# Patient Record
Sex: Female | Born: 2000 | Race: Black or African American | Hispanic: No | Smoking: Never smoker
Health system: Southern US, Community
[De-identification: ages and names within clinical notes are randomized; demographics above are authoritative.]

## PROBLEM LIST (undated history)

## (undated) DIAGNOSIS — K5900 Constipation, unspecified: Secondary | ICD-10-CM

## (undated) DIAGNOSIS — J45909 Unspecified asthma, uncomplicated: Secondary | ICD-10-CM

## (undated) HISTORY — PX: COLONOSCOPY: SHX174

## (undated) HISTORY — DX: Constipation, unspecified: K59.00

---
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
## 2019-03-03 IMAGING — US US ABDOMEN COMPLETE
1 series · 14 of 25 positions shown · non-contrast
Comparison: None.

CLINICAL DATA: Right upper quadrant pain

EXAM:
ABDOMEN ULTRASOUND COMPLETE

[Series 1: us abdomen complete · 0.12mm/px · 14 of 69 slices shown]
[im 1/69]
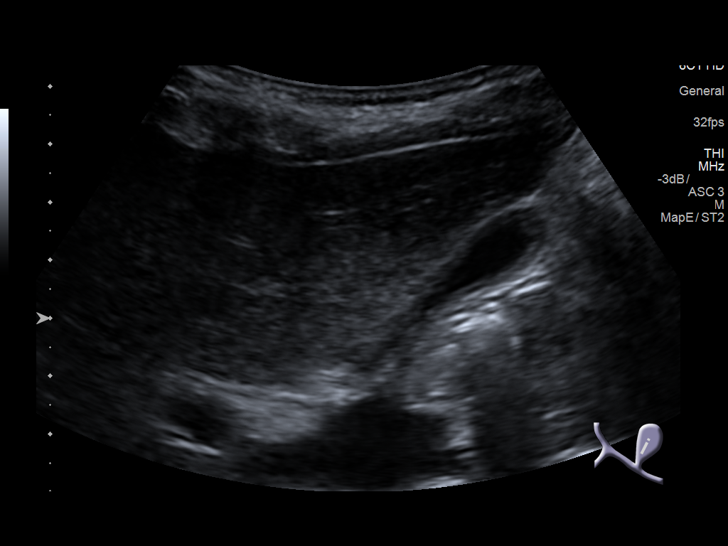
[im 6/69]
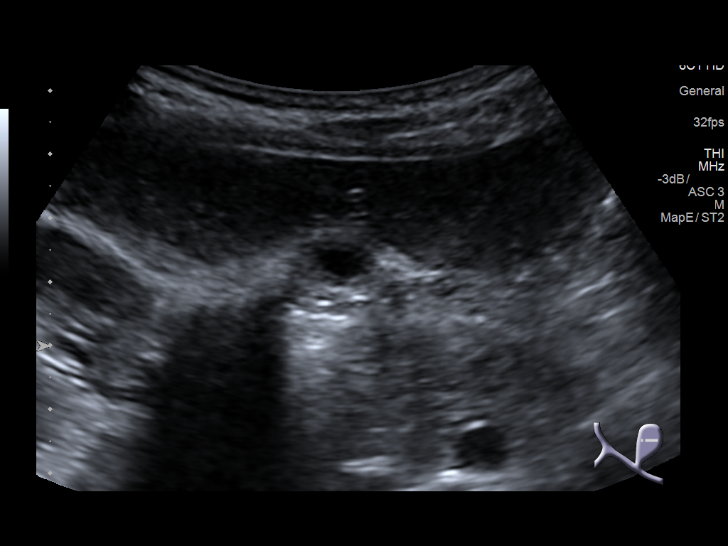
[im 12/69]
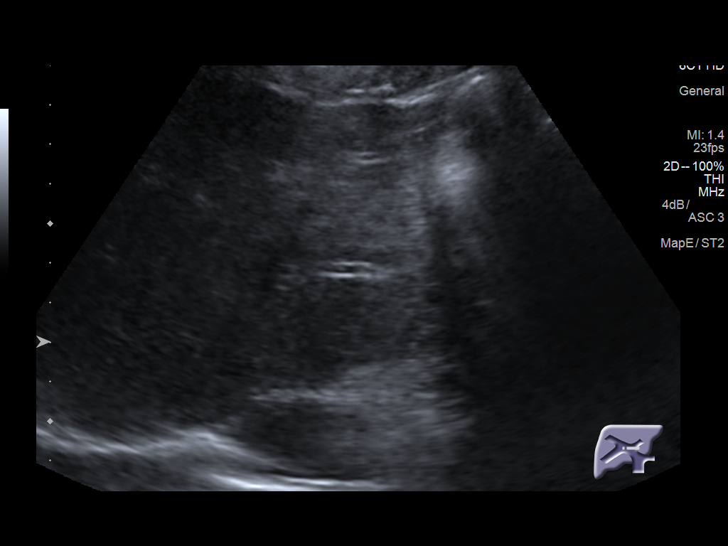
[im 18/69]
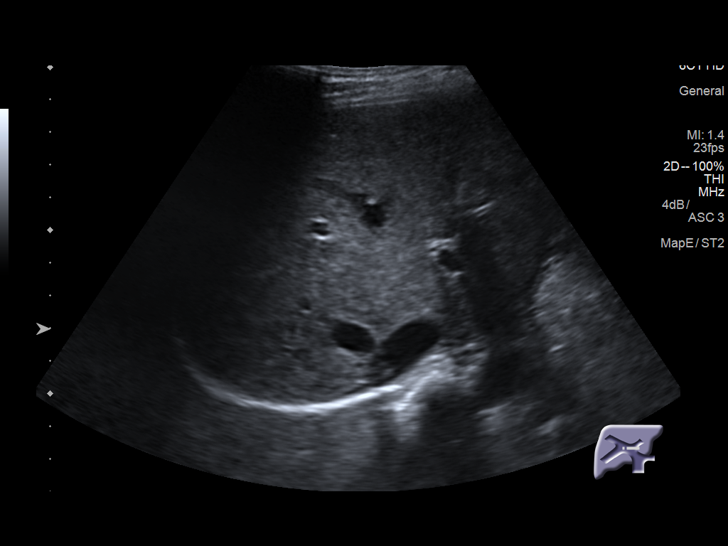
[im 23/69]
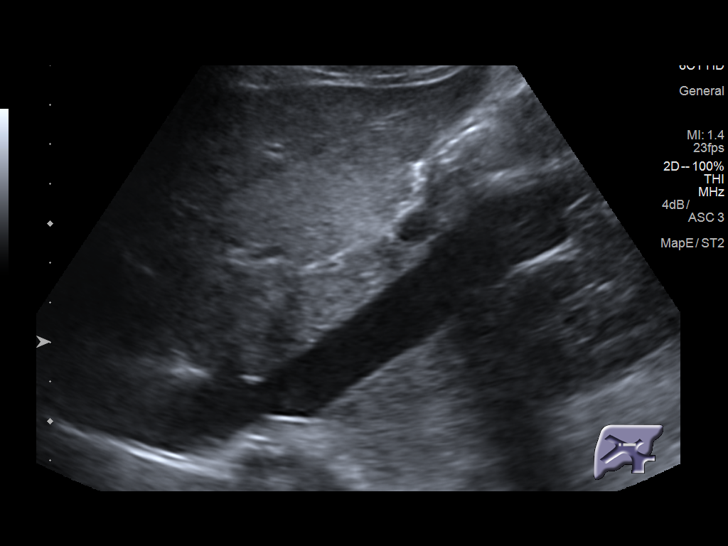
[im 26/69]
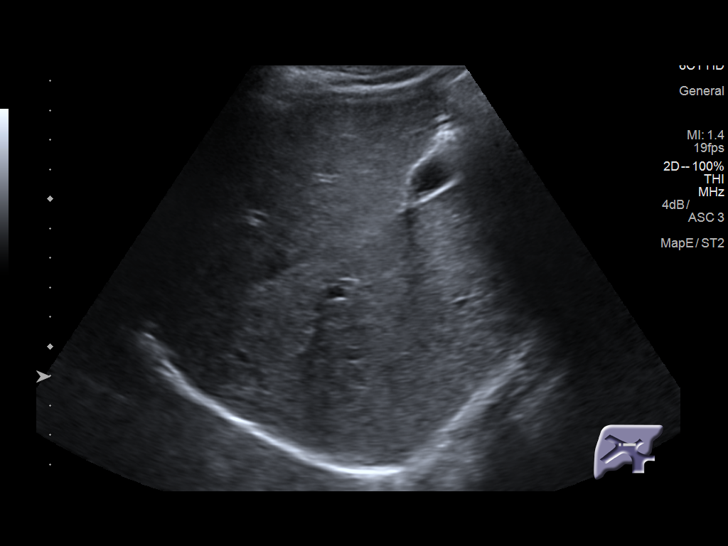
[im 32/69]
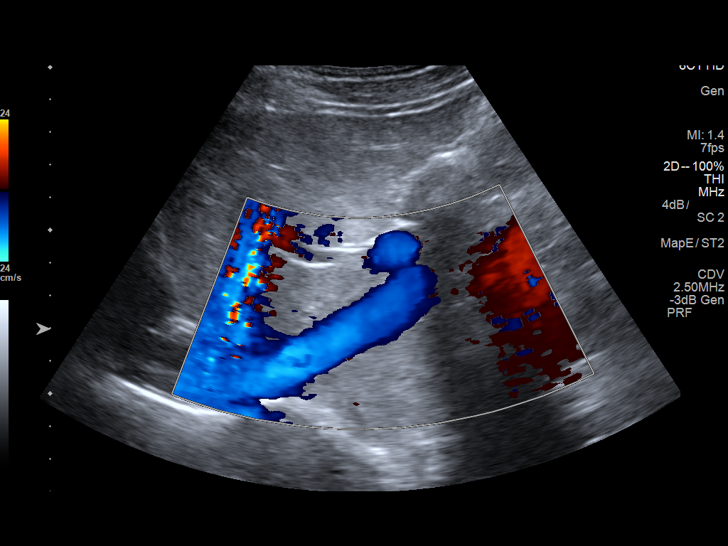
[im 37/69]
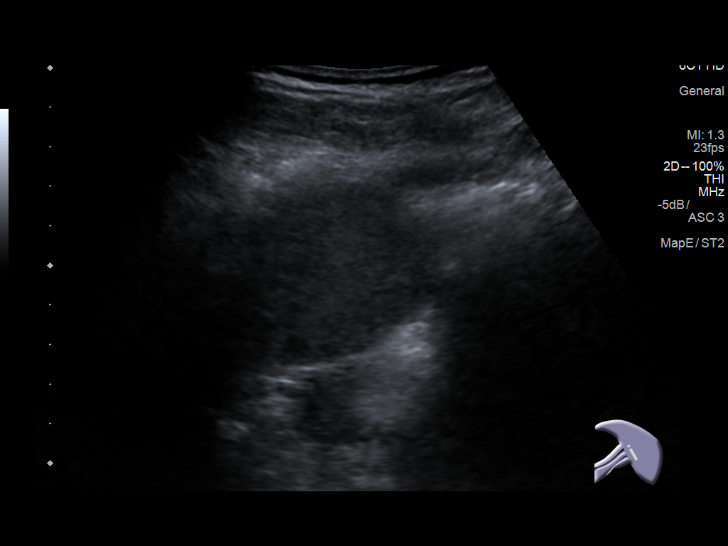
[im 43/69]
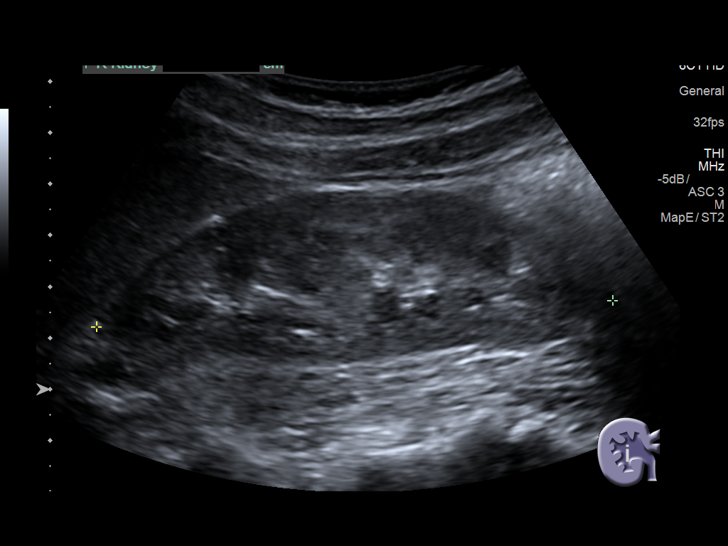
[im 46/69]
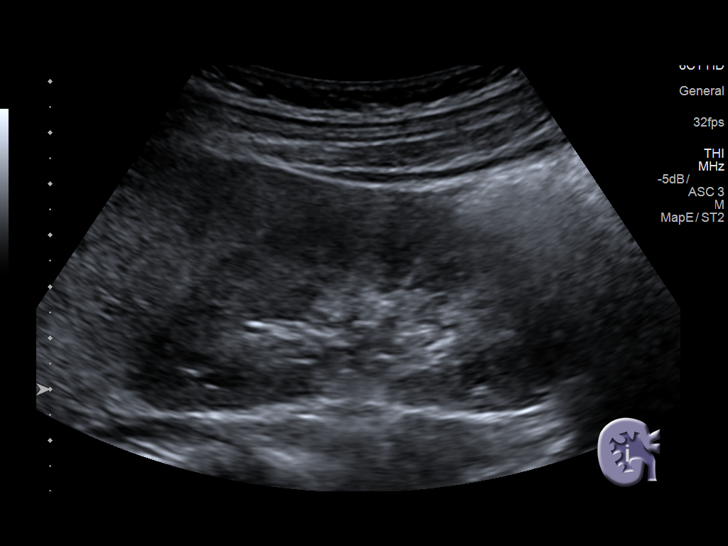
[im 52/69]
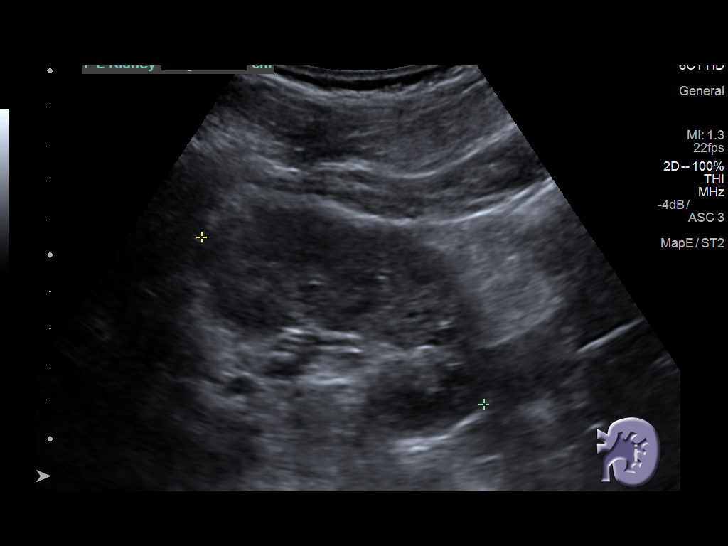
[im 57/69]
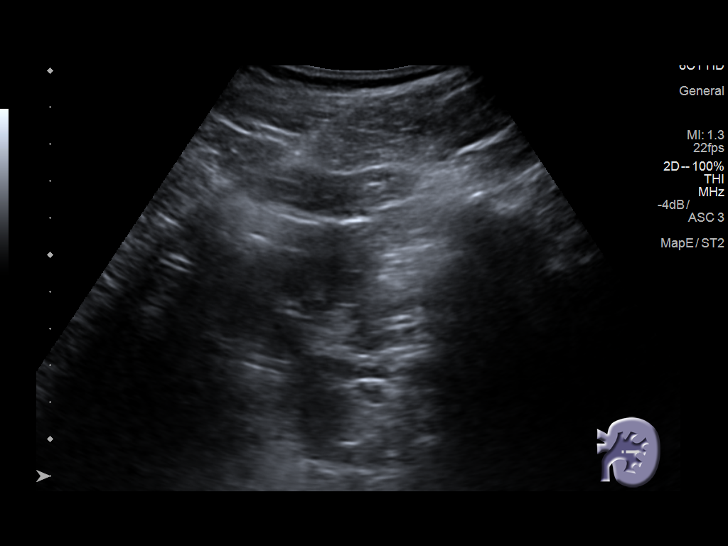
[im 63/69]
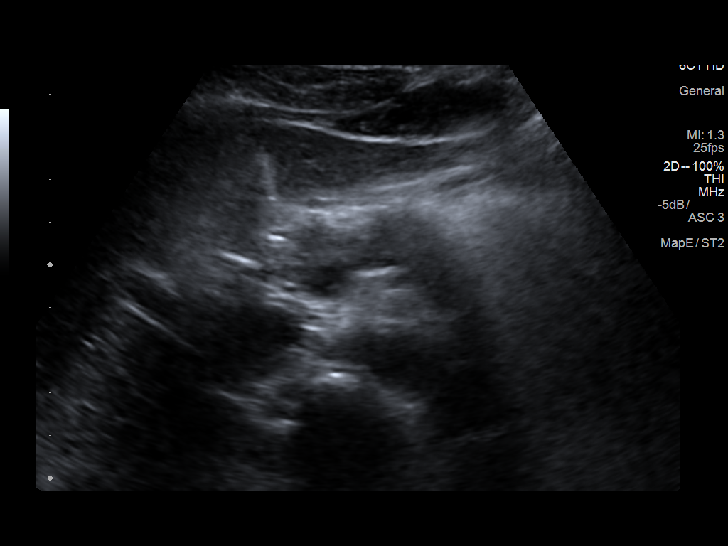
[im 69/69]
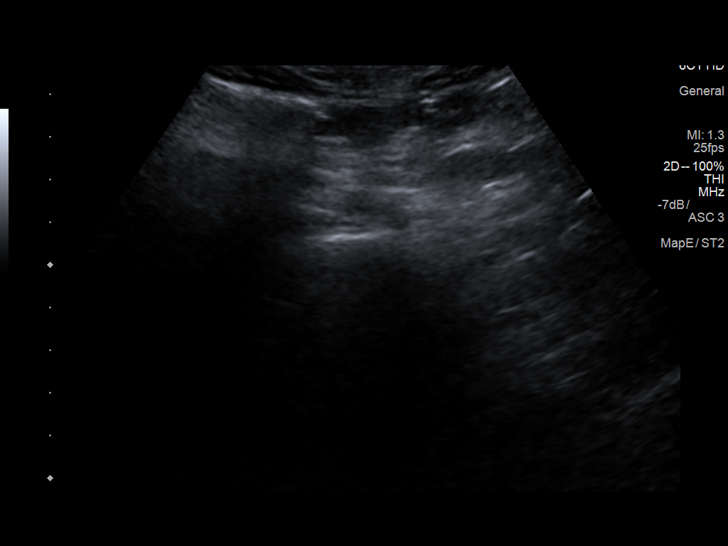

[14 of 25 positions shown; findings below may reference images not displayed]

FINDINGS: Gallbladder: No gallstones or wall thickening visualized. No
sonographic Murphy sign noted by sonographer.

Common bile duct: Diameter: 2.2 mm

Liver: No focal lesion identified. Within normal limits in
parenchymal echogenicity.

IVC: No abnormality visualized.

Pancreas: Visualized portion unremarkable.

Spleen: Size and appearance within normal limits.

Right Kidney: Length: 10.1 cm. Echogenicity within normal limits. No
mass or hydronephrosis visualized.

Left Kidney: Length: 8.9 cm. Echogenicity within normal limits. No
mass or hydronephrosis visualized.

Abdominal aorta: No aneurysm visualized.

Other findings: None.
IMPRESSION: Normal abdominal ultrasound.

## 2020-10-26 ENCOUNTER — Emergency Department (HOSPITAL_COMMUNITY)
Admission: EM | Admit: 2020-10-26 | Discharge: 2020-10-26 | Disposition: A | Discharge status: Home / Self Care | Payer: BC Managed Care – PPO | Attending: Emergency Medicine | Admitting: Emergency Medicine

## 2020-10-26 ENCOUNTER — Other Ambulatory Visit: Payer: Self-pay

## 2020-10-26 ENCOUNTER — Encounter (HOSPITAL_COMMUNITY): Payer: Self-pay

## 2020-10-26 DIAGNOSIS — L0291 Cutaneous abscess, unspecified: Secondary | ICD-10-CM

## 2020-10-26 DIAGNOSIS — J45909 Unspecified asthma, uncomplicated: Secondary | ICD-10-CM | POA: Diagnosis not present

## 2020-10-26 DIAGNOSIS — Z23 Encounter for immunization: Secondary | ICD-10-CM | POA: Diagnosis not present

## 2020-10-26 DIAGNOSIS — L02211 Cutaneous abscess of abdominal wall: Secondary | ICD-10-CM | POA: Insufficient documentation

## 2020-10-26 MED ORDER — LIDOCAINE HCL 2 % IJ SOLN
10.0000 mL | Freq: Once | INTRAMUSCULAR | Status: AC
Start: 2020-10-26 — End: 2020-10-26
  Administered 2020-10-26: 200 mg via INTRADERMAL
  Filled 2020-10-26: qty 20

## 2020-10-26 MED ORDER — TETANUS-DIPHTH-ACELL PERTUSSIS 5-2.5-18.5 LF-MCG/0.5 IM SUSY
0.5000 mL | PREFILLED_SYRINGE | Freq: Once | INTRAMUSCULAR | Status: AC
  Administered 2020-10-26: 0.5 mL via INTRAMUSCULAR
  Filled 2020-10-26: qty 0.5

## 2020-10-26 MED ORDER — LIDOCAINE HCL 2 % IJ SOLN: 20.0000 mL | Freq: Once | INTRAMUSCULAR | Status: DC

## 2020-10-26 NOTE — Discharge Instructions (Signed)
You can take Tylenol or ibuprofen for pain Keep wound clean with warm soap and water and keep bandage dry, do not submerge in water for 24 hours. Change bandage sooner if it gets dirty Return in 2 days for wound recheck Return sooner for fever, increased redness, swelling, pain, or worsening drainage

## 2020-10-26 NOTE — ED Triage Notes (Signed)
Pt has abscess to left lower abd, area is very red and swollen, tried warm compresses at home. Pt a.o

## 2020-10-26 NOTE — ED Provider Notes (Signed)
[facility] EMERGENCY DEPARTMENT Provider Note   CSN: [id number] Arrival date & time: 10/26/20  6967     History Chief Complaint  Patient presents with  . Abscess    JENEEN DOUTT is a 20 y.o. female.  HPI 20 year old female with a history of asthma presents to the ER with an abscess to her left lower abdomen.  States this has been developing over the last 2 days.  No prior history of abscesses.  Thinks is because she shaves there.  Denies any fevers or chills.  Has been using warm compresses with little relief.     Past Medical History:  Diagnosis Date  . Asthma     There are no problems to display for this patient.   History reviewed. No pertinent surgical history.   OB History   No obstetric history on file.     No family history on file.  Social History   Tobacco Use  . Smoking status: Never Smoker  . Smokeless tobacco: Never Used  Substance Use Topics  . Alcohol use: No  . Drug use: No    Home Medications Prior to Admission medications   Medication Sig Start Date End Date Taking? Authorizing Provider  albuterol (PROVENTIL HFA;VENTOLIN HFA) 108 (90 BASE) MCG/ACT inhaler Inhale into the lungs every 6 (six) hours as needed for wheezing or shortness of breath.    [provider]  pantoprazole (PROTONIX) 40 MG tablet Take 1 tablet (40 mg total) by mouth daily. 08/20/16   Adelene Amas, MD  ranitidine (ZANTAC) 150 MG capsule Take 1 capsule (150 mg total) by mouth as needed for heartburn. For breakthrough heartburn/reflux 05/13/17   Ronnell Freshwater, NP    Allergies    Patient has no known allergies.  Review of Systems   Review of Systems  Constitutional: Negative for chills and fever.  Gastrointestinal: Negative for abdominal pain.  Skin: Positive for color change and wound.    Physical Exam Updated Vital Signs BP 104/82 (BP Location: Left Arm)   Pulse 90   Temp 98.9 F (37.2 C) (Oral)   Resp 14   Ht 4\' 11"   (1.499 m)   Wt 43.1 kg   LMP 10/17/2020   SpO2 100%   BMI 19.19 kg/m   Physical Exam Abdominal:       Comments: Large 3 to 4 cm diameter abscess with some central fluctuance.  Significant mount of surrounding induration noted.  No active drainage.  Mildly warm to touch.      ED Results / Procedures / Treatments   Labs (all labs ordered are listed, but only abnormal results are displayed) Labs Reviewed - No data to display  EKG None  Radiology No results found.  Procedures .12/29/2021Incision and Drainage  Date/Time: 10/26/2020 2:29 PM Performed by: 12/24/2020, PA-C Authorized by: Mare Ferrari, PA-C   Consent:    Consent obtained:  Verbal   Consent given by:  Patient   Risks discussed:  Bleeding, incomplete drainage, pain and damage to other organs   Alternatives discussed:  No treatment Universal protocol:    Procedure explained and questions answered to patient or proxy's satisfaction: yes     Relevant documents present and verified: yes     Test results available : yes     Imaging studies available: yes     Required blood products, implants, devices, and special equipment available: yes     Site/side marked: yes     Immediately prior to  procedure, a time out was called: yes     Patient identity confirmed:  Verbally with patient Location:    Type:  Abscess   Location:  Trunk   Trunk location:  Abdomen Pre-procedure details:    Skin preparation:  Betadine and chlorhexidine Sedation:    Sedation type:  None Anesthesia:    Anesthesia method:  Local infiltration   Local anesthetic:  Lidocaine 2% WITH epi Procedure type:    Complexity:  Complex Procedure details:    Incision types:  Single straight   Incision depth:  Subcutaneous   Scalpel blade:  11   Wound management:  Probed and deloculated, irrigated with saline and extensive cleaning   Drainage:  Purulent and bloody   Drainage amount:  Moderate   Packing materials:  1/4 in gauze   Amount 1/4":  3  cm Post-procedure details:    Procedure completion:  Tolerated well, no immediate complications   (including critical care time)  Medications Ordered in ED Medications  Tdap (BOOSTRIX) injection 0.5 mL (has no administration in time range)  lidocaine (XYLOCAINE) 2 % (with pres) injection 200 mg (has no administration in time range)    ED Course  I have reviewed the triage vital signs and the nursing notes.  Pertinent labs & imaging results that were available during my care of the patient were reviewed by me and considered in my medical decision making (see chart for details).    MDM Rules/Calculators/A&P                           Patient with skin abscess amenable to incision and drainage.  Abscess was large enough to warrant packing or drain,  wound recheck in 2 days. Encouraged home warm soaks and flushing.  Mild signs of cellulitis is surrounding skin.  Will d/c to home.  No antibiotic therapy is indicated.  Final Clinical Impression(s) / ED Diagnoses Final diagnoses:  Abscess    Rx / DC Orders ED Discharge Orders    None       Leone Brand 10/26/20 1430    Jacalyn Lefevre, MD 10/26/20 1437

## 2020-10-30 ENCOUNTER — Other Ambulatory Visit: Payer: Self-pay

## 2020-10-30 ENCOUNTER — Encounter (HOSPITAL_COMMUNITY): Payer: Self-pay

## 2020-10-30 ENCOUNTER — Ambulatory Visit (HOSPITAL_COMMUNITY)
Admission: RE | Admit: 2020-10-30 | Discharge: 2020-10-30 | Disposition: A | Discharge status: Home / Self Care | Payer: BC Managed Care – PPO | Source: Ambulatory Visit | Attending: Internal Medicine | Admitting: Internal Medicine

## 2020-10-30 VITALS — BP 105/71 | HR 106 | Temp 98.2°F | Resp 16

## 2020-10-30 DIAGNOSIS — L0291 Cutaneous abscess, unspecified: Secondary | ICD-10-CM

## 2020-10-30 DIAGNOSIS — Z5189 Encounter for other specified aftercare: Secondary | ICD-10-CM | POA: Diagnosis not present

## 2020-10-30 MED ORDER — MUPIROCIN 2 % EX OINT: TOPICAL_OINTMENT | Freq: Two times a day (BID) | CUTANEOUS | 0 refills | Status: AC

## 2020-10-30 NOTE — ED Provider Notes (Signed)
[facility]    CSN: [id number] Arrival date & time: 10/30/20  1539      History   Chief Complaint Chief Complaint  Patient presents with  . Abscess    APPT    HPI Sandra Vance is a 20 y.o. female presents to urgent care today for recheck of wound.  Patient initially seen in the emergency department on 1/5 where I&D of abdominal abscess was performed.  Patient states pain has improved with decreased redness and swelling.  States she continues to have some bloody drainage.  Denies any recent fever or chills.  States she has been cleaning wound with hydrogen peroxide.   Past Medical History:  Diagnosis Date  . Asthma     There are no problems to display for this patient.   History reviewed. No pertinent surgical history.  OB History   No obstetric history on file.      Home Medications    Prior to Admission medications   Medication Sig Start Date End Date Taking? Authorizing Provider  mupirocin ointment (BACTROBAN) 2 % Apply topically 2 (two) times daily for 14 days. 10/30/20 11/13/20 Yes Rolla Etienne, NP  albuterol (PROVENTIL HFA;VENTOLIN HFA) 108 (90 BASE) MCG/ACT inhaler Inhale into the lungs every 6 (six) hours as needed for wheezing or shortness of breath.    [provider]  pantoprazole (PROTONIX) 40 MG tablet Take 1 tablet (40 mg total) by mouth daily. 08/20/16   Adelene Amas, MD  ranitidine (ZANTAC) 150 MG capsule Take 1 capsule (150 mg total) by mouth as needed for heartburn. For breakthrough heartburn/reflux 05/13/17   Ronnell Freshwater, NP    Family History History reviewed. No pertinent family history.  Social History Social History   Tobacco Use  . Smoking status: Never Smoker  . Smokeless tobacco: Never Used  Substance Use Topics  . Alcohol use: No  . Drug use: No     Allergies   Patient has no known allergies.   Review of Systems As stated in HPI otherwise negative   Physical Exam Triage Vital  Signs ED Triage Vitals  Enc Vitals Group     BP 10/30/20 1610 105/71     Pulse Rate 10/30/20 1610 (!) 106     Resp 10/30/20 1610 16     Temp 10/30/20 1610 98.2 F (36.8 C)     Temp Source 10/30/20 1610 Oral     SpO2 10/30/20 1610 100 %     Weight --      Height --      Head Circumference --      Peak Flow --      Pain Score 10/30/20 1608 0     Pain Loc --      Pain Edu? --      Excl. in GC? --    No data found.  Updated Vital Signs BP 105/71 (BP Location: Left Arm)   Pulse (!) 106   Temp 98.2 F (36.8 C) (Oral)   Resp 16   LMP 10/17/2020   SpO2 100%   Visual Acuity Right Eye Distance:   Left Eye Distance:   Bilateral Distance:    Right Eye Near:   Left Eye Near:    Bilateral Near:     Physical Exam Constitutional:      General: She is not in acute distress.    Appearance: Normal appearance. She is not ill-appearing or toxic-appearing.  Skin:    General: Skin is warm and dry.  Comments: Previous incision and drainage to abscess on left abdominal wall just below umbilicus.  No surrounding erythema, no fluctuance or induration.  Scant amount of bloody drainage with packing intact  Neurological:     Mental Status: She is alert.      UC Treatments / Results  Labs (all labs ordered are listed, but only abnormal results are displayed) Labs Reviewed - No data to display  EKG   Radiology No results found.  Procedures Procedures (including critical care time)  Medications Ordered in UC Medications - No data to display  Initial Impression / Assessment and Plan / UC Course  I have reviewed the triage vital signs and the nursing notes.  Pertinent labs & imaging results that were available during my care of the patient were reviewed by me and considered in my medical decision making (see chart for details).  Wound check Abscess, abdominal wall Healing nicely with no evidence of infection or surrounding cellulitis.  Packing removed.  No indication for  antibiotic treatment.  Patient to discontinue hydrogen peroxide and use mupirocin ointment twice daily keeping wound clean and dry.  Strict follow-up precautions discussed including fever/chills, increased swelling or erythema.  Reviewed expections re: course of current medical issues. Questions answered. Outlined signs and symptoms indicating need for more acute intervention. Pt verbalized understanding. AVS given   Final Clinical Impressions(s) / UC Diagnoses   Final diagnoses:  Wound check, abscess  Abscess     Discharge Instructions     Great job keeping area clean and dry.  Stop using the hydrogen peroxide and start using the prescribed ointment twice a day.  Please return immediately for any fever, increase redness or pain.    ED Prescriptions    Medication Sig Dispense Auth. Provider   mupirocin ointment (BACTROBAN) 2 % Apply topically 2 (two) times daily for 14 days. 22 g Rolla Etienne, NP     PDMP not reviewed this encounter.   Rolla Etienne, NP 10/30/20 [phone]

## 2020-10-30 NOTE — ED Triage Notes (Signed)
Pt presents for dressing change/ packing removal. States was seen at ED on 10/26/20 for abscess. Deneis pain but does still have drainage.

## 2020-10-30 NOTE — Discharge Instructions (Addendum)
Great job keeping area clean and dry.  Stop using the hydrogen peroxide and start using the prescribed ointment twice a day.  Please return immediately for any fever, increase redness or pain.

## 2021-02-06 ENCOUNTER — Encounter (HOSPITAL_COMMUNITY): Payer: Self-pay | Admitting: Emergency Medicine

## 2021-02-06 ENCOUNTER — Ambulatory Visit (HOSPITAL_COMMUNITY)
Admission: EM | Admit: 2021-02-06 | Discharge: 2021-02-06 | Disposition: A | Discharge status: Home / Self Care | Payer: BC Managed Care – PPO

## 2021-02-06 ENCOUNTER — Other Ambulatory Visit: Payer: Self-pay

## 2021-02-06 DIAGNOSIS — R1032 Left lower quadrant pain: Secondary | ICD-10-CM

## 2021-02-06 DIAGNOSIS — R109 Unspecified abdominal pain: Secondary | ICD-10-CM | POA: Diagnosis not present

## 2021-02-06 DIAGNOSIS — R3129 Other microscopic hematuria: Secondary | ICD-10-CM | POA: Diagnosis not present

## 2021-02-06 LAB — POCT URINALYSIS DIPSTICK, ED / UC
Bilirubin Urine: NEGATIVE
Glucose, UA: NEGATIVE mg/dL
Ketones, ur: NEGATIVE mg/dL
Leukocytes,Ua: NEGATIVE
Nitrite: NEGATIVE
Protein, ur: NEGATIVE mg/dL
Specific Gravity, Urine: 1.02 (ref 1.005–1.030)
Urobilinogen, UA: 4 mg/dL — ABNORMAL HIGH (ref 0.0–1.0)
pH: 7 (ref 5.0–8.0)

## 2021-02-06 LAB — POC URINE PREG, ED: Preg Test, Ur: NEGATIVE

## 2021-02-06 MED ORDER — KETOROLAC TROMETHAMINE 30 MG/ML IJ SOLN
INTRAMUSCULAR | Status: AC
  Filled 2021-02-06: qty 1

## 2021-02-06 MED ORDER — KETOROLAC TROMETHAMINE 30 MG/ML IJ SOLN
30.0000 mg | Freq: Once | INTRAMUSCULAR | Status: AC
  Administered 2021-02-06: 30 mg via INTRAMUSCULAR

## 2021-02-06 MED ORDER — TAMSULOSIN HCL 0.4 MG PO CAPS: 0.4000 mg | ORAL_CAPSULE | Freq: Every day | ORAL | 0 refills | Status: DC

## 2021-02-06 MED ORDER — KETOROLAC TROMETHAMINE 30 MG/ML IJ SOLN: 30.0000 mg | Freq: Once | INTRAMUSCULAR | Status: DC

## 2021-02-06 NOTE — ED Provider Notes (Signed)
[facility]    CSN: [id number] Arrival date & time: 02/06/21  1520      History   Chief Complaint Chief Complaint  Patient presents with  . Abdominal Pain    HPI Sandra Vance is a 20 y.o. female.   Patient presenting today with 1 day history of left lower quadrant pain that she states is sharp but also crampy in nature and now radiating to the left flank.  She denies fever, chills, nausea vomiting diarrhea, urinary symptoms, vaginal symptoms, sick contacts, history of similar issues.  LMP 01/18/2021.  Not taking anything for symptoms at this time.       Past Medical History:  Diagnosis Date  . Asthma     There are no problems to display for this patient.   History reviewed. No pertinent surgical history.  OB History   No obstetric history on file.      Home Medications    Prior to Admission medications   Medication Sig Start Date End Date Taking? Authorizing Provider  tamsulosin (FLOMAX) 0.4 MG CAPS capsule Take 1 capsule (0.4 mg total) by mouth daily. 02/06/21  Yes Particia Nearing, PA-C  VIENVA 0.1-20 MG-MCG tablet Take 1 tablet by mouth daily. 02/06/21  Yes [provider]  albuterol (PROVENTIL HFA;VENTOLIN HFA) 108 (90 BASE) MCG/ACT inhaler Inhale into the lungs every 6 (six) hours as needed for wheezing or shortness of breath.    [provider]  pantoprazole (PROTONIX) 40 MG tablet Take 1 tablet (40 mg total) by mouth daily. 08/20/16   Adelene Amas, MD  ranitidine (ZANTAC) 150 MG capsule Take 1 capsule (150 mg total) by mouth as needed for heartburn. For breakthrough heartburn/reflux 05/13/17   Ronnell Freshwater, NP    Family History Family History  Problem Relation Age of Onset  . Lupus Mother   . Healthy Father     Social History Social History   Tobacco Use  . Smoking status: Never Smoker  . Smokeless tobacco: Never Used  Vaping Use  . Vaping Use: Former  Substance Use Topics  . Alcohol use: No   . Drug use: No     Allergies   Patient has no known allergies.   Review of Systems Review of Systems Per HPI  Physical Exam Triage Vital Signs ED Triage Vitals  Enc Vitals Group     BP 02/06/21 1550 119/68     Pulse Rate 02/06/21 1550 87     Resp 02/06/21 1550 16     Temp 02/06/21 1550 98.3 F (36.8 C)     Temp Source 02/06/21 1550 Oral     SpO2 02/06/21 1550 100 %     Weight 02/06/21 1546 96 lb (43.5 kg)     Height 02/06/21 1546 4\' 11"  (1.499 m)     Head Circumference --      Peak Flow --      Pain Score 02/06/21 1545 7     Pain Loc --      Pain Edu? --      Excl. in GC? --    No data found.  Updated Vital Signs BP 119/68 (BP Location: Left Arm)   Pulse 87   Temp 98.3 F (36.8 C) (Oral)   Resp 16   Ht 4\' 11"  (1.499 m)   Wt 96 lb (43.5 kg)   LMP 01/18/2021 (Approximate)   SpO2 100%   BMI 19.39 kg/m   Visual Acuity Right Eye Distance:   Left Eye  Distance:   Bilateral Distance:    Right Eye Near:   Left Eye Near:    Bilateral Near:     Physical Exam Vitals and nursing note reviewed.  Constitutional:      Appearance: Normal appearance. She is not ill-appearing.  HENT:     Head: Atraumatic.     Nose: Nose normal.     Mouth/Throat:     Mouth: Mucous membranes are moist.     Pharynx: Oropharynx is clear.  Eyes:     Extraocular Movements: Extraocular movements intact.     Conjunctiva/sclera: Conjunctivae normal.  Cardiovascular:     Rate and Rhythm: Normal rate and regular rhythm.     Heart sounds: Normal heart sounds.  Pulmonary:     Effort: Pulmonary effort is normal. No respiratory distress.     Breath sounds: Normal breath sounds. No wheezing or rales.  Abdominal:     General: Bowel sounds are normal. There is no distension.     Palpations: Abdomen is soft.     Tenderness: There is no abdominal tenderness. There is no right CVA tenderness, left CVA tenderness or guarding.  Musculoskeletal:        General: Normal range of motion.      Cervical back: Normal range of motion and neck supple.  Skin:    General: Skin is warm and dry.  Neurological:     Mental Status: She is alert and oriented to person, place, and time.  Psychiatric:        Mood and Affect: Mood normal.        Thought Content: Thought content normal.        Judgment: Judgment normal.     UC Treatments / Results  Labs (all labs ordered are listed, but only abnormal results are displayed) Labs Reviewed  POCT URINALYSIS DIPSTICK, ED / UC - Abnormal; Notable for the following components:      Result Value   Hgb urine dipstick MODERATE (*)    Urobilinogen, UA 4.0 (*)    All other components within normal limits  POC URINE PREG, ED    EKG   Radiology No results found.  Procedures Procedures (including critical care time)  Medications Ordered in UC Medications  ketorolac (TORADOL) 30 MG/ML injection 30 mg (30 mg Intramuscular Given 02/06/21 1624)    Initial Impression / Assessment and Plan / UC Course  I have reviewed the triage vital signs and the nursing notes.  Pertinent labs & imaging results that were available during my care of the patient were reviewed by me and considered in my medical decision making (see chart for details).     Examined vitals today very reassuring.  UA negative for UTI but positive for moderate hemoglobin.  Given this and the sharp left flank pain do suspect kidney stone to be because of her current symptoms but discussed with patient that I could not rule out other causes without is an ultrasound or CT scan of her abdomen at this time.  Will trial IM Toradol, Flomax and push fluids and if worsening go to the ER for further evaluation.  Patient agreeable to plan.  Final Clinical Impressions(s) / UC Diagnoses   Final diagnoses:  Microscopic hematuria  Left flank pain  LLQ pain     Discharge Instructions     Go directly to the ER if your symptoms worsen at any time    ED Prescriptions    Medication Sig  Dispense Auth. Provider   tamsulosin [facility])  0.4 MG CAPS capsule Take 1 capsule (0.4 mg total) by mouth daily. 14 capsule Particia Nearing, [place]     PDMP not reviewed this encounter.   Particia Nearing, [place] 02/06/21 1723

## 2021-02-06 NOTE — ED Triage Notes (Signed)
Pt c/o of LLQ abdominal pain that radiates to left back x 2 days. Denies n/v/d.

## 2021-02-06 NOTE — Discharge Instructions (Signed)
Go directly to the ER if your symptoms worsen at any time

## 2021-02-07 ENCOUNTER — Ambulatory Visit (HOSPITAL_COMMUNITY): Payer: Self-pay

## 2021-02-25 ENCOUNTER — Other Ambulatory Visit: Payer: Self-pay

## 2021-02-25 ENCOUNTER — Emergency Department (HOSPITAL_COMMUNITY): Payer: BC Managed Care – PPO

## 2021-02-25 ENCOUNTER — Encounter (HOSPITAL_COMMUNITY): Payer: Self-pay | Admitting: Emergency Medicine

## 2021-02-25 ENCOUNTER — Emergency Department (HOSPITAL_COMMUNITY)
Admission: EM | Admit: 2021-02-25 | Discharge: 2021-02-25 | Disposition: A | Discharge status: Home / Self Care | Payer: BC Managed Care – PPO | Attending: Emergency Medicine | Admitting: Emergency Medicine

## 2021-02-25 DIAGNOSIS — R1032 Left lower quadrant pain: Secondary | ICD-10-CM | POA: Diagnosis present

## 2021-02-25 DIAGNOSIS — R3129 Other microscopic hematuria: Secondary | ICD-10-CM | POA: Diagnosis not present

## 2021-02-25 DIAGNOSIS — J45909 Unspecified asthma, uncomplicated: Secondary | ICD-10-CM | POA: Insufficient documentation

## 2021-02-25 DIAGNOSIS — K5900 Constipation, unspecified: Secondary | ICD-10-CM | POA: Diagnosis not present

## 2021-02-25 LAB — CBC
HCT: 36.6 % (ref 36.0–46.0)
Hemoglobin: 12.5 g/dL (ref 12.0–15.0)
MCH: 27.4 pg (ref 26.0–34.0)
MCHC: 34.2 g/dL (ref 30.0–36.0)
MCV: 80.3 fL (ref 80.0–100.0)
Platelets: 198 10*3/uL (ref 150–400)
RBC: 4.56 MIL/uL (ref 3.87–5.11)
RDW: 12.8 % (ref 11.5–15.5)
WBC: 10.3 10*3/uL (ref 4.0–10.5)
nRBC: 0 % (ref 0.0–0.2)

## 2021-02-25 LAB — I-STAT BETA HCG BLOOD, ED (MC, WL, AP ONLY): I-stat hCG, quantitative: <5 m[IU]/mL (ref <5)

## 2021-02-25 LAB — URINALYSIS, ROUTINE W REFLEX MICROSCOPIC
Bilirubin Urine: NEGATIVE
Glucose, UA: NEGATIVE mg/dL
Ketones, ur: NEGATIVE mg/dL
Leukocytes,Ua: NEGATIVE
Nitrite: NEGATIVE
Protein, ur: 30 mg/dL — AB
Specific Gravity, Urine: 1.026 (ref 1.005–1.030)
pH: 5 (ref 5.0–8.0)

## 2021-02-25 LAB — COMPREHENSIVE METABOLIC PANEL
ALT: 59 U/L — ABNORMAL HIGH (ref 0–44)
AST: 57 U/L — ABNORMAL HIGH (ref 15–41)
Albumin: 3.6 g/dL (ref 3.5–5.0)
Alkaline Phosphatase: 70 U/L (ref 38–126)
Anion gap: 7 (ref 5–15)
BUN: 8 mg/dL (ref 6–20)
CO2: 21 mmol/L — ABNORMAL LOW (ref 22–32)
Calcium: 8.4 mg/dL — ABNORMAL LOW (ref 8.9–10.3)
Chloride: 107 mmol/L (ref 98–111)
Creatinine, Ser: 0.78 mg/dL (ref 0.44–1.00)
GFR, Estimated: >60 mL/min (ref >60)
Glucose, Bld: 84 mg/dL (ref 70–99)
Potassium: 3.6 mmol/L (ref 3.5–5.1)
Sodium: 135 mmol/L (ref 135–145)
Total Bilirubin: 0.7 mg/dL (ref 0.3–1.2)
Total Protein: 6.8 g/dL (ref 6.5–8.1)

## 2021-02-25 LAB — LIPASE, BLOOD: Lipase: 25 U/L (ref 11–51)

## 2021-02-25 MED ORDER — ONDANSETRON HCL 4 MG/2ML IJ SOLN
4.0000 mg | Freq: Once | INTRAMUSCULAR | Status: AC
Start: 2021-02-25 — End: 2021-02-25
  Administered 2021-02-25: 4 mg via INTRAVENOUS
  Filled 2021-02-25: qty 2

## 2021-02-25 MED ORDER — ONDANSETRON 4 MG PO TBDP: 4.0000 mg | ORAL_TABLET | Freq: Three times a day (TID) | ORAL | 0 refills | Status: DC | PRN

## 2021-02-25 MED ORDER — DOCUSATE SODIUM 100 MG PO CAPS: 100.0000 mg | ORAL_CAPSULE | Freq: Two times a day (BID) | ORAL | 0 refills | Status: DC | PRN

## 2021-02-25 MED ORDER — SODIUM CHLORIDE 0.9 % IV BOLUS
500.0000 mL | Freq: Once | INTRAVENOUS | Status: AC
  Administered 2021-02-25: 500 mL via INTRAVENOUS

## 2021-02-25 NOTE — ED Provider Notes (Addendum)
[facility] EMERGENCY DEPARTMENT Provider Note   CSN: [id number] Arrival date & time: 02/25/21  3536     History Chief Complaint  Patient presents with  . Abdominal Pain  . Flank Pain    Sandra Vance is a 20 y.o. female with no pertinent past medical history that presents to the emergency department today for left flank pain.  Patient states that she has had this pain for a couple of weeks now, is in her left lower quadrant and her left flank, its sharp and crampy in nature and radiating from her left flank to her left lower quadrant.  Patient saw urgent care on 4/18 for this, she states that they gave her a IV shot of Toradol and she felt much better, states that however over the past couple of days symptoms have worsened.  At that time they did note his hemoglobin in her urine, treated her for kidney stone giving her Flomax and telling her to go to the ER if symptoms worsen.  Patient states that symptoms had worsened over the last couple of days, states that her left flank is extremely tender to palpation, causing her to be nauseous.  Has not vomited.  Denies any fevers or diarrhea. Does admit to severe constipation.  Denies any prior abdominal history, no abdominal surgeries or diseases.  Denies any urinary symptoms, no dysuria or gross hematuria.  Denies any vaginal symptoms, sick contacts.  Patient recently ended  menstrual cycle.  Has been trying ibuprofen Tylenol without much relief. No back pain, chest pain, SOB.  HPI     Past Medical History:  Diagnosis Date  . Asthma     There are no problems to display for this patient.   History reviewed. No pertinent surgical history.   OB History   No obstetric history on file.     Family History  Problem Relation Age of Onset  . Lupus Mother   . Healthy Father     Social History   Tobacco Use  . Smoking status: Never Smoker  . Smokeless tobacco: Never Used  Vaping Use  . Vaping Use: Former  Substance  Use Topics  . Alcohol use: No  . Drug use: No    Home Medications Prior to Admission medications   Medication Sig Start Date End Date Taking? Authorizing Provider  docusate sodium (COLACE) 100 MG capsule Take 1 capsule (100 mg total) by mouth 2 (two) times daily as needed for mild constipation. 02/25/21  Yes Herby Amick, PA-C  ondansetron (ZOFRAN ODT) 4 MG disintegrating tablet Take 1 tablet (4 mg total) by mouth every 8 (eight) hours as needed for nausea or vomiting. 02/25/21  Yes Takara Sermons, Donne Anon, PA-C  albuterol (PROVENTIL HFA;VENTOLIN HFA) 108 (90 BASE) MCG/ACT inhaler Inhale into the lungs every 6 (six) hours as needed for wheezing or shortness of breath.    [provider]  pantoprazole (PROTONIX) 40 MG tablet Take 1 tablet (40 mg total) by mouth daily. 08/20/16   Adelene Amas, MD  ranitidine (ZANTAC) 150 MG capsule Take 1 capsule (150 mg total) by mouth as needed for heartburn. For breakthrough heartburn/reflux 05/13/17   Ronnell Freshwater, NP  tamsulosin (FLOMAX) 0.4 MG CAPS capsule Take 1 capsule (0.4 mg total) by mouth daily. 02/06/21   Particia Nearing, PA-C  VIENVA 0.1-20 MG-MCG tablet Take 1 tablet by mouth daily. 02/06/21   [provider]    Allergies    Patient has no known allergies.  Review of  Systems   Review of Systems  Constitutional: Negative for chills, diaphoresis, fatigue and fever.  HENT: Negative for congestion, sore throat and trouble swallowing.   Eyes: Negative for pain and visual disturbance.  Respiratory: Negative for cough, shortness of breath and wheezing.   Cardiovascular: Negative for chest pain, palpitations and leg swelling.  Gastrointestinal: Positive for nausea. Negative for abdominal distention, abdominal pain, diarrhea and vomiting.  Genitourinary: Positive for flank pain. Negative for difficulty urinating, dyspareunia, dysuria, frequency, genital sores, hematuria, menstrual problem, pelvic pain, urgency, vaginal  bleeding and vaginal discharge.  Musculoskeletal: Negative for back pain, neck pain and neck stiffness.  Skin: Negative for pallor.  Neurological: Negative for dizziness, speech difficulty, weakness and headaches.  Psychiatric/Behavioral: Negative for confusion.    Physical Exam Updated Vital Signs BP 105/73 (BP Location: Left Arm)   Pulse 91   Temp 98.1 F (36.7 C) (Oral)   Resp 16   Ht 4\' 11"  (1.499 m)   Wt 43.5 kg   LMP 02/14/2021   SpO2 100%   BMI 19.39 kg/m   Physical Exam Constitutional:      General: She is not in acute distress.    Appearance: Normal appearance. She is not ill-appearing, toxic-appearing or diaphoretic.  HENT:     Mouth/Throat:     Mouth: Mucous membranes are moist.     Pharynx: Oropharynx is clear.  Eyes:     General: No scleral icterus.    Extraocular Movements: Extraocular movements intact.     Pupils: Pupils are equal, round, and reactive to light.  Cardiovascular:     Rate and Rhythm: Normal rate and regular rhythm.     Pulses: Normal pulses.     Heart sounds: Normal heart sounds.  Pulmonary:     Effort: Pulmonary effort is normal. No respiratory distress.     Breath sounds: Normal breath sounds. No stridor. No wheezing, rhonchi or rales.  Chest:     Chest wall: No tenderness.  Abdominal:     General: Abdomen is flat. There is no distension.     Palpations: Abdomen is soft.     Tenderness: There is abdominal tenderness in the left lower quadrant. There is no left CVA tenderness, guarding or rebound.     Comments: L flank tender  Genitourinary:    Comments: deferred  Musculoskeletal:        General: No swelling or tenderness. Normal range of motion.     Cervical back: Normal range of motion and neck supple. No rigidity.     Right lower leg: No edema.     Left lower leg: No edema.  Skin:    General: Skin is warm and dry.     Capillary Refill: Capillary refill takes less than 2 seconds.     Coloration: Skin is not pale.   Neurological:     General: No focal deficit present.     Mental Status: She is alert and oriented to person, place, and time.  Psychiatric:        Mood and Affect: Mood normal.        Behavior: Behavior normal.     ED Results / Procedures / Treatments   Labs (all labs ordered are listed, but only abnormal results are displayed) Labs Reviewed  COMPREHENSIVE METABOLIC PANEL - Abnormal; Notable for the following components:      Result Value   CO2 21 (*)    Calcium 8.4 (*)    AST 57 (*)    ALT 59 (*)  All other components within normal limits  URINALYSIS, ROUTINE W REFLEX MICROSCOPIC - Abnormal; Notable for the following components:   Color, Urine AMBER (*)    APPearance HAZY (*)    Hgb urine dipstick MODERATE (*)    Protein, ur 30 (*)    Bacteria, UA FEW (*)    All other components within normal limits  LIPASE, BLOOD  CBC  I-STAT BETA HCG BLOOD, ED ([facility], [facility], [facility] ONLY)    EKG None  Radiology CT Renal Stone Study  Result Date: 02/25/2021 CLINICAL DATA:  Left flank pain with swelling beginning 3 weeks ago. Pain went away but returned. EXAM: CT ABDOMEN AND PELVIS WITHOUT CONTRAST TECHNIQUE: Multidetector CT imaging of the abdomen and pelvis was performed following the standard protocol without IV contrast. COMPARISON:  None. FINDINGS: Lower chest: Clear lung bases.  Heart normal in size. Hepatobiliary: Choose 1 Pancreas: Unremarkable. No pancreatic ductal dilatation or surrounding inflammatory changes. Spleen: Normal in size without focal abnormality. Adrenals/Urinary Tract: No adrenal masses. Kidneys normal in size, orientation and position. No renal masses, stones or hydronephrosis. Normal ureters. Bladder decompressed, otherwise unremarkable. Stomach/Bowel: Normal stomach and small bowel. Colon mildly distended with moderately increased stool. Increased stool distends the rectum. No colonic or rectal wall thickening. No evidence of inflammation. Normal appendix visualized.  Vascular/Lymphatic: No significant vascular findings are present. No enlarged abdominal or pelvic lymph nodes. Reproductive: Uterus and bilateral adnexa are unremarkable. Other: No abdominal wall hernia or abnormality. No abdominopelvic ascites. Musculoskeletal: No acute or significant osseous findings. IMPRESSION: 1. No acute findings. No renal or ureteral stones or obstructive uropathy. 2. Moderate increased stool in the colon and rectum. No bowel obstruction or inflammation. No other abnormalities. Electronically Signed   By: Amie Portlandavid  Ormond M.D.   On: 02/25/2021 11:07    Procedures Procedures   Medications Ordered in ED Medications  sodium chloride 0.9 % bolus 500 mL (0 mLs Intravenous Stopped 02/25/21 1100)  ondansetron (ZOFRAN) injection 4 mg (4 mg Intravenous Given 02/25/21 1040)    ED Course  I have reviewed the triage vital signs and the nursing notes.  Pertinent labs & imaging results that were available during my care of the patient were reviewed by me and considered in my medical decision making (see chart for details).    MDM Rules/Calculators/A&P                           Quincy SimmondsZanaya Q Vance is a 20 y.o. female with no pertinent past medical history that presents to the emergency department today for left flank pain.  Differential to include kidney pathology, less likely to be acute GYN issues since this is been going on for 3 weeks.  Initial interventions include fluids and Zofran, patient wants to hold off on pain medications at this time.  Will obtain basic work-up including CT renal study.  Work-up today shows moderate hemoglobin in urine again with some protein.  Does also show some slightly elevated AST and ALT.  CBC unremarkable, no anemia or leukocytosis.  CT renal did not show any evidence of urethral stones or nephrolithiasis.  However did show moderate stool burden.  Patient states that she has not had a bowel movement in over a week, states that she normally only has 1 bowel  movement per week.   I think with these findings, symptoms are most likely due to constipation has been going on for over 3 weeks. Could have also passed kidney stone.  Did offer pelvic exam for lower quadrant pain, however patient rejected.  Do not suspect acute pelvic etiology since this is been going on for a while, patient does not have any concerns for STDs.  Did express to patient that I want her to follow-up with urology since third  time she has had blood in her urine, patient symptomatic treatment discussed in regards to constipation.  I also expressed to patient that I would want her LFTs to be rechecked with her primary care. NO RUQ pain. Plan discussed with patient and mom who are reliable and agreeable.  Patient states that she feels better, is ready for discharge.  Doubt need for further emergent work up at this time. I explained the diagnosis and have given explicit precautions to return to the ER including for any other new or worsening symptoms. The patient understands and accepts the medical plan as it's been dictated and I have answered their questions. Discharge instructions concerning home care and prescriptions have been given. The patient is STABLE and is discharged to home in good condition.  Final Clinical Impression(s) / ED Diagnoses Final diagnoses:  Constipation, unspecified constipation type  Microscopic hematuria    Rx / DC Orders ED Discharge Orders         Ordered    docusate sodium (COLACE) 100 MG capsule  2 times daily PRN        02/25/21 1137    ondansetron (ZOFRAN ODT) 4 MG disintegrating tablet  Every 8 hours PRN        02/25/21 1137              Farrel Gordon, PA-C 02/25/21 1435    Gwyneth Sprout, MD 02/26/21 [phone]

## 2021-02-25 NOTE — ED Triage Notes (Signed)
Pt states she was seen at [facility] 3 weeks ago and told she may have kidney stones.  States pain went away but has returned.  C/o L sided abd pain and swelling and L flank pain with nausea and vomiting.

## 2021-02-25 NOTE — Discharge Instructions (Signed)
  You were evaluated in the Emergency Department and after careful evaluation, we did not find any emergent condition requiring admission or further testing in the hospital.   Your exam/testing today was overall reassuring.  Symptoms seem to be due to constipation.  Please use the attached instructions and below instructions for constipation.  Make sure to stay hydrated.  I also want you to follow-up with urology for your constant blood in your urine.  The information is provided.  In addition I want to follow-up with your primary care since your liver enzymes were slightly elevated today to have these rechecked.  If you have any new or worsening concerning symptoms he is back to the ER. Please return to the Emergency Department if you experience any worsening of your condition.  Thank you for allowing [place] to be a part of your care. Please speak to your pharmacist about any new medications prescribed today in regards to side effects or interactions with other medications.      For your bowels, you may take MiraLAX 17 g in 8 to 10 ounces of fluid 2-3 times a day, Colace which I prescribed, and if you are still not making bowel movements you may take 1 Dulcolax, Ex-Lax, or Senokot tablet at night to try to stimulate a bowel movement.

## 2021-02-25 NOTE — ED Notes (Signed)
Reviewed discharge instructions with patient and mother. Follow-up care and medications reviewed. Patient and mother verbalized understanding. Patient A&Ox4, VSS, and ambulatory with steady gait upon discharge.  

## 2021-04-07 ENCOUNTER — Other Ambulatory Visit: Payer: Self-pay

## 2021-04-07 ENCOUNTER — Ambulatory Visit (INDEPENDENT_AMBULATORY_CARE_PROVIDER_SITE_OTHER): Payer: BC Managed Care – PPO | Admitting: Internal Medicine

## 2021-04-07 ENCOUNTER — Encounter: Payer: Self-pay | Admitting: Internal Medicine

## 2021-04-07 VITALS — BP 103/70 | HR 85 | Ht 59.75 in | Wt 99.2 lb

## 2021-04-07 DIAGNOSIS — R634 Abnormal weight loss: Secondary | ICD-10-CM | POA: Diagnosis not present

## 2021-04-07 DIAGNOSIS — R350 Frequency of micturition: Secondary | ICD-10-CM

## 2021-04-07 DIAGNOSIS — R768 Other specified abnormal immunological findings in serum: Secondary | ICD-10-CM | POA: Insufficient documentation

## 2021-04-07 DIAGNOSIS — R21 Rash and other nonspecific skin eruption: Secondary | ICD-10-CM

## 2021-04-07 DIAGNOSIS — R7689 Other specified abnormal immunological findings in serum: Secondary | ICD-10-CM | POA: Insufficient documentation

## 2021-04-07 DIAGNOSIS — R591 Generalized enlarged lymph nodes: Secondary | ICD-10-CM

## 2021-04-07 DIAGNOSIS — M2550 Pain in unspecified joint: Secondary | ICD-10-CM

## 2021-04-07 NOTE — Patient Instructions (Signed)
Several labs checked for ollow up  Antinuclear Antibody Test Why am I having this test? This is a test that is used to help diagnose systemic lupus erythematosus (SLE) and other autoimmune diseases. An autoimmune disease is a disease in which the body's own defense (immune)system attacks its organs. What is being tested? This test checks for antinuclear antibodies (ANA) in the blood. The presence of ANA is associated with several autoimmune diseases. It is seen in almost allpatients with lupus. What kind of sample is taken?  A blood sample is required for this test. It is usually collected by insertinga needle into a blood vessel. How are the results reported? Your test results will be reported as either positive or negative. A false-positive result can occur. A false positive is incorrect because itmeans that a condition is present when it is not. What do the results mean? A positive test result may mean that you have: Lupus. Other autoimmune diseases, such as rheumatoid arthritis, scleroderma, or Sjgren syndrome. Conditions that may cause a false-positive result include: Liver dysfunction. Myasthenia gravis. Infectious mononucleosis. Talk with your health care provider about what your results mean. Questions to ask your health care provider Ask your health care provider, or the department that is doing the test: When will my results be ready? How will I get my results? What are my treatment options? What other tests do I need? What are my next steps? Summary This is a test that is used to help diagnose systemic lupus erythematosus (SLE) and other autoimmune diseases. An autoimmune disease is a disease in which the body's own defense (immune)system attacks the body. This test checks for antinuclear antibodies (ANA) in the blood. The presence of ANA is associated with several autoimmune diseases. It is seen in almost all patients with lupus. Your test results will be reported as  either positive or negative. Talk with your health care provider about what your results mean.  Anti-DNA Antibody Test Why am I having this test? The anti-DNA antibody test helps with the diagnosis and follow-up of systemic lupus erythematosus (SLE). It is also used to monitor treatment of thiscondition as the antibody decreases with successful therapy. What is being tested? This test measures the amount of anti-DNA antibody in the blood. This antibody is found in 65-80% of patients with active SLE. This antibody is not as commonin patients who have other diseases. What kind of sample is taken?  A blood sample is required for this test. It is usually collected by insertinga needle into a blood vessel. How are the results reported? Your test results will be reported as a value. Your test results may also be reported as positive, intermediate, or negative. Your health care provider will compare your results to normal ranges that were established after testing a large group of people (reference values). Reference values may vary among labs and hospitals. For this test, common reference values are: Positive: 10 or more international units/mL. Intermediate: 5-9 international units/mL. Negative: Less than 5 international units/mL. What do the results mean? Positive results, which are associated with results that are higher than the reference values, may indicate: Autoimmune disorders such as SLE. Infectious mononucleosis. Chronic liver conditions. Intermediate results mean that the anti-DNA antibody levels are higher thannormal, but not high enough to be considered positive. Negative results mean that you do not have the anti-DNA antibody that isassociated with these conditions. Talk with your health care provider about what your results mean. Questions to ask your health care provider Ask  your health care provider, or the department that is doing the test: When will my results be ready? How will  I get my results? What are my treatment options? What other tests do I need? What are my next steps? Summary The anti-DNA antibody test helps with the diagnosis and follow-up of systemic lupus erythematosus (SLE). It is also used to monitor treatment of this condition as the antibody decreases with successful therapy. This test measures the amount of anti-DNA antibody in the blood. Elevated levels of anti-DNA antibody can be seen in patients with SLE and certain other conditions.  Complement Assay Test Why am I having this test? Complement refers to a group of proteins that are part of the body's disease-fighting system (immune system). A complement assay test provides information about some or all of these proteins. You may have this test: To diagnose a lack, or deficiency, of certain complement proteins. Deficiencies can be passed from parent to child (inherited). To monitor an infection or autoimmune disease. If you have unexplained inflammation or swelling (edema). If you have bacterial infections again and again. What is being tested? This test can be used to measure: Total complement. This is the total number of protein complements in your blood. The number of each kind of complement in your blood. The nine main kinds of complement are labeled C1 through C9. Some of these complements, such as C3 and C4, are especially important and have many functions in the body. Depending on why you are having the test, your health care provider may test your total complement or only some individual complements, such as C3 and C4. The total complement assay test may be done before individual complements aretested. What kind of sample is taken?  A blood sample is required for this test. It is usually collected by insertinga needle into a blood vessel. Tell a health care provider about: Any allergies you have. All medicines you are taking, including vitamins, herbs, eye drops, creams, and  over-the-counter medicines. Any blood disorders you have. Any surgeries you have had. Any medical conditions you have. Whether you are pregnant or may be pregnant. How are the results reported? Your results will be reported as a value that tells you how much complement is in your blood. This will be given as units per milliliter of blood (units/mL) or as milligrams per deciliter of blood (mg/dL). Your results may be reportedas total complement, or as individual complements, or both. Your health care provider will compare your results to normal ranges that were established after testing a large group of people (reference ranges). Reference ranges may vary among labs and hospitals. For this test, reference ranges for some of the most commonly measured complement assays may be: Total complement: 30-75 units/mL. C2: 1-4 mg/dL. C3: 75-175 mg/dL. C4: 22-45 units/mL. What do the results mean? Results within reference ranges are considered normal, which means you have anormal amount of complement in your blood. Results that are higher than the reference ranges may be caused by: Inflammatory disease. Heart attack. Cancer. Complement deficiencies, or results lower than the reference ranges, may be caused by: Certain inherited conditions. Autoimmune disease. Certain liver diseases. Malnutrition. Certain types of anemia that result in breakdown of red blood cells (hemolytic anemia). Talk with your health care provider about what your results mean. Questions to ask your health care provider Ask your health care provider, or the department that is doing the test: When will my results be ready? How will I get my results? What  are my treatment options? What other tests do I need? What are my next steps? Summary Complement refers to a group of proteins that are part of the body's disease-fighting system (immune system). A complement assay test can provide information about some or all of these  proteins. You may have a complement assay test to help diagnose a complement deficiency, and to monitor some infections or autoimmune disease. Talk with your health care provider about what your results mean.  Urine Protein Test Why am I having this test? A urine protein test may be used to check the health of your kidneys and to look for kidney damage or kidney disease. The test may be done as part of a normal screening or if you have a condition that affects your kidneys, such as: Diabetes. High blood pressure (hypertension). Urinary tract infection. Problems resulting from pregnancy, including preeclampsia. Your health care provider may also do this test if you have symptoms of kidney disease. These can include: Fluid retention, also called edema. Tiredness, or fatigue. Nausea. What is being tested? This test measures the amount of protein in your urine. Your blood contains proteins. When your blood is filtered by your kidneys, most of the proteins should be returned to your blood and there should be no protein or very little protein in your urine. If you have a large amount of protein in your urine (proteinuria), your kidneys may not be working as well as they should. What kind of sample is taken? This test requires a urine sample. Two methods may be used for the test: A dipstick test is often the first step in a urine protein test. After you urinate into a germ-free (sterile) cup, your health care provider will dip a test strip into your sample. The health care provider can tell right away if there is protein in your urine. Your health care provider may do a 24-hour urine test if the dipstick test shows that there is a large amount of protein in your urine. This test measures the amount of protein that is released in your urine over a 24-hour period. You may also have other tests to find out the types of proteins you have inyour urine. How do I collect samples at home?  If your health  care provider wants to do a 24-hour urine test, you may be asked to collect urine samples at home over a 24-hour period. Follow instructionsfrom a health care provider about how to collect the samples. When collecting a urine sample at home, make sure you: Use supplies and instructions that you received from the lab. Collect urine only in the sterile cup that you received from the lab. Do not let any toilet paper or stool (feces) get into the cup. Refrigerate the sample or keep it on ice until you can return it to the lab. Return the sample(s) to the lab as instructed. Tell a health care provider about: All medicines you are taking, including vitamins, herbs, eye drops, creams, and over-the-counter medicines. Any medical conditions you have. Some conditions can cause you to have protein in your urine. Whether you have had a radiology scan with contrast dye. Whether you have exercised heavily in the last few days. Whether you are pregnant or may be pregnant. How are the results reported? The result of the urine dipstick test for proteins will be reported as either positive or negative. A negative result means no protein or a small amount of protein was found in your urine. A positive  result means a large amount of protein was found in your urine. The result of the 24-hour urine test will be reported as a value that indicates the amount of protein in your urine. Your health care provider will compare your results to normal ranges that were established after testing a large group of people (reference ranges). Reference ranges may vary among labs and hospitals. For someone at rest, 50-80 mg per 24 hours is considered normal. For someone exercising, 50-250 mgper 24 hours is considered normal. What do the results mean? Many conditions can cause a level of protein in your urine that is higher than normal, such as: Dehydration. Doing exercise that takes a lot of effort (is vigorous). Urinary tract  infection. High blood pressure, heart failure, or diabetes. Kidney disease. Bladder cancer. Preeclampsia, in pregnant women. Talk with your health care provider about what your results mean. Questions to ask your health care provider Ask your health care provider, or the department that is doing the test: When will my results be ready? How will I get my results? What are my treatment options? What other tests do I need? What are my next steps? Summary The urine protein test is often used to screen for kidney disease. This test measures the amount of protein in your urine. If you have a large amount of protein in your urine (proteinuria), your kidneys may not be working as well as they should. Your health care provider may do a 24-hour urine test if the dipstick test shows that there is a large amount of protein in your urine. Talk with your health care provider about what your results mean. This information is not intended to replace advice given to you by your health care provider. Make sure you discuss any questions you have with your healthcare provider. Document Revised: 06/10/2020 Document Reviewed: 01/01/2020 Elsevier Patient Education  2022 [employer].

## 2021-04-07 NOTE — Progress Notes (Signed)
Office Visit Note  Patient: Sandra Vance             Date of Birth: 2001/04/02           MRN: [id number]             PCP: [name], MD Referring: [name], MD Visit Date: 04/07/2021  Subjective:  New Patient (Initial Visit) (Family history of lupus. Patient complains of generalized joint pain and swelling. )   History of Present Illness: Sandra Vance is a 20 y.o. female here for evaluation of positive ANA and symptoms with fatigue, arthralgia, swelling, GI symptoms with weight loss, urinary frequency, and alopecia. She was previously evaluated by pediatric rheumatology without active autoimmune disease identified associated with significantly positive ANA titer. Her joint pains are chronic since years ago but has noticed more recently some worsening with pain particularly in bilateral hands, knees, and feet. She notices pain and sometimes swelling more often in her right thumb and in the bilateral feet extending up to the ankles. She often notices this after a day of working. She takes ibuprofen sometimes as needed with a benefit. She reports skin rash on the back of her neck with an area of erythema and hair thinning identified in PCP office as eczema. She also notices intermittent rash on her arms bilaterally she describes as small red spots lasting for up to several days. These seem to be sensitive to heat and do not leave residual discoloration. She has chronic GI problems with early satiety, nausea and vomiting with eating full meals, inability to gain any weight. This was evaluated with gastroenterology years ago without any specific diagnosis or treatment apparently. Urinary frequency is ongoing since around April. She reports having to urinate about 5 times at night with small amounts of volume. She has had microscopic hematuria on urine samples and suspicion for kidney stones, with some flank pain previously but this pain improved without change in hematuria and frequency.    Labs reviewed ANA 1:80 speckled RF neg CCP neg  02/2016 ANA 1:640 speckled SSA neg SSB neg dsDNA neg Smith neg Complements C3, C4 wnl  Activities of Daily Living:  Patient reports morning stiffness for 1-2 hours.   Patient Reports nocturnal pain.  Difficulty dressing/grooming: Denies Difficulty climbing stairs: Denies Difficulty getting out of chair: Denies Difficulty using hands for taps, buttons, cutlery, and/or writing: Reports  Review of Systems  Constitutional:  Positive for fatigue.  HENT:  Negative for mouth sores, mouth dryness and nose dryness.   Eyes:  Positive for itching and visual disturbance. Negative for pain and dryness.  Respiratory:  Positive for shortness of breath and difficulty breathing. Negative for cough and hemoptysis.   Cardiovascular:  Positive for swelling in legs/feet. Negative for chest pain and palpitations.  Gastrointestinal:  Positive for abdominal pain, constipation and diarrhea. Negative for blood in stool.  Endocrine: Positive for increased urination.  Genitourinary:  Positive for hematuria. Negative for painful urination.  Musculoskeletal:  Positive for joint pain, joint pain, joint swelling and morning stiffness. Negative for myalgias, muscle weakness, muscle tenderness and myalgias.  Skin:  Positive for color change and rash. Negative for redness.  Allergic/Immunologic: Negative for susceptible to infections.  Neurological:  Positive for dizziness, numbness, headaches and weakness. Negative for memory loss.  Hematological:  Positive for swollen glands.  Psychiatric/Behavioral:  Positive for confusion and sleep disturbance.    PMFS History:  Patient Active Problem List   Diagnosis Date Noted   Positive  ANA (antinuclear antibody) 04/07/2021   Arthralgia 04/07/2021   Weight loss 04/07/2021   Lymphadenopathy of head and neck 04/07/2021   Rash 04/07/2021   Urinary frequency 04/07/2021     Past Medical History:  Diagnosis Date    Asthma     Family History  Problem Relation Age of Onset   Lupus Mother    Asthma Mother    Healthy Father    Autoimmune disease Sister    Asthma Sister    ADD / ADHD Sister    ADD / ADHD Sister    Asthma Sister    Asthma Brother    Lupus Paternal Aunt    Fibromyalgia Paternal Aunt    Sickle cell anemia Maternal Grandmother    Diabetes Maternal Grandfather    History reviewed. No pertinent surgical history. Social History   Social History Narrative   Not on file   Immunization History  Administered Date(s) Administered   Tdap 10/26/2020     Objective: Vital Signs: BP 103/70 (BP Location: Right Arm, Patient Position: Sitting, Cuff Size: Normal)   Pulse 85   Ht 4' 11.75" (1.518 m)   Wt 99 lb 3.2 oz (45 kg)   LMP 03/14/2021   BMI 19.54 kg/m    Physical Exam HENT:     Right Ear: External ear normal.     Left Ear: External ear normal.     Mouth/Throat:     Mouth: Mucous membranes are moist.     Pharynx: Oropharynx is clear.  Eyes:     Conjunctiva/sclera: Conjunctivae normal.  Neck:     Comments: Palpable left cervical lymph node, mobile nontender <1cm diameter Cardiovascular:     Rate and Rhythm: Normal rate and regular rhythm.  Pulmonary:     Effort: Pulmonary effort is normal.     Breath sounds: Normal breath sounds.  Skin:    General: Skin is warm and dry.     Comments: Small patch of mildly hypopigmented and erythematous skin on back of neck at hairline  Neurological:     General: No focal deficit present.     Mental Status: She is alert.     Musculoskeletal Exam:  Neck full ROM no tenderness Shoulders full ROM no tenderness or swelling Elbows full ROM no tenderness or swelling Wrists full ROM no tenderness or swelling Fingers right 1st MCP hyperextensibility no palpable synovitis and normal appearance on ultrasound,  No paraspinal tenderness to palpation over upper and lower back Hip normal internal and external rotation without pain, no tenderness  to lateral hip palpation Knees full ROM no tenderness or swelling Ankles full ROM no tenderness or swelling MTPs full ROM no tenderness or swelling    Investigation: No additional findings.  Imaging: No results found.  Recent Labs: Lab Results  Component Value Date   WBC 10.3 02/25/2021   HGB 12.5 02/25/2021   PLT 198 02/25/2021   NA 135 02/25/2021   K 3.6 02/25/2021   CL 107 02/25/2021   CO2 21 (L) 02/25/2021   GLUCOSE 84 02/25/2021   BUN 8 02/25/2021   CREATININE 0.78 02/25/2021   BILITOT 0.7 02/25/2021   ALKPHOS 70 02/25/2021   AST 57 (H) 02/25/2021   ALT 59 (H) 02/25/2021   PROT 6.8 02/25/2021   ALBUMIN 3.6 02/25/2021   CALCIUM 8.4 (L) 02/25/2021   GFRAA NOT CALCULATED 05/13/2017    Speciality Comments: No specialty comments available.  Procedures:  No procedures performed Allergies: Patient has no known allergies.   Assessment /  Plan:     Visit Diagnoses: Positive ANA (antinuclear antibody)  Arthralgia, unspecified joint Lymphadenopathy of head and neck Rash - Plan: Sedimentation rate, C-reactive protein, RNP Antibody, Anti-scleroderma antibody, Anti-Smith antibody, Sjogrens syndrome-A extractable nuclear antibody, Sjogrens syndrome-B extractable nuclear antibody, Anti-DNA antibody, double-stranded, C3 and C4, Centromere Antibodies  Numerous complaints and positive ANA since at least 5 years although no specific clinical signs are seen on exam today for active systemic connective tissue disease. Describes arthralgias with occasional swelling, right thumb joint changes could represent early arthropathy but no synovitis today. Will check ENA panel, serum complements, urine protein creatinine ratio, and inflammatory markers. If no specific positive changes would plan to follow up in case new symptoms develop but may not need additional treatment for current findings.   Urinary frequency - Plan: Protein / creatinine ratio, urine  Question interstitial cystitis  although hematuria suggests some other process. Checking for protein creatinine ratio, otherwise if not resolving probably needs renal imaging or cystoscopy.  Weight loss  Discussed her chronic GI symptoms very limited intake and borderline weight. If nothing specific found would recommend reevaluation by GI for this issue.  Orders: Orders Placed This Encounter  Procedures   Sedimentation rate   C-reactive protein   RNP Antibody   Anti-scleroderma antibody   Anti-Smith antibody   Sjogrens syndrome-A extractable nuclear antibody   Sjogrens syndrome-B extractable nuclear antibody   Anti-DNA antibody, double-stranded   C3 and C4   Protein / creatinine ratio, urine   Centromere Antibodies    No orders of the defined types were placed in this encounter.   Follow-Up Instructions: Return in about 4 weeks (around 05/05/2021) for New pt f/u positive ANA.   Fuller Plan, MD  Note - This record has been created using [employer].  Chart creation errors have been sought, but may not always  have been located. Such creation errors do not reflect on  the standard of medical care.

## 2021-04-10 LAB — PROTEIN / CREATININE RATIO, URINE
Creatinine, Urine: 192 mg/dL (ref 20–275)
Protein/Creat Ratio: 78 mg/g creat (ref 21–161)
Protein/Creatinine Ratio: 0.078 mg/mg creat (ref 0.021–0.161)
Total Protein, Urine: 15 mg/dL (ref 5–24)

## 2021-04-10 LAB — C3 AND C4
C3 Complement: 119 mg/dL (ref 83–193)
C4 Complement: 21 mg/dL (ref 15–57)

## 2021-04-10 LAB — SEDIMENTATION RATE: Sed Rate: 6 mm/h (ref 0–20)

## 2021-04-10 LAB — C-REACTIVE PROTEIN: CRP: 1.6 mg/L (ref <8.0)

## 2021-04-10 LAB — CENTROMERE ANTIBODIES: Centromere Ab Screen: <1 AI

## 2021-04-10 LAB — ANTI-SCLERODERMA ANTIBODY: Scleroderma (Scl-70) (ENA) Antibody, IgG: <1 AI

## 2021-04-10 LAB — SJOGRENS SYNDROME-B EXTRACTABLE NUCLEAR ANTIBODY: SSB (La) (ENA) Antibody, IgG: <1 AI

## 2021-04-10 LAB — ANTI-DNA ANTIBODY, DOUBLE-STRANDED: ds DNA Ab: <1 IU/mL

## 2021-04-10 LAB — ANTI-SMITH ANTIBODY: ENA SM Ab Ser-aCnc: <1 AI

## 2021-04-10 LAB — SJOGRENS SYNDROME-A EXTRACTABLE NUCLEAR ANTIBODY: SSA (Ro) (ENA) Antibody, IgG: <1 AI

## 2021-04-10 LAB — RNP ANTIBODY: Ribonucleic Protein(ENA) Antibody, IgG: <1 AI

## 2021-04-12 NOTE — Progress Notes (Signed)
Lab tests are all negative for evidence of inflammation or for specific disease markers such as lupus or other connective tissue diseases. We can take another look as planned next month, and discuss whether any treatments are needed at this time. Also to discuss about seeing other specialist for issues such as gastroenterology.

## 2021-05-04 ENCOUNTER — Ambulatory Visit: Payer: BC Managed Care – PPO | Admitting: Internal Medicine

## 2021-06-01 ENCOUNTER — Encounter (HOSPITAL_BASED_OUTPATIENT_CLINIC_OR_DEPARTMENT_OTHER): Payer: Self-pay | Admitting: Emergency Medicine

## 2021-06-01 ENCOUNTER — Other Ambulatory Visit: Payer: Self-pay

## 2021-06-01 ENCOUNTER — Encounter (HOSPITAL_COMMUNITY): Payer: Self-pay | Admitting: Emergency Medicine

## 2021-06-01 ENCOUNTER — Ambulatory Visit (HOSPITAL_COMMUNITY)
Admission: EM | Admit: 2021-06-01 | Discharge: 2021-06-01 | Disposition: A | Discharge status: Home / Self Care | Payer: BC Managed Care – PPO | Attending: Emergency Medicine | Admitting: Emergency Medicine

## 2021-06-01 ENCOUNTER — Emergency Department (HOSPITAL_BASED_OUTPATIENT_CLINIC_OR_DEPARTMENT_OTHER)
Admission: EM | Admit: 2021-06-01 | Discharge: 2021-06-01 | Disposition: A | Discharge status: Home / Self Care | Payer: BC Managed Care – PPO | Attending: Emergency Medicine | Admitting: Emergency Medicine

## 2021-06-01 DIAGNOSIS — J45909 Unspecified asthma, uncomplicated: Secondary | ICD-10-CM | POA: Diagnosis not present

## 2021-06-01 DIAGNOSIS — N390 Urinary tract infection, site not specified: Secondary | ICD-10-CM

## 2021-06-01 DIAGNOSIS — R319 Hematuria, unspecified: Secondary | ICD-10-CM | POA: Diagnosis present

## 2021-06-01 DIAGNOSIS — N12 Tubulo-interstitial nephritis, not specified as acute or chronic: Secondary | ICD-10-CM | POA: Diagnosis not present

## 2021-06-01 DIAGNOSIS — M545 Low back pain, unspecified: Secondary | ICD-10-CM

## 2021-06-01 LAB — POCT URINALYSIS DIPSTICK, ED / UC
Bilirubin Urine: NEGATIVE
Glucose, UA: NEGATIVE mg/dL
Ketones, ur: NEGATIVE mg/dL
Nitrite: POSITIVE — AB
Protein, ur: 30 mg/dL — AB
Specific Gravity, Urine: 1.025 (ref 1.005–1.030)
Urobilinogen, UA: 0.2 mg/dL (ref 0.0–1.0)
pH: 5.5 (ref 5.0–8.0)

## 2021-06-01 LAB — POC URINE PREG, ED: Preg Test, Ur: NEGATIVE

## 2021-06-01 MED ORDER — CEPHALEXIN 500 MG PO CAPS: 500.0000 mg | ORAL_CAPSULE | Freq: Four times a day (QID) | ORAL | 0 refills | Status: DC

## 2021-06-01 MED ORDER — CEPHALEXIN 250 MG PO CAPS
500.0000 mg | ORAL_CAPSULE | Freq: Once | ORAL | Status: AC
  Administered 2021-06-01: 500 mg via ORAL
  Filled 2021-06-01: qty 2

## 2021-06-01 NOTE — Discharge Instructions (Addendum)
If you develop worsening, continued, or recurrent abdominal pain, uncontrolled vomiting, fever, chest or back pain, or any other new/concerning symptoms then return to the ER for evaluation.  

## 2021-06-01 NOTE — ED Provider Notes (Signed)
[facility] EMERGENCY DEPT Provider Note   CSN: [id number] Arrival date & time: 06/01/21  1303     History Chief Complaint  Patient presents with   Urinary Tract Infection    Sandra Vance is a 20 y.o. female.  HPI 20 year old female presents with dysuria.  She has been having urinary symptoms for a couple days.  Started off with hematuria 2 days ago.  Yesterday developed some bilateral back pain and dysuria.  Still has the hematuria.  Is having increased urinary frequency with only small amount of urine coming out.  No fevers but is having a little nausea.  Went to urgent care and they told her to come here to rule out infected kidney stone.  Patient has been having some constant back pain that comes in waves at times. Pain is about a 7.  Past Medical History:  Diagnosis Date   Asthma     Patient Active Problem List   Diagnosis Date Noted   Positive ANA (antinuclear antibody) 04/07/2021   Arthralgia 04/07/2021   Weight loss 04/07/2021   Lymphadenopathy of head and neck 04/07/2021   Rash 04/07/2021   Urinary frequency 04/07/2021    History reviewed. No pertinent surgical history.   OB History   No obstetric history on file.     Family History  Problem Relation Age of Onset   Lupus Mother    Asthma Mother    Healthy Father    Autoimmune disease Sister    Asthma Sister    ADD / ADHD Sister    ADD / ADHD Sister    Asthma Sister    Asthma Brother    Lupus Paternal Aunt    Fibromyalgia Paternal Aunt    Sickle cell anemia Maternal Grandmother    Diabetes Maternal Grandfather     Social History   Tobacco Use   Smoking status: Never   Smokeless tobacco: Never  Vaping Use   Vaping Use: Former  Substance Use Topics   Alcohol use: Yes   Drug use: No    Home Medications Prior to Admission medications   Medication Sig Start Date End Date Taking? Authorizing Provider  cephALEXin (KEFLEX) 500 MG capsule Take 1 capsule (500 mg total) by mouth 4  (four) times daily for 10 days. 06/01/21 06/11/21 Yes Pricilla Loveless, MD  albuterol (PROVENTIL HFA;VENTOLIN HFA) 108 (90 BASE) MCG/ACT inhaler Inhale into the lungs every 6 (six) hours as needed for wheezing or shortness of breath.    [provider]  VIENVA 0.1-20 MG-MCG tablet Take 1 tablet by mouth daily. 02/06/21   [provider]    Allergies    Patient has no known allergies.  Review of Systems   Review of Systems  Constitutional:  Negative for fever.  Gastrointestinal:  Positive for abdominal pain and nausea. Negative for vomiting.  Genitourinary:  Positive for dysuria, frequency and hematuria.  Musculoskeletal:  Positive for back pain.  All other systems reviewed and are negative.  Physical Exam Updated Vital Signs BP 113/68 (BP Location: Left Arm)   Pulse 100   Temp 97.8 F (36.6 C) (Oral)   Resp 16   Ht 4\' 11"  (1.499 m)   Wt 43.1 kg   LMP 05/09/2021   SpO2 100%   BMI 19.19 kg/m   Physical Exam Vitals and nursing note reviewed.  Constitutional:      General: She is not in acute distress.    Appearance: She is well-developed. She is not ill-appearing or diaphoretic.  HENT:     Head: Normocephalic and atraumatic.     Right Ear: External ear normal.     Left Ear: External ear normal.     Nose: Nose normal.  Eyes:     General:        Right eye: No discharge.        Left eye: No discharge.  Cardiovascular:     Rate and Rhythm: Normal rate and regular rhythm.     Heart sounds: Normal heart sounds.  Pulmonary:     Effort: Pulmonary effort is normal.     Breath sounds: Normal breath sounds.  Abdominal:     Palpations: Abdomen is soft.     Tenderness: There is abdominal tenderness (mild) in the right lower quadrant and left lower quadrant. There is right CVA tenderness and left CVA tenderness.     Comments: Mild bilateral CVA tenderness  Skin:    General: Skin is warm and dry.  Neurological:     Mental Status: She is alert.  Psychiatric:         Mood and Affect: Mood is not anxious.    ED Results / Procedures / Treatments   Labs (all labs ordered are listed, but only abnormal results are displayed) Labs Reviewed  URINE CULTURE    EKG None  Radiology No results found.  Procedures Procedures   Medications Ordered in ED Medications  cephALEXin (KEFLEX) capsule 500 mg (has no administration in time range)    ED Course  I have reviewed the triage vital signs and the nursing notes.  Pertinent labs & imaging results that were available during my care of the patient were reviewed by me and considered in my medical decision making (see chart for details).    MDM Rules/Calculators/A&P                           Patient is quite well-appearing and resting comfortably.  She was sent from urgent care under the possibility of kidney stones that could be infected.  However her presentation is much more consistent with just pyelonephritis.  She has no severe pain on exam here or unilateral symptoms to suggest that she has an infected stone.  No fevers or vomiting.  My suspicion is very low that she has bilateral obstructing kidney stones causing bilateral flank pain with UTI.  More likely she has some mild hematuria from the urine infection which has now caused a mild pyelo.  I discussed options which would include a CT but after shared decision making we decided to hold off and treat with antibiotics and she should return if symptoms worsen Final Clinical Impression(s) / ED Diagnoses Final diagnoses:  Pyelonephritis    Rx / DC Orders ED Discharge Orders          Ordered    cephALEXin (KEFLEX) 500 MG capsule  4 times daily        06/01/21 1457             Pricilla Loveless, MD 06/01/21 1518

## 2021-06-01 NOTE — Discharge Instructions (Addendum)
Go to [facility] or [facility] emergency department.  Let them know if your symptoms return.  I am concerned that you could have infected or obstructing nephrolithiasis

## 2021-06-01 NOTE — ED Triage Notes (Signed)
Patient has complaints of back pain, lower back.  Patient has noticed blood in urine.  Onset Tuesday 05/30/2021.  Pain with urination, crampy, sharp pain.

## 2021-06-01 NOTE — ED Triage Notes (Addendum)
Pt arrives from UC with dx of UTI. Pt reports hematuria, lower back pain, and suprapubic pain since Tuesday 8/9. Pt sent here for possible obstructing nephrolithiasis.

## 2021-06-01 NOTE — ED Provider Notes (Signed)
HPI  SUBJECTIVE:  Sandra Vance is a 20 y.o. female who presents with 2 days of bilateral, intermittent wavelike back pain that lasts up to 30 minutes.  It radiates down her flank and into her suprapubic region with urination.  She reports hematuria as "every time" starting 2 days ago.  She reports low, crampy, midline abdominal pain only with urination.  It resolves active.  She reports nausea, no vomiting, fevers, body aches, malaise.  No vaginal complaints.  She is in a long-term monogamous relationship with a female, who is asymptomatic.  STDs are not a concern today. She has had microscopic hematuria for the past few months.  She has a provisional diagnosis of nephrolithiasis, but has never had a confirmatory CT scan.  No history of UTI, pyelonephritis, STD, BV, yeast.  No antibiotics in the past month.  No antipyretic in past 6 hours.  She tried heating pad with improvement in her symptoms.  Symptoms are worse with urination.  LMP: 7/19.  Denies possibility of being pregnant.  PMD: [name], MD    Past Medical History:  Diagnosis Date   Asthma     History reviewed. No pertinent surgical history.  Family History  Problem Relation Age of Onset   Lupus Mother    Asthma Mother    Healthy Father    Autoimmune disease Sister    Asthma Sister    ADD / ADHD Sister    ADD / ADHD Sister    Asthma Sister    Asthma Brother    Lupus Paternal Aunt    Fibromyalgia Paternal Aunt    Sickle cell anemia Maternal Grandmother    Diabetes Maternal Grandfather     Social History   Tobacco Use   Smoking status: Never   Smokeless tobacco: Never  Vaping Use   Vaping Use: Former  Substance Use Topics   Alcohol use: Yes   Drug use: No    No current facility-administered medications for this encounter.  Current Outpatient Medications:    albuterol (PROVENTIL HFA;VENTOLIN HFA) 108 (90 BASE) MCG/ACT inhaler, Inhale into the lungs every 6 (six) hours as needed for wheezing or shortness of  breath., Disp: , Rfl:    VIENVA 0.1-20 MG-MCG tablet, Take 1 tablet by mouth daily., Disp: , Rfl:   No Known Allergies   ROS  As noted in HPI.   Physical Exam  BP (!) 112/97 (BP Location: Right Arm)   Pulse 90   Temp 98.2 F (36.8 C) (Oral)   Resp 16   LMP 05/09/2021   SpO2 99%   Constitutional: Well developed, well nourished, no acute distress Eyes:  EOMI, conjunctiva normal bilaterally HENT: Normocephalic, atraumatic,mucus membranes moist Respiratory: Normal inspiratory effort Cardiovascular: Normal rate GI: nondistended.  Soft.  Positive suprapubic, bilateral flank tenderness. Back: No CVAT skin: No rash, skin intact Musculoskeletal: no deformities Neurologic: Alert & oriented x 3, no focal neuro deficits Psychiatric: Speech and behavior appropriate   ED Course   Medications - No data to display  No orders of the defined types were placed in this encounter.   Results for orders placed or performed during the hospital encounter of 06/01/21 (from the past 24 hour(s))  POC Urinalysis dipstick     Status: Abnormal   Collection Time: 06/01/21 10:17 AM  Result Value Ref Range   Glucose, UA NEGATIVE NEGATIVE mg/dL   Bilirubin Urine NEGATIVE NEGATIVE   Ketones, ur NEGATIVE NEGATIVE mg/dL   Specific Gravity, Urine 1.025 1.005 - 1.030  Hgb urine dipstick LARGE (A) NEGATIVE   pH 5.5 5.0 - 8.0   Protein, ur 30 (A) NEGATIVE mg/dL   Urobilinogen, UA 0.2 0.0 - 1.0 mg/dL   Nitrite POSITIVE (A) NEGATIVE   Leukocytes,Ua SMALL (A) NEGATIVE  POC urine pregnancy     Status: None   Collection Time: 06/01/21 10:20 AM  Result Value Ref Range   Preg Test, Ur NEGATIVE NEGATIVE   No results found.  ED Clinical Impression  1. Urinary tract infection with hematuria, site unspecified   2. Acute bilateral low back pain without sciatica      ED Assessment/Plan  UA: Positive nitrites, esterase, large hematuria, some proteinuria.  Urine pregnancy negative  Patient appears  nontoxic, her vitals are normal.  However she has bilateral CVA tenderness.  I am concerned for nephrolithiasis, particularly infected nephrolithiasis.  Could be early pyelonephritis however, she has no systemic symptoms at this time.  Discussed with patient that we could try getting a KUB here, however, it is not entirely diagnostic for the presence or absence of nephrolithiasis.  She has never had a CT scan evaluating for nephrolithiasis.  She has opted to go to the emergency department for CT of the abdomen and further treatment.  She will go to [facility] or [facility].  She is stable to go by private vehicle.  Declined pain or nausea medication at this time  Discussed medical decision making, rationale for transfer to the emergency department with patient.  She agrees with plan.  No orders of the defined types were placed in this encounter.     *This clinic note was created using Dragon dictation software. Therefore, there may be occasional mistakes despite careful proofreading.  ?    Domenick Gong, MD 06/01/21 1055

## 2021-06-04 LAB — URINE CULTURE: Culture: >=100000 — AB

## 2021-06-05 ENCOUNTER — Telehealth: Payer: Self-pay

## 2021-06-05 NOTE — Telephone Encounter (Signed)
Post ED Visit - Positive Culture Follow-up  Culture report reviewed by antimicrobial stewardship pharmacist: [facility] [x]  , Pharm.D. []  Audie Clear, Pharm.D., BCPS AQ-ID []  , Pharm.D., BCPS []  Celedonio Miyamoto, Pharm.D., BCPS []  [place], Garvin Fila.D., BCPS, AAHIVP []  , Pharm.D., BCPS, AAHIVP []  Georgina Pillion, PharmD, BCPS []  , PharmD, BCPS []  [place], PharmD, BCPS []  [place], PharmD []  , PharmD, BCPS []  Estella Husk, PharmD  Pharmacy Team []  Lysle Pearl, PharmD []  , PharmD []  Phillips Climes, PharmD []  , Rph []  Agapito Games) , PharmD []  Verlan Friends, PharmD []  , PharmD []  Mervyn Gay, PharmD []  , PharmD []  Vinnie Level, PharmD []  [facility], PharmD []  , PharmD []  Len Childs, PharmD   Positive Urine culture Treated with Cephalexin, organism sensitive to the same and no further patient follow-up is required at this time.  06/05/2021, 11:35 AM

## 2021-06-07 ENCOUNTER — Other Ambulatory Visit: Payer: Self-pay

## 2021-06-07 ENCOUNTER — Inpatient Hospital Stay (HOSPITAL_COMMUNITY)
Admission: EM | Admit: 2021-06-07 | Discharge: 2021-06-11 | DRG: 690 | Disposition: A | Discharge status: Home / Self Care | Payer: BC Managed Care – PPO | Attending: Internal Medicine | Admitting: Internal Medicine

## 2021-06-07 ENCOUNTER — Encounter (HOSPITAL_COMMUNITY): Payer: Self-pay | Admitting: Emergency Medicine

## 2021-06-07 DIAGNOSIS — Z28310 Unvaccinated for covid-19: Secondary | ICD-10-CM

## 2021-06-07 DIAGNOSIS — R000 Tachycardia, unspecified: Secondary | ICD-10-CM | POA: Diagnosis present

## 2021-06-07 DIAGNOSIS — Z832 Family history of diseases of the blood and blood-forming organs and certain disorders involving the immune mechanism: Secondary | ICD-10-CM

## 2021-06-07 DIAGNOSIS — Z79899 Other long term (current) drug therapy: Secondary | ICD-10-CM

## 2021-06-07 DIAGNOSIS — Z818 Family history of other mental and behavioral disorders: Secondary | ICD-10-CM

## 2021-06-07 DIAGNOSIS — R109 Unspecified abdominal pain: Secondary | ICD-10-CM

## 2021-06-07 DIAGNOSIS — Z20822 Contact with and (suspected) exposure to covid-19: Secondary | ICD-10-CM | POA: Diagnosis present

## 2021-06-07 DIAGNOSIS — R6510 Systemic inflammatory response syndrome (SIRS) of non-infectious origin without acute organ dysfunction: Secondary | ICD-10-CM | POA: Diagnosis present

## 2021-06-07 DIAGNOSIS — Z825 Family history of asthma and other chronic lower respiratory diseases: Secondary | ICD-10-CM

## 2021-06-07 DIAGNOSIS — N12 Tubulo-interstitial nephritis, not specified as acute or chronic: Secondary | ICD-10-CM | POA: Diagnosis not present

## 2021-06-07 DIAGNOSIS — N10 Acute tubulo-interstitial nephritis: Secondary | ICD-10-CM | POA: Diagnosis present

## 2021-06-07 DIAGNOSIS — J4520 Mild intermittent asthma, uncomplicated: Secondary | ICD-10-CM | POA: Diagnosis present

## 2021-06-07 DIAGNOSIS — Z833 Family history of diabetes mellitus: Secondary | ICD-10-CM

## 2021-06-07 DIAGNOSIS — B9620 Unspecified Escherichia coli [E. coli] as the cause of diseases classified elsewhere: Secondary | ICD-10-CM | POA: Diagnosis present

## 2021-06-07 DIAGNOSIS — E860 Dehydration: Secondary | ICD-10-CM | POA: Diagnosis present

## 2021-06-07 LAB — CBC WITH DIFFERENTIAL/PLATELET
Abs Immature Granulocytes: 0.02 10*3/uL (ref 0.00–0.07)
Basophils Absolute: 0 10*3/uL (ref 0.0–0.1)
Basophils Relative: 0 %
Eosinophils Absolute: 0 10*3/uL (ref 0.0–0.5)
Eosinophils Relative: 0 %
HCT: 35.6 % — ABNORMAL LOW (ref 36.0–46.0)
Hemoglobin: 12.4 g/dL (ref 12.0–15.0)
Immature Granulocytes: 0 %
Lymphocytes Relative: 11 %
Lymphs Abs: 0.9 10*3/uL (ref 0.7–4.0)
MCH: 28 pg (ref 26.0–34.0)
MCHC: 34.8 g/dL (ref 30.0–36.0)
MCV: 80.4 fL (ref 80.0–100.0)
Monocytes Absolute: 0.5 10*3/uL (ref 0.1–1.0)
Monocytes Relative: 6 %
Neutro Abs: 6.9 10*3/uL (ref 1.7–7.7)
Neutrophils Relative %: 83 %
Platelets: 265 10*3/uL (ref 150–400)
RBC: 4.43 MIL/uL (ref 3.87–5.11)
RDW: 13.4 % (ref 11.5–15.5)
WBC: 8.4 10*3/uL (ref 4.0–10.5)
nRBC: 0 % (ref 0.0–0.2)

## 2021-06-07 LAB — URINALYSIS, ROUTINE W REFLEX MICROSCOPIC
Bilirubin Urine: NEGATIVE
Glucose, UA: NEGATIVE mg/dL
Ketones, ur: NEGATIVE mg/dL
Nitrite: POSITIVE — AB
Protein, ur: 30 mg/dL — AB
Specific Gravity, Urine: 1.011 (ref 1.005–1.030)
WBC, UA: >50 WBC/hpf — ABNORMAL HIGH (ref 0–5)
pH: 5 (ref 5.0–8.0)

## 2021-06-07 LAB — COMPREHENSIVE METABOLIC PANEL
ALT: 11 U/L (ref 0–44)
AST: 19 U/L (ref 15–41)
Albumin: 3.7 g/dL (ref 3.5–5.0)
Alkaline Phosphatase: 46 U/L (ref 38–126)
Anion gap: 11 (ref 5–15)
BUN: 9 mg/dL (ref 6–20)
CO2: 20 mmol/L — ABNORMAL LOW (ref 22–32)
Calcium: 9 mg/dL (ref 8.9–10.3)
Chloride: 103 mmol/L (ref 98–111)
Creatinine, Ser: 0.99 mg/dL (ref 0.44–1.00)
GFR, Estimated: >60 mL/min (ref >60)
Glucose, Bld: 88 mg/dL (ref 70–99)
Potassium: 3.8 mmol/L (ref 3.5–5.1)
Sodium: 134 mmol/L — ABNORMAL LOW (ref 135–145)
Total Bilirubin: 0.9 mg/dL (ref 0.3–1.2)
Total Protein: 7.3 g/dL (ref 6.5–8.1)

## 2021-06-07 LAB — I-STAT BETA HCG BLOOD, ED (MC, WL, AP ONLY): I-stat hCG, quantitative: <5 m[IU]/mL (ref <5)

## 2021-06-07 NOTE — ED Provider Notes (Signed)
Emergency Medicine Provider Triage Evaluation Note  Sandra Vance , a 20 y.o. female  was evaluated in triage.  Pt complains of right-sided flank pain and dysuria, chills and fever.  Treated for pyelonephritis with antibiotics but she feels like they are not helping.  Never had a kidney infection in the past.  Review of Systems  Positive: Flank pain, dysuria, fever, diarrhea Negative: vomiting  Physical Exam  LMP 05/09/2021  Gen:   Awake, no distress   Resp:  Normal effort  MSK:   Moves extremities without difficulty  Other:  R flank pain  Medical Decision Making  Medically screening exam initiated at 7:46 PM.  Appropriate orders placed.  Sandra Vance was informed that the remainder of the evaluation will be completed by another provider, this initial triage assessment does not replace that evaluation, and the importance of remaining in the ED until their evaluation is complete.  Urine culture from last week was pansensitive repeat work you ordered   Dietrich Pates, PA-C 06/07/21 1947    Mancel Bale, MD 06/07/21 2321

## 2021-06-07 NOTE — ED Triage Notes (Signed)
Pt reports continued right flank pain and painful urination.  Has been treated for UTI since last Thursday w/ antibiotics w/ no relief.  Reports chills and fever now.

## 2021-06-08 ENCOUNTER — Emergency Department (HOSPITAL_COMMUNITY): Payer: BC Managed Care – PPO

## 2021-06-08 ENCOUNTER — Encounter (HOSPITAL_COMMUNITY): Payer: Self-pay | Admitting: Internal Medicine

## 2021-06-08 DIAGNOSIS — Z832 Family history of diseases of the blood and blood-forming organs and certain disorders involving the immune mechanism: Secondary | ICD-10-CM | POA: Diagnosis not present

## 2021-06-08 DIAGNOSIS — N10 Acute tubulo-interstitial nephritis: Secondary | ICD-10-CM | POA: Diagnosis present

## 2021-06-08 DIAGNOSIS — J4520 Mild intermittent asthma, uncomplicated: Secondary | ICD-10-CM

## 2021-06-08 DIAGNOSIS — E860 Dehydration: Secondary | ICD-10-CM | POA: Diagnosis present

## 2021-06-08 DIAGNOSIS — Z79899 Other long term (current) drug therapy: Secondary | ICD-10-CM | POA: Diagnosis not present

## 2021-06-08 DIAGNOSIS — B9620 Unspecified Escherichia coli [E. coli] as the cause of diseases classified elsewhere: Secondary | ICD-10-CM | POA: Diagnosis present

## 2021-06-08 DIAGNOSIS — N12 Tubulo-interstitial nephritis, not specified as acute or chronic: Secondary | ICD-10-CM | POA: Diagnosis present

## 2021-06-08 DIAGNOSIS — Z28310 Unvaccinated for covid-19: Secondary | ICD-10-CM | POA: Diagnosis not present

## 2021-06-08 DIAGNOSIS — R000 Tachycardia, unspecified: Secondary | ICD-10-CM | POA: Diagnosis present

## 2021-06-08 DIAGNOSIS — Z20822 Contact with and (suspected) exposure to covid-19: Secondary | ICD-10-CM | POA: Diagnosis present

## 2021-06-08 DIAGNOSIS — Z833 Family history of diabetes mellitus: Secondary | ICD-10-CM | POA: Diagnosis not present

## 2021-06-08 DIAGNOSIS — R6510 Systemic inflammatory response syndrome (SIRS) of non-infectious origin without acute organ dysfunction: Secondary | ICD-10-CM | POA: Diagnosis present

## 2021-06-08 DIAGNOSIS — Z825 Family history of asthma and other chronic lower respiratory diseases: Secondary | ICD-10-CM | POA: Diagnosis not present

## 2021-06-08 DIAGNOSIS — Z818 Family history of other mental and behavioral disorders: Secondary | ICD-10-CM | POA: Diagnosis not present

## 2021-06-08 LAB — LACTIC ACID, PLASMA: Lactic Acid, Venous: 1.4 mmol/L (ref 0.5–1.9)

## 2021-06-08 LAB — HIV ANTIBODY (ROUTINE TESTING W REFLEX): HIV Screen 4th Generation wRfx: NONREACTIVE

## 2021-06-08 MED ORDER — ENOXAPARIN SODIUM 40 MG/0.4ML IJ SOSY
40.0000 mg | PREFILLED_SYRINGE | INTRAMUSCULAR | Status: DC
  Administered 2021-06-08: 40 mg via SUBCUTANEOUS
  Filled 2021-06-08: qty 0.4

## 2021-06-08 MED ORDER — SODIUM CHLORIDE 0.9 % IV SOLN: 1.0000 g | INTRAVENOUS | Status: DC

## 2021-06-08 MED ORDER — BISACODYL 5 MG PO TBEC: 5.0000 mg | DELAYED_RELEASE_TABLET | Freq: Every day | ORAL | Status: DC | PRN

## 2021-06-08 MED ORDER — ACETAMINOPHEN 325 MG PO TABS: 650.0000 mg | ORAL_TABLET | Freq: Once | ORAL | Status: DC

## 2021-06-08 MED ORDER — ACETAMINOPHEN 500 MG PO TABS
1000.0000 mg | ORAL_TABLET | Freq: Four times a day (QID) | ORAL | Status: DC | PRN
  Administered 2021-06-08 (×2): 1000 mg via ORAL
  Filled 2021-06-08 (×3): qty 2

## 2021-06-08 MED ORDER — SODIUM CHLORIDE 0.9 % IV SOLN
1.0000 g | Freq: Once | INTRAVENOUS | Status: AC
  Administered 2021-06-08: 1 g via INTRAVENOUS
  Filled 2021-06-08: qty 10

## 2021-06-08 MED ORDER — MORPHINE SULFATE (PF) 2 MG/ML IV SOLN
2.0000 mg | INTRAVENOUS | Status: DC | PRN
Start: 2021-06-08 — End: 2021-06-11

## 2021-06-08 MED ORDER — HYDROCODONE-ACETAMINOPHEN 5-325 MG PO TABS: 1.0000 | ORAL_TABLET | ORAL | Status: DC | PRN

## 2021-06-08 MED ORDER — LACTATED RINGERS IV SOLN: INTRAVENOUS | Status: DC

## 2021-06-08 MED ORDER — POLYETHYLENE GLYCOL 3350 17 G PO PACK: 17.0000 g | PACK | Freq: Every day | ORAL | Status: DC | PRN

## 2021-06-08 MED ORDER — IOHEXOL 350 MG/ML SOLN
55.0000 mL | Freq: Once | INTRAVENOUS | Status: AC | PRN
  Administered 2021-06-08: 55 mL via INTRAVENOUS

## 2021-06-08 MED ORDER — SODIUM CHLORIDE 0.9% FLUSH
3.0000 mL | Freq: Two times a day (BID) | INTRAVENOUS | Status: DC
  Administered 2021-06-08 – 2021-06-10 (×4): 3 mL via INTRAVENOUS

## 2021-06-08 MED ORDER — ALBUTEROL SULFATE (2.5 MG/3ML) 0.083% IN NEBU: 3.0000 mL | INHALATION_SOLUTION | Freq: Four times a day (QID) | RESPIRATORY_TRACT | Status: DC | PRN

## 2021-06-08 MED ORDER — HYDRALAZINE HCL 20 MG/ML IJ SOLN: 5.0000 mg | INTRAMUSCULAR | Status: DC | PRN

## 2021-06-08 MED ORDER — LACTATED RINGERS IV BOLUS
1000.0000 mL | Freq: Once | INTRAVENOUS | Status: AC
  Administered 2021-06-08: 1000 mL via INTRAVENOUS

## 2021-06-08 MED ORDER — ONDANSETRON HCL 4 MG PO TABS: 4.0000 mg | ORAL_TABLET | Freq: Four times a day (QID) | ORAL | Status: DC | PRN

## 2021-06-08 MED ORDER — DOCUSATE SODIUM 100 MG PO CAPS
100.0000 mg | ORAL_CAPSULE | Freq: Two times a day (BID) | ORAL | Status: DC
  Administered 2021-06-08 – 2021-06-10 (×3): 100 mg via ORAL
  Filled 2021-06-08 (×6): qty 1

## 2021-06-08 MED ORDER — ACETAMINOPHEN 500 MG PO TABS
1000.0000 mg | ORAL_TABLET | Freq: Once | ORAL | Status: AC
  Administered 2021-06-08: 1000 mg via ORAL
  Filled 2021-06-08: qty 2

## 2021-06-08 MED ORDER — LEVONORGESTREL-ETHINYL ESTRAD 0.1-20 MG-MCG PO TABS
1.0000 | ORAL_TABLET | Freq: Every day | ORAL | Status: DC
  Administered 2021-06-10: 1 via ORAL

## 2021-06-08 MED ORDER — ONDANSETRON HCL 4 MG/2ML IJ SOLN: 4.0000 mg | Freq: Four times a day (QID) | INTRAMUSCULAR | Status: DC | PRN

## 2021-06-08 MED ORDER — IBUPROFEN 400 MG PO TABS
400.0000 mg | ORAL_TABLET | Freq: Four times a day (QID) | ORAL | Status: DC | PRN
  Administered 2021-06-08: 400 mg via ORAL
  Filled 2021-06-08: qty 1

## 2021-06-08 NOTE — ED Notes (Signed)
Provider aware of new vitals, ordered EKG and Tylenol

## 2021-06-08 NOTE — ED Notes (Signed)
Report called to 79M nurse Shelva Majestic

## 2021-06-08 NOTE — H&P (Signed)
History and Physical    Sandra Vance OVF:[id number] DOB: 06-03-2001 DOA: 06/07/2021  PCP: [name], MD Consultants:  Dimple Casey - rheumatology Patient coming from:  Home - lives with mother, siblings; NOK: Mother, [phone]  Chief Complaint: Flank pain , dysuria  HPI: Sandra Vance is a 20 y.o. female with medical history significant of asthma presenting with persistent R flank pain and dysuria despite recent UTI treatment.   Last Tuesday, symptoms started with hematuria.  She noticed dysuria and went to UC on 8/11, diagnosed with UTI.  She was sent to the ER to r/o stones.  She was started on Keflex and took them routinely but had n/v/d and was not noticing improvement in symptoms so stopped taking it yesterday.  +fever to 104 yesterday.  R flank pain.  2 female partners in the last year, has been with current boyfriend since February; he is not having symptoms.   ED Course: Pyelo with symptoms for 9 days.  E coli, pansensitive on 8/11 and treated with Keflex.  CT with pyelo.  Given Rocephin, IVF, Tylenol.  Review of Systems: As per HPI; otherwise review of systems reviewed and negative.   Ambulatory Status:  Ambulates without assistance  COVID Vaccine Status:  None  Past Medical History:  Diagnosis Date   Asthma     History reviewed. No pertinent surgical history.  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  Tobacco Use   Smoking status: Never   Smokeless tobacco: Never  Vaping Use   Vaping Use: Former  Substance and Sexual Activity   Alcohol use: Yes    Comment: "not as much as I used to"   Drug use: No   Sexual activity: Yes    Birth control/protection: Pill  Other Topics Concern   Not on file  Social History Narrative   Not on file    Social Determinants of Health   Financial Resource Strain: Not on file  Food Insecurity: Not on file  Transportation Needs: Not on file  Physical Activity: Not on file  Stress: Not on file  Social Connections: Not on file  Intimate Partner Violence: Not on file    No Known Allergies  Family History  Problem Relation Age of Onset   Lupus Mother    Asthma Mother    Healthy Father    Autoimmune disease Sister    Asthma Sister    ADD / ADHD Sister    ADD / ADHD Sister    Asthma Sister    Asthma Brother    Lupus Paternal Aunt    Fibromyalgia Paternal Aunt    Sickle cell anemia Maternal Grandmother    Diabetes Maternal Grandfather     Prior to Admission medications   Medication Sig Start Date End Date Taking? Authorizing Provider  cephALEXin (KEFLEX) 500 MG capsule Take 1 capsule (500 mg total) by mouth 4 (four) times daily for 10 days. 06/01/21 06/11/21 Yes Pricilla Loveless, MD  ibuprofen (ADVIL) 200 MG tablet Take 400 mg by mouth every 6 (six) hours as needed for fever.   Yes [provider]  VIENVA 0.1-20 MG-MCG tablet Take 1 tablet by mouth at bedtime. 02/06/21  Yes [provider]  albuterol (PROVENTIL HFA;VENTOLIN HFA)  108 (90 BASE) MCG/ACT inhaler Inhale into the lungs every 6 (six) hours as needed for wheezing or shortness of breath.    [provider]    Physical Exam: Vitals:   06/08/21 1330 06/08/21 1345 06/08/21 1400 06/08/21 1415  BP: 102/67 108/68 118/65 112/73  Pulse: (!) 122 (!) 127 (!) 118 (!) 114  Resp: (!) 28 20 (!) 27 (!) 29  Temp:      TempSrc:      SpO2: 99% 99% 99% 99%  Weight:      Height:         General:  Appears calm and comfortable and is in NAD, mildly ill-appearing Eyes:  PERRL, EOMI, normal lids, iris ENT:  grossly normal hearing, lips & tongue, mildly dry mm; appropriate dentition Neck:  no LAD, masses or thyromegaly Cardiovascular:  RR with tachycardia to 140s, no m/r/g. No LE edema.  Respiratory:   CTA  bilaterally with no wheezes/rales/rhonchi.  Mildly increased respiratory effort. Abdomen:  soft, NT, ND, no suprapubic TTP Back:   normal alignment, + R CVAT Skin:  no rash or induration seen on limited exam Musculoskeletal:  grossly normal tone BUE/BLE, good ROM, no bony abnormality Lower extremity:  No LE edema.  Limited foot exam with no ulcerations.  2+ distal pulses. Psychiatric:  blunted mood and affect, speech fluent and appropriate, AOx3 Neurologic:  CN 2-12 grossly intact, moves all extremities in coordinated fashion    Radiological Exams on Admission: Independently reviewed - see discussion in A/P where applicable  DG Chest 2 View  Result Date: 06/08/2021 CLINICAL DATA:  Sepsis. EXAM: CHEST - 2 VIEW COMPARISON:  Chest x-ray dated May 23, 2016. FINDINGS: The heart size and mediastinal contours are within normal limits. Both lungs are clear. The visualized skeletal structures are unremarkable. IMPRESSION: No active cardiopulmonary disease. Electronically Signed   By: Obie Dredge M.D.   On: 06/08/2021 09:02   CT ABDOMEN PELVIS W CONTRAST  Result Date: 06/08/2021 CLINICAL DATA:  Pyelonephritis, concern for kidney stones and/or abscess. EXAM: CT ABDOMEN AND PELVIS WITH CONTRAST TECHNIQUE: Multidetector CT imaging of the abdomen and pelvis was performed using the standard protocol following bolus administration of intravenous contrast. CONTRAST:  9mL OMNIPAQUE IOHEXOL 350 MG/ML SOLN COMPARISON:  CT abdomen pelvis 02/25/2021 FINDINGS: Lower chest: No acute abnormality. Hepatobiliary: No focal liver abnormality is seen. No gallstones, gallbladder wall thickening, or biliary dilatation. Pancreas: Unremarkable. No pancreatic ductal dilatation or surrounding inflammatory changes. Spleen: Normal in size without focal abnormality. Adrenals/Urinary Tract: Bilateral multifocal wedge-shaped areas of hypoenhancement in the renal cortices (series 3, images 29-36). There is no perinephric fat  stranding or perinephric fluid collection. No renal stone visualized in either renal collecting system or urinary bladder. Urinary bladder is unremarkable. Adrenal glands are unremarkable. Stomach/Bowel: Stomach is within normal limits. No evidence of bowel wall thickening, distention, or inflammatory changes. The appendix is not directly visualized, however no inflammatory changes are noted in the right lower quadrant. Vascular/Lymphatic: No significant vascular findings are present. No enlarged abdominal or pelvic lymph nodes. Reproductive: Uterus and bilateral adnexa are unremarkable. Other: No abdominal wall hernia or abnormality. No abdominopelvic ascites. Musculoskeletal: No acute or significant osseous findings. IMPRESSION: Bilateral multifocal wedge-shaped areas of hypoenhancement in the renal cortices, consistent with bilateral multifocal pyelonephritis. No CT findings of renal or perinephric abscess. No renal stone in either renal collecting system or urinary bladder. Electronically Signed   By: Sherron Ales M.D.   On: 06/08/2021 09:27    EKG: Independently  reviewed.  Sinus tachycardia with rate 148; nonspecific ST changes that are likely rate-related   Labs on Admission: I have personally reviewed the available labs and imaging studies at the time of the admission.  Pertinent labs:   Unremarkable CMP Unremarkable CBC Lactate 1.4 HCG <5 UA: large Hgb, Large LE, +nitrites, 30 protein, few bacteria, >50 WBC Urine culture from today pending Urine culture from 8/11 with pansensitive E coli   Assessment/Plan Principal Problem:   Pyelonephritis Active Problems:   Asthma in adult, mild intermittent, uncomplicated   Pyelonephritis -Patient presenting without sepsis physiology (SIRS criteria without evidence of organ dysfunction) but with symptoms concerning for refractory UTI -She was cultured on 8/11 with +E coli, pansensitive and was treated appropriately with Keflex which she has been  taking x 4 days without improvement -Today's UA again appears to demonstrate infection; will repeat urine culture and obtain blood cultures -CT with B multifocal pyelonephritis (although only with R flank pain) -Will continue treatment with Rocephin (received first dose in the ER) -Will admit due to failure of outpatient treatment -Will add on STI testing - GC/Chl, RPR, and HIV -Continue Tylenol/Motrin prn fever -Tachycardia is likely due to fever and mild dehydration in the setting of infection; will monitor on telemetry and continue IVF  Asthma -Continue prn Albuterol     Note: This patient has been ordered for testing for the novel coronavirus COVID-19. The patient has NOT been vaccinated against COVID-19.   Level of care: Telemetry Medical DVT prophylaxis:  Lovenox  Code Status:  Full - confirmed with patient/family Family Communication: Mother was present for majority of evaluation. Disposition Plan:  The patient is from: home  Anticipated d/c is to: home without [facility] services   Anticipated d/c date will depend on clinical response to treatment, likely 2-3 days  Patient is currently: acutely ill Consults called: None  Admission status:  Admit - It is my clinical opinion that admission to INPATIENT is reasonable and necessary because of the expectation that this patient will require hospital care that crosses at least 2 midnights to treat this condition based on the medical complexity of the problems presented.  Given the aforementioned information, the predictability of an adverse outcome is felt to be significant.    Jonah Blue MD [facility]   How to contact the [facility] Attending or Consulting provider 7A - 7P or covering provider during after hours 7P -7A, for this patient?  Check the care team in [facility] and look for a) attending/consulting [facility] provider listed and b) the [facility] team listed Log into [email or web address] and use [place]'s universal password to access. If you do  not have the password, please contact the hospital operator. Locate the [facility] provider you are looking for under [facility] and page to a number that you can be directly reached. If you still have difficulty reaching the provider, please page the [facility] (Director on Call) for the Hospitalists listed on amion for assistance.   06/08/2021, 2:54 PM

## 2021-06-08 NOTE — ED Notes (Signed)
Pt transported to CT at this time.

## 2021-06-08 NOTE — ED Notes (Signed)
Tried to call report to floor

## 2021-06-08 NOTE — ED Provider Notes (Signed)
Patient seen and examined, agree with assessment and plan by Resident. Patient with pyelonephritis, recently on oral keflex with urine culture sensitive to same. Now with fever, tachycardia, flank pain. CT without stones confirms pyelo. Admit for IV Abx.   Pollyann Savoy, MD 06/08/21 [phone]

## 2021-06-08 NOTE — ED Notes (Signed)
MD Ophelia Charter made aware of vitals

## 2021-06-08 NOTE — ED Provider Notes (Signed)
[facility] EMERGENCY DEPARTMENT Provider Note   CSN: [id number] Arrival date & time: 06/07/21  1940     History Chief Complaint  Patient presents with   Flank Pain         Sandra Vance is a 20 y.o. female.   Flank Pain This is a new problem. The current episode started 2 days ago. Pertinent negatives include no chest pain, no abdominal pain, no headaches and no shortness of breath. The symptoms are aggravated by walking. Nothing relieves the symptoms. She has tried nothing for the symptoms.   Patient is a 20 year old female with a past medical history of asthma who is presenting for right flank pain.  Patient states that 9 days ago she noticed hematuria and dysuria.  7 days ago she was diagnosed with a UTI.  She was started on Keflex 500 mg 4 times daily.  She has been taking it daily. Over the last 2 days she has been experiencing right-sided flank pain, dysuria, chills and fever.  She also complains of decreased urinary output.  She has pain with palpation of her right flank. She denies any history of kidney stones.  She denies any hematuria at this time.  She denies a history of pyelonephritis.  Patient denies any chest pain, shortness of breath, nausea, vomiting, diarrhea, numbness or weakness.  Past Medical History:  Diagnosis Date   Asthma     Patient Active Problem List   Diagnosis Date Noted   Pyelonephritis 06/08/2021   Positive ANA (antinuclear antibody) 04/07/2021   Arthralgia 04/07/2021   Weight loss 04/07/2021   Lymphadenopathy of head and neck 04/07/2021   Rash 04/07/2021   Urinary frequency 04/07/2021    History reviewed. No pertinent surgical history.   OB History   No obstetric history on file.     Family History  Problem Relation Age of Onset   Lupus Mother    Asthma Mother    Healthy Father    Autoimmune disease Sister    Asthma Sister    ADD / ADHD Sister    ADD / ADHD Sister    Asthma Sister    Asthma Brother     Lupus Paternal Aunt    Fibromyalgia Paternal Aunt    Sickle cell anemia Maternal Grandmother    Diabetes Maternal Grandfather     Social History   Tobacco Use   Smoking status: Never   Smokeless tobacco: Never  Vaping Use   Vaping Use: Former  Substance Use Topics   Alcohol use: Yes   Drug use: No    Home Medications Prior to Admission medications   Medication Sig Start Date End Date Taking? Authorizing Provider  cephALEXin (KEFLEX) 500 MG capsule Take 1 capsule (500 mg total) by mouth 4 (four) times daily for 10 days. 06/01/21 06/11/21 Yes Pricilla Loveless, MD  ibuprofen (ADVIL) 200 MG tablet Take 400 mg by mouth every 6 (six) hours as needed for fever.   Yes [provider]  VIENVA 0.1-20 MG-MCG tablet Take 1 tablet by mouth at bedtime. 02/06/21  Yes [provider]  albuterol (PROVENTIL HFA;VENTOLIN HFA) 108 (90 BASE) MCG/ACT inhaler Inhale into the lungs every 6 (six) hours as needed for wheezing or shortness of breath.    [provider]    Allergies    Patient has no known allergies.  Review of Systems   Review of Systems  Constitutional:  Negative for chills, diaphoresis, fatigue and fever.  HENT:  Negative for congestion,  dental problem, ear discharge, ear pain, facial swelling, hearing loss, nosebleeds, postnasal drip, rhinorrhea, sinus pain, sneezing, sore throat and trouble swallowing.   Eyes:  Negative for pain and visual disturbance.  Respiratory:  Negative for cough, chest tightness, shortness of breath, wheezing and stridor.   Cardiovascular:  Negative for chest pain, palpitations and leg swelling.  Gastrointestinal:  Negative for abdominal distention, abdominal pain, blood in stool, constipation, diarrhea, nausea and vomiting.  Endocrine: Negative for polydipsia and polyuria.  Genitourinary:  Positive for difficulty urinating, dysuria, flank pain and frequency. Negative for hematuria, urgency, vaginal bleeding and vaginal discharge.   Musculoskeletal:  Negative for myalgias, neck pain and neck stiffness.  Skin:  Negative for rash and wound.  Allergic/Immunologic: Negative for environmental allergies and food allergies.  Neurological:  Negative for dizziness, seizures, syncope, facial asymmetry, speech difficulty, weakness, light-headedness, numbness and headaches.  Psychiatric/Behavioral:  Negative for agitation, behavioral problems and confusion.    Physical Exam Updated Vital Signs BP 102/68   Pulse (!) 115   Temp 99.8 F (37.7 C) (Oral)   Resp (!) 27   Ht 4\' 11"  (1.499 m)   Wt 43.5 kg   LMP 05/09/2021   SpO2 100%   BMI 19.39 kg/m   Physical Exam Vitals and nursing note reviewed.  Constitutional:      General: She is not in acute distress.    Appearance: Normal appearance. She is normal weight. She is not ill-appearing.  HENT:     Head: Normocephalic and atraumatic.     Right Ear: External ear normal.     Left Ear: External ear normal.     Nose: Nose normal. No congestion.     Mouth/Throat:     Mouth: Mucous membranes are moist.     Pharynx: Oropharynx is clear. No oropharyngeal exudate or posterior oropharyngeal erythema.  Eyes:     General: No visual field deficit.    Extraocular Movements: Extraocular movements intact.     Conjunctiva/sclera: Conjunctivae normal.     Pupils: Pupils are equal, round, and reactive to light.  Neck:     Vascular: No carotid bruit.  Cardiovascular:     Rate and Rhythm: Normal rate and regular rhythm.     Pulses: Normal pulses.     Heart sounds: Normal heart sounds. No murmur heard. Pulmonary:     Effort: Pulmonary effort is normal. No respiratory distress.     Breath sounds: Normal breath sounds. No stridor. No wheezing, rhonchi or rales.  Chest:     Chest wall: No tenderness.  Abdominal:     General: Bowel sounds are normal. There is no distension.     Palpations: Abdomen is soft.     Tenderness: There is no abdominal tenderness. There is right CVA  tenderness. There is no left CVA tenderness, guarding or rebound.  Musculoskeletal:        General: No swelling or tenderness. Normal range of motion.     Cervical back: Normal range of motion and neck supple. No rigidity, tenderness or bony tenderness.     Thoracic back: Normal. No tenderness or bony tenderness.     Lumbar back: Normal. No tenderness or bony tenderness.     Right lower leg: No edema.     Left lower leg: No edema.  Skin:    General: Skin is warm and dry.     Coloration: Skin is not jaundiced.  Neurological:     General: No focal deficit present.     Mental Status:  She is alert and oriented to person, place, and time. Mental status is at baseline.     Cranial Nerves: Cranial nerves are intact. No cranial nerve deficit, dysarthria or facial asymmetry.     Sensory: Sensation is intact. No sensory deficit.     Motor: Motor function is intact. No weakness.     Coordination: Coordination is intact. Finger-Nose-Finger Test normal.     Gait: Gait is intact. Gait normal.  Psychiatric:        Mood and Affect: Mood normal.        Behavior: Behavior normal.        Thought Content: Thought content normal.        Judgment: Judgment normal.    ED Results / Procedures / Treatments   Labs (all labs ordered are listed, but only abnormal results are displayed) Labs Reviewed  COMPREHENSIVE METABOLIC PANEL - Abnormal; Notable for the following components:      Result Value   Sodium 134 (*)    CO2 20 (*)    All other components within normal limits  CBC WITH DIFFERENTIAL/PLATELET - Abnormal; Notable for the following components:   HCT 35.6 (*)    All other components within normal limits  URINALYSIS, ROUTINE W REFLEX MICROSCOPIC - Abnormal; Notable for the following components:   APPearance CLOUDY (*)    Hgb urine dipstick LARGE (*)    Protein, ur 30 (*)    Nitrite POSITIVE (*)    Leukocytes,Ua LARGE (*)    WBC, UA >50 (*)    Bacteria, UA FEW (*)    Non Squamous Epithelial  0-5 (*)    All other components within normal limits  URINE CULTURE  CULTURE, BLOOD (ROUTINE X 2)  CULTURE, BLOOD (ROUTINE X 2)  LACTIC ACID, PLASMA  I-STAT BETA HCG BLOOD, ED ([facility], [facility], [facility] ONLY)    EKG EKG Interpretation  Date/Time:  Thursday June 08 2021 03:40:57 EDT Ventricular Rate:  148 PR Interval:  130 QRS Duration: 68 QT Interval:  260 QTC Calculation: 408 R Axis:   49 Text Interpretation: Sinus tachycardia Cannot rule out Anterior infarct , age undetermined Abnormal ECG Since last tracing Rate faster Confirmed by Susy Frizzle [phone]) on 06/08/2021 8:10:57 AM  Radiology DG Chest 2 View  Result Date: 06/08/2021 CLINICAL DATA:  Sepsis. EXAM: CHEST - 2 VIEW COMPARISON:  Chest x-ray dated May 23, 2016. FINDINGS: The heart size and mediastinal contours are within normal limits. Both lungs are clear. The visualized skeletal structures are unremarkable. IMPRESSION: No active cardiopulmonary disease. Electronically Signed   By: Obie Dredge M.D.   On: 06/08/2021 09:02   CT ABDOMEN PELVIS W CONTRAST  Result Date: 06/08/2021 CLINICAL DATA:  Pyelonephritis, concern for kidney stones and/or abscess. EXAM: CT ABDOMEN AND PELVIS WITH CONTRAST TECHNIQUE: Multidetector CT imaging of the abdomen and pelvis was performed using the standard protocol following bolus administration of intravenous contrast. CONTRAST:  61mL OMNIPAQUE IOHEXOL 350 MG/ML SOLN COMPARISON:  CT abdomen pelvis 02/25/2021 FINDINGS: Lower chest: No acute abnormality. Hepatobiliary: No focal liver abnormality is seen. No gallstones, gallbladder wall thickening, or biliary dilatation. Pancreas: Unremarkable. No pancreatic ductal dilatation or surrounding inflammatory changes. Spleen: Normal in size without focal abnormality. Adrenals/Urinary Tract: Bilateral multifocal wedge-shaped areas of hypoenhancement in the renal cortices (series 3, images 29-36). There is no perinephric fat stranding or perinephric fluid collection.  No renal stone visualized in either renal collecting system or urinary bladder. Urinary bladder is unremarkable. Adrenal glands are unremarkable. Stomach/Bowel: Stomach is  within normal limits. No evidence of bowel wall thickening, distention, or inflammatory changes. The appendix is not directly visualized, however no inflammatory changes are noted in the right lower quadrant. Vascular/Lymphatic: No significant vascular findings are present. No enlarged abdominal or pelvic lymph nodes. Reproductive: Uterus and bilateral adnexa are unremarkable. Other: No abdominal wall hernia or abnormality. No abdominopelvic ascites. Musculoskeletal: No acute or significant osseous findings. IMPRESSION: Bilateral multifocal wedge-shaped areas of hypoenhancement in the renal cortices, consistent with bilateral multifocal pyelonephritis. No CT findings of renal or perinephric abscess. No renal stone in either renal collecting system or urinary bladder. Electronically Signed   By: Sherron Ales M.D.   On: 06/08/2021 09:27    Procedures Procedures   Medications Ordered in ED Medications  acetaminophen (TYLENOL) tablet 650 mg (has no administration in time range)  acetaminophen (TYLENOL) tablet 1,000 mg (1,000 mg Oral Given 06/08/21 0341)  lactated ringers bolus 1,000 mL (1,000 mLs Intravenous New Bag/Given 06/08/21 1058)  cefTRIAXone (ROCEPHIN) 1 g in sodium chloride 0.9 % 100 mL IVPB (0 g Intravenous Stopped 06/08/21 1057)  iohexol (OMNIPAQUE) 350 MG/ML injection 55 mL (55 mLs Intravenous Contrast Given 06/08/21 5643)    ED Course  I have reviewed the triage vital signs and the nursing notes.  Pertinent labs & imaging results that were available during my care of the patient were reviewed by me and considered in my medical decision making (see chart for details).    MDM Rules/Calculators/A&P                         MAYU RONK is a 20 y.o. female with a past medical history of asthma is presenting for right flank  pain.  Patient is hemodynamically stable in no acute distress.  Patient's vital signs are unremarkable except for being febrile.  She was given Tylenol.  Patient was recently diagnosed with a UTI 7 days ago and has been taking Keflex daily.  She presents today with 2 days of right flank pain, fever, chills and dysuria.  Her physical exam is notable for CVA tenderness on the right side.  Her CBC, CMP unremarkable.  Her UA is notable for infection.  Blood and urine culture performed.  Lactic acid was negative.  At this time concern for pyelonephritis, kidney stones, renal abscess.  CXR showed no acute cardiac or pulmonary abnormality.  CT abdomen pelvis showed bilateral multifocal pyonephritis.  CT scan showed no kidney stones or abscess.  Patient was given IV fluids and IV Rocephin.  Patient will be admitted to hospitalist for further evaluation.  Patient states compliance and understanding of the plan. I explained labs and imaging to the patient. No further questions at this time from the patient.  The plan for this patient was discussed with Dr. Bernette Mayers, who voiced agreement and who oversaw evaluation and treatment of this patient.   Final Clinical Impression(s) / ED Diagnoses Final diagnoses:  Pyelonephritis  Right flank pain    Rx / DC Orders ED Discharge Orders     None        Lottie Dawson, MD 06/08/21 1301    Pollyann Savoy, MD 06/08/21 1313

## 2021-06-09 DIAGNOSIS — J4520 Mild intermittent asthma, uncomplicated: Secondary | ICD-10-CM

## 2021-06-09 LAB — CBC
HCT: 29 % — ABNORMAL LOW (ref 36.0–46.0)
Hemoglobin: 10.4 g/dL — ABNORMAL LOW (ref 12.0–15.0)
MCH: 28.1 pg (ref 26.0–34.0)
MCHC: 35.9 g/dL (ref 30.0–36.0)
MCV: 78.4 fL — ABNORMAL LOW (ref 80.0–100.0)
Platelets: 212 10*3/uL (ref 150–400)
RBC: 3.7 MIL/uL — ABNORMAL LOW (ref 3.87–5.11)
RDW: 13.1 % (ref 11.5–15.5)
WBC: 7.9 10*3/uL (ref 4.0–10.5)
nRBC: 0 % (ref 0.0–0.2)

## 2021-06-09 LAB — BASIC METABOLIC PANEL
Anion gap: 8 (ref 5–15)
BUN: <5 mg/dL — ABNORMAL LOW (ref 6–20)
CO2: 22 mmol/L (ref 22–32)
Calcium: 8 mg/dL — ABNORMAL LOW (ref 8.9–10.3)
Chloride: 106 mmol/L (ref 98–111)
Creatinine, Ser: 0.77 mg/dL (ref 0.44–1.00)
GFR, Estimated: >60 mL/min (ref >60)
Glucose, Bld: 93 mg/dL (ref 70–99)
Potassium: 3.9 mmol/L (ref 3.5–5.1)
Sodium: 136 mmol/L (ref 135–145)

## 2021-06-09 LAB — SARS CORONAVIRUS 2 (TAT 6-24 HRS): SARS Coronavirus 2: NEGATIVE

## 2021-06-09 LAB — RPR: RPR Ser Ql: NONREACTIVE

## 2021-06-09 MED ORDER — SODIUM CHLORIDE 0.9 % IV SOLN
2.0000 g | INTRAVENOUS | Status: DC
  Administered 2021-06-09 – 2021-06-11 (×3): 2 g via INTRAVENOUS
  Filled 2021-06-09 (×3): qty 20

## 2021-06-09 MED ORDER — ENOXAPARIN SODIUM 30 MG/0.3ML IJ SOSY
30.0000 mg | PREFILLED_SYRINGE | INTRAMUSCULAR | Status: DC
  Administered 2021-06-10: 30 mg via SUBCUTANEOUS
  Filled 2021-06-09: qty 0.3

## 2021-06-09 NOTE — Progress Notes (Signed)
PROGRESS NOTE        PATIENT DETAILS Name: Sandra SimmondsZanaya Q Vance Age: 20 y.o. Sex: female Date of Birth: 01/09/2001 Admit Date: 06/07/2021 Admitting Physician Jonah BlueJennifer Yates, MD UJW:[id number]:Reese, Jocelyn LamerBetti, MD  Brief Narrative: Patient is a 20 y.o. female with no past medical history-diagnosed with UTI on 8/11 and started on Keflex-presenting with fever/bilateral flank pain and ongoing dysuria-she was found to have pyelonephritis-and subsequently admitted to the hospitalist service.  Significant events: 8/17>> presented with bilateral flank pain-diagnosed with a UTI-was already on Keflex-found to have fever/pyelonephritis.  Admitted for IV antibiotics.  Significant studies: 8/18>> CT abdomen/pelvis: Bilateral multifocal wedge-shaped areas-consistent with pyelonephritis.  Antimicrobial therapy: Rocephin: 8/18>>  Microbiology data: 8/11>> urine culture: Pansensitive E. coli. 8/17>> urine culture: Gram-negative rods 8/18>> blood culture: No growth  Procedures : None  Consults: None  DVT Prophylaxis : enoxaparin (LOVENOX) injection 30 mg Start: 06/09/21 1315  Subjective: Feels better than yesterday-still with flank pain.  Low-grade fever earlier this morning.   Assessment/Plan: Bilateral pyelonephritis with SIRS: Failed outpatient antimicrobial therapy-claims feels better-appears clinically improved with IV Rocephin.  Await cultures-continue supportive care-reassess on 8/20.    Bronchial asthma: Continue as needed bronchodilators.  Diet: Diet Order             Diet regular Room service appropriate? Yes; Fluid consistency: Thin  Diet effective now                    Code Status: Full code  Family Communication: None at bedside  Disposition Plan: Status is: Inpatient  Remains inpatient appropriate because:IV treatments appropriate due to intensity of illness or inability to take PO and Inpatient level of care appropriate due to severity of  illness  Dispo: The patient is from: Home              Anticipated d/c is to: Home              Patient currently is not medically stable to d/c.   Difficult to place patient No   Barriers to Discharge: Pyelonephritis on IV Rocephin-await culture data-needs to remain inpatient as patient failed outpatient antimicrobial therapy.  Antimicrobial agents: Anti-infectives (From admission, onward)    Start     Dose/Rate Route Frequency Ordered Stop   06/09/21 0800  cefTRIAXone (ROCEPHIN) 1 g in sodium chloride 0.9 % 100 mL IVPB  Status:  Discontinued        1 g 200 mL/hr over 30 Minutes Intravenous Every 24 hours 06/08/21 2219 06/09/21 0656   06/09/21 0800  cefTRIAXone (ROCEPHIN) 2 g in sodium chloride 0.9 % 100 mL IVPB        2 g 200 mL/hr over 30 Minutes Intravenous Every 24 hours 06/09/21 0656     06/08/21 0800  cefTRIAXone (ROCEPHIN) 1 g in sodium chloride 0.9 % 100 mL IVPB        1 g 200 mL/hr over 30 Minutes Intravenous  Once 06/08/21 0756 06/08/21 1057        Time spent: 25-minutes-Greater than 50% of this time was spent in counseling, explanation of diagnosis, planning of further management, and coordination of care.  MEDICATIONS: Scheduled Meds:  docusate sodium  100 mg Oral BID   enoxaparin (LOVENOX) injection  30 mg Subcutaneous Q24H   levonorgestrel-ethinyl estradiol  1 tablet Oral QHS   sodium chloride flush  3 mL  Intravenous Q12H   Continuous Infusions:  cefTRIAXone (ROCEPHIN)  IV 2 g (06/09/21 2409)   lactated ringers 125 mL/hr at 06/08/21 2158   PRN Meds:.acetaminophen, albuterol, bisacodyl, hydrALAZINE, HYDROcodone-acetaminophen, ibuprofen, morphine injection, ondansetron **OR** ondansetron (ZOFRAN) IV, polyethylene glycol   PHYSICAL EXAM: Vital signs: Vitals:   06/08/21 2327 06/09/21 0103 06/09/21 0602 06/09/21 1005  BP: 95/66 98/63 98/63  104/77  Pulse: (!) 108 95 100 (!) 109  Resp: 20 19 20 18   Temp: 100 F (37.8 C) 100 F (37.8 C) 99.4 F (37.4 C)  98.5 F (36.9 C)  TempSrc: Oral Oral Oral Oral  SpO2: 100% 100% 100% 100%  Weight:      Height:       Filed Weights   06/07/21 1947  Weight: 43.5 kg   Body mass index is 19.39 kg/m.   Gen Exam:Alert awake-not in any distress HEENT:atraumatic, normocephalic Chest: B/L clear to auscultation anteriorly CVS:S1S2 regular Abdomen:soft non tender, non distended.  Bilateral CVA tenderness. Extremities:no edema Neurology: Non focal Skin: no rash  I have personally reviewed following labs and imaging studies  LABORATORY DATA: CBC: Recent Labs  Lab 06/07/21 2002 06/09/21 0342  WBC 8.4 7.9  NEUTROABS 6.9  --   HGB 12.4 10.4*  HCT 35.6* 29.0*  MCV 80.4 78.4*  PLT 265 212    Basic Metabolic Panel: Recent Labs  Lab 06/07/21 2002 06/09/21 0342  NA 134* 136  K 3.8 3.9  CL 103 106  CO2 20* 22  GLUCOSE 88 93  BUN 9 <5*  CREATININE 0.99 0.77  CALCIUM 9.0 8.0*    GFR: Estimated Creatinine Clearance: 75.9 mL/min (by C-G formula based on SCr of 0.77 mg/dL).  Liver Function Tests: Recent Labs  Lab 06/07/21 2002  AST 19  ALT 11  ALKPHOS 46  BILITOT 0.9  PROT 7.3  ALBUMIN 3.7   No results for input(s): LIPASE, AMYLASE in the last 168 hours. No results for input(s): AMMONIA in the last 168 hours.  Coagulation Profile: No results for input(s): INR, PROTIME in the last 168 hours.  Cardiac Enzymes: No results for input(s): CKTOTAL, CKMB, CKMBINDEX, TROPONINI in the last 168 hours.  BNP (last 3 results) No results for input(s): PROBNP in the last 8760 hours.  Lipid Profile: No results for input(s): CHOL, HDL, LDLCALC, TRIG, CHOLHDL, LDLDIRECT in the last 72 hours.  Thyroid Function Tests: No results for input(s): TSH, T4TOTAL, FREET4, T3FREE, THYROIDAB in the last 72 hours.  Anemia Panel: No results for input(s): VITAMINB12, FOLATE, FERRITIN, TIBC, IRON, RETICCTPCT in the last 72 hours.  Urine analysis:    Component Value Date/Time   COLORURINE YELLOW  06/07/2021 1946   APPEARANCEUR CLOUDY (A) 06/07/2021 1946   LABSPEC 1.011 06/07/2021 1946   PHURINE 5.0 06/07/2021 1946   GLUCOSEU NEGATIVE 06/07/2021 1946   HGBUR LARGE (A) 06/07/2021 1946   BILIRUBINUR NEGATIVE 06/07/2021 1946   KETONESUR NEGATIVE 06/07/2021 1946   PROTEINUR 30 (A) 06/07/2021 1946   UROBILINOGEN 0.2 06/01/2021 1017   NITRITE POSITIVE (A) 06/07/2021 1946   LEUKOCYTESUR LARGE (A) 06/07/2021 1946    Sepsis Labs: Lactic Acid, Venous    Component Value Date/Time   LATICACIDVEN 1.4 06/08/2021 0801    MICROBIOLOGY: Recent Results (from the past 240 hour(s))  Urine Culture     Status: Abnormal   Collection Time: 06/01/21  3:26 PM   Specimen: Urine, Clean Catch  Result Value Ref Range Status   Specimen Description   Final    URINE, CLEAN CATCH Performed  at [facility], [address], [place], [place] [address]    Special Requests   Final    NONE Performed at [facility], [address], [place], [place] [address]    Culture >=100,000 COLONIES/mL ESCHERICHIA COLI (A)  Final   Report Status 06/04/2021 FINAL  Final   Organism ID, Bacteria ESCHERICHIA COLI (A)  Final      Susceptibility   Escherichia coli - MIC*    AMPICILLIN <=2 SENSITIVE Sensitive     CEFAZOLIN <=4 SENSITIVE Sensitive     CEFEPIME <=0.12 SENSITIVE Sensitive     CEFTRIAXONE <=0.25 SENSITIVE Sensitive     CIPROFLOXACIN <=0.25 SENSITIVE Sensitive     GENTAMICIN <=1 SENSITIVE Sensitive     IMIPENEM <=0.25 SENSITIVE Sensitive     NITROFURANTOIN <=16 SENSITIVE Sensitive     TRIMETH/SULFA <=20 SENSITIVE Sensitive     AMPICILLIN/SULBACTAM <=2 SENSITIVE Sensitive     PIP/TAZO <=4 SENSITIVE Sensitive     * >=100,000 COLONIES/mL ESCHERICHIA COLI  Urine Culture     Status: Abnormal (Preliminary result)   Collection Time: 06/07/21  7:46 PM   Specimen: Urine, Clean Catch  Result Value Ref Range Status   Specimen Description URINE, CLEAN CATCH  Final    Special Requests NONE  Final   Culture (A)  Final    >=100,000 COLONIES/mL GRAM NEGATIVE RODS SUSCEPTIBILITIES TO FOLLOW Performed at [facility], [address] [place], [place] [address]    Report Status PENDING  Incomplete  Culture, blood (Routine x 2)     Status: None (Preliminary result)   Collection Time: 06/08/21  7:59 AM   Specimen: BLOOD RIGHT HAND  Result Value Ref Range Status   Specimen Description BLOOD RIGHT HAND  Final   Special Requests   Final    BOTTLES DRAWN AEROBIC AND ANAEROBIC Blood Culture adequate volume   Culture   Final    NO GROWTH 1 DAY Performed at [facility], [address] [place], [place] [address]    Report Status PENDING  Incomplete  Culture, blood (Routine x 2)     Status: None (Preliminary result)   Collection Time: 06/08/21  8:01 AM   Specimen: BLOOD LEFT ARM  Result Value Ref Range Status   Specimen Description BLOOD LEFT ARM  Final   Special Requests   Final    BOTTLES DRAWN AEROBIC AND ANAEROBIC Blood Culture results may not be optimal due to an inadequate volume of blood received in culture bottles   Culture   Final    NO GROWTH 1 DAY Performed at [facility], [address], [place], [place] [address]    Report Status PENDING  Incomplete  SARS CORONAVIRUS 2 (TAT 6-24 HRS) Nasopharyngeal Nasopharyngeal Swab     Status: None   Collection Time: 06/08/21  4:28 PM   Specimen: Nasopharyngeal Swab  Result Value Ref Range Status   SARS Coronavirus 2 NEGATIVE NEGATIVE Final    Comment: (NOTE) SARS-CoV-2 target nucleic acids are NOT DETECTED.  The SARS-CoV-2 RNA is generally detectable in upper and lower respiratory specimens during the acute phase of infection. Negative results do not preclude SARS-CoV-2 infection, do not rule out co-infections with other pathogens, and should not be used as the sole basis for treatment or other patient management decisions. Negative results must be combined with clinical  observations, patient history, and epidemiological information. The expected result is Negative.  Fact Sheet for Patients: HairSlick.no  Fact Sheet for Healthcare Providers:  quierodirigir.com  This test is not yet approved or cleared by the [place] and  has been authorized for detection and/or diagnosis of SARS-CoV-2 by FDA under an Emergency Use Authorization (EUA). This EUA will remain  in effect (meaning this test can be used) for the duration of the COVID-19 declaration under Se ction 564(b)(1) of the Act, 21 U.S.C. section 360bbb-3(b)(1), unless the authorization is terminated or revoked sooner.  Performed at [facility], [address] [place], [place] [address]     RADIOLOGY STUDIES/RESULTS: DG Chest 2 View  Result Date: 06/08/2021 CLINICAL DATA:  Sepsis. EXAM: CHEST - 2 VIEW COMPARISON:  Chest x-ray dated May 23, 2016. FINDINGS: The heart size and mediastinal contours are within normal limits. Both lungs are clear. The visualized skeletal structures are unremarkable. IMPRESSION: No active cardiopulmonary disease. Electronically Signed   By: Obie Dredge M.D.   On: 06/08/2021 09:02   CT ABDOMEN PELVIS W CONTRAST  Result Date: 06/08/2021 CLINICAL DATA:  Pyelonephritis, concern for kidney stones and/or abscess. EXAM: CT ABDOMEN AND PELVIS WITH CONTRAST TECHNIQUE: Multidetector CT imaging of the abdomen and pelvis was performed using the standard protocol following bolus administration of intravenous contrast. CONTRAST:  42mL OMNIPAQUE IOHEXOL 350 MG/ML SOLN COMPARISON:  CT abdomen pelvis 02/25/2021 FINDINGS: Lower chest: No acute abnormality. Hepatobiliary: No focal liver abnormality is seen. No gallstones, gallbladder wall thickening, or biliary dilatation. Pancreas: Unremarkable. No pancreatic ductal dilatation or surrounding inflammatory changes. Spleen: Normal in size without focal abnormality.  Adrenals/Urinary Tract: Bilateral multifocal wedge-shaped areas of hypoenhancement in the renal cortices (series 3, images 29-36). There is no perinephric fat stranding or perinephric fluid collection. No renal stone visualized in either renal collecting system or urinary bladder. Urinary bladder is unremarkable. Adrenal glands are unremarkable. Stomach/Bowel: Stomach is within normal limits. No evidence of bowel wall thickening, distention, or inflammatory changes. The appendix is not directly visualized, however no inflammatory changes are noted in the right lower quadrant. Vascular/Lymphatic: No significant vascular findings are present. No enlarged abdominal or pelvic lymph nodes. Reproductive: Uterus and bilateral adnexa are unremarkable. Other: No abdominal wall hernia or abnormality. No abdominopelvic ascites. Musculoskeletal: No acute or significant osseous findings. IMPRESSION: Bilateral multifocal wedge-shaped areas of hypoenhancement in the renal cortices, consistent with bilateral multifocal pyelonephritis. No CT findings of renal or perinephric abscess. No renal stone in either renal collecting system or urinary bladder. Electronically Signed   By: Sherron Ales M.D.   On: 06/08/2021 09:27     LOS: 1 day   Jeoffrey Massed, MD  [facility]    To contact the attending provider between 7A-7P or the covering provider during after hours 7P-7A, please log into the web site [email or web address] and access using universal [place] password for that web site. If you do not have the password, please call the hospital operator.  06/09/2021, 10:18 AM

## 2021-06-09 NOTE — Plan of Care (Signed)
  Problem: Education: Goal: Knowledge of General Education information will improve Description: Including pain rating scale, medication(s)/side effects and non-pharmacologic comfort measures Outcome: Progressing   Problem: Clinical Measurements: Goal: Ability to maintain clinical measurements within normal limits will improve Outcome: Progressing   

## 2021-06-10 LAB — URINE CULTURE: Culture: >=100000 — AB

## 2021-06-10 NOTE — Progress Notes (Signed)
PROGRESS NOTE        PATIENT DETAILS Name: Sandra Vance Age: 20 y.o. Sex: female Date of Birth: 2001-02-18 Admit Date: 06/07/2021 Admitting Physician Jonah Blue, MD GUR:KYHCW, Jocelyn Lamer, MD  Brief Narrative: Patient is a 20 y.o. female with no past medical history-diagnosed with UTI on 8/11 and started on Keflex (claims to have been compliant)-presenting with fever/bilateral flank pain and ongoing dysuria-she was found to have pyelonephritis-and subsequently admitted to the hospitalist service.  Significant events: 8/17>> presented with bilateral flank pain-diagnosed with a UTI-was already on Keflex-found to have fever/pyelonephritis.  Admitted for IV antibiotics.  Significant studies: 8/18>> CT abdomen/pelvis: Bilateral multifocal wedge-shaped areas-consistent with pyelonephritis.  Antimicrobial therapy: Rocephin: 8/18>>  Microbiology data: 8/11>> urine culture: Pansensitive E. coli. 8/17>> urine culture: E. coli-sensitivity pending 8/18>> blood culture: No growth  Procedures : None  Consults: None  DVT Prophylaxis : enoxaparin (LOVENOX) injection 30 mg Start: 06/09/21 1315  Subjective: Overall feels better-but continues to have bilateral flank pain.  Assessment/Plan: Bilateral pyelonephritis due to E. coli with SIRS: Overall better-afebrile-awaiting sensitivity-continue IV Rocephin.  Patient failed outpatient antimicrobial therapy with Keflex-claims she was compliant.  Plan to continue IV antimicrobial therapy through today-hopefully transition to oral antibiotic takes tomorrow.    Bronchial asthma: Continue as needed bronchodilators.  Diet: Diet Order             Diet regular Room service appropriate? Yes; Fluid consistency: Thin  Diet effective now                    Code Status: Full code  Family Communication: None at bedside  Disposition Plan: Status is: Inpatient  Remains inpatient appropriate because:IV treatments  appropriate due to intensity of illness or inability to take PO and Inpatient level of care appropriate due to severity of illness  Dispo: The patient is from: Home              Anticipated d/c is to: Home              Patient currently is not medically stable to d/c.   Difficult to place patient No   Barriers to Discharge: Pyelonephritis on IV Rocephin-await culture data-needs to remain inpatient as patient failed outpatient antimicrobial therapy.  Antimicrobial agents: Anti-infectives (From admission, onward)    Start     Dose/Rate Route Frequency Ordered Stop   06/09/21 0800  cefTRIAXone (ROCEPHIN) 1 g in sodium chloride 0.9 % 100 mL IVPB  Status:  Discontinued        1 g 200 mL/hr over 30 Minutes Intravenous Every 24 hours 06/08/21 2219 06/09/21 0656   06/09/21 0800  cefTRIAXone (ROCEPHIN) 2 g in sodium chloride 0.9 % 100 mL IVPB        2 g 200 mL/hr over 30 Minutes Intravenous Every 24 hours 06/09/21 0656     06/08/21 0800  cefTRIAXone (ROCEPHIN) 1 g in sodium chloride 0.9 % 100 mL IVPB        1 g 200 mL/hr over 30 Minutes Intravenous  Once 06/08/21 0756 06/08/21 1057        Time spent: 25-minutes-Greater than 50% of this time was spent in counseling, explanation of diagnosis, planning of further management, and coordination of care.  MEDICATIONS: Scheduled Meds:  docusate sodium  100 mg Oral BID   enoxaparin (LOVENOX) injection  30 mg Subcutaneous  Q24H   levonorgestrel-ethinyl estradiol  1 tablet Oral QHS   sodium chloride flush  3 mL Intravenous Q12H   Continuous Infusions:  cefTRIAXone (ROCEPHIN)  IV 2 g (06/10/21 0817)   lactated ringers Stopped (06/09/21 1817)   PRN Meds:.acetaminophen, albuterol, bisacodyl, hydrALAZINE, HYDROcodone-acetaminophen, ibuprofen, morphine injection, ondansetron **OR** ondansetron (ZOFRAN) IV, polyethylene glycol   PHYSICAL EXAM: Vital signs: Vitals:   06/09/21 1835 06/09/21 2124 06/10/21 0500 06/10/21 0933  BP: 100/69 108/73  98/68 104/64  Pulse: 98 90 87 82  Resp: 16 20 20 16   Temp: 98.5 F (36.9 C) 98.8 F (37.1 C) 98 F (36.7 C) 97.7 F (36.5 C)  TempSrc: Oral Oral Oral Oral  SpO2: 100% 100% 100% 99%  Weight:      Height:       Filed Weights   06/07/21 1947  Weight: 43.5 kg   Body mass index is 19.39 kg/m.   Gen Exam:Alert awake-not in any distress HEENT:atraumatic, normocephalic Chest: B/L clear to auscultation anteriorly CVS:S1S2 regular Abdomen:soft non tender, non distended-continues to have bilateral CVA tenderness-but better than yesterday. Extremities:no edema Neurology: Non focal Skin: no rash   I have personally reviewed following labs and imaging studies  LABORATORY DATA: CBC: Recent Labs  Lab 06/07/21 2002 06/09/21 0342  WBC 8.4 7.9  NEUTROABS 6.9  --   HGB 12.4 10.4*  HCT 35.6* 29.0*  MCV 80.4 78.4*  PLT 265 212     Basic Metabolic Panel: Recent Labs  Lab 06/07/21 2002 06/09/21 0342  NA 134* 136  K 3.8 3.9  CL 103 106  CO2 20* 22  GLUCOSE 88 93  BUN 9 <5*  CREATININE 0.99 0.77  CALCIUM 9.0 8.0*     GFR: Estimated Creatinine Clearance: 75.9 mL/min (by C-G formula based on SCr of 0.77 mg/dL).  Liver Function Tests: Recent Labs  Lab 06/07/21 2002  AST 19  ALT 11  ALKPHOS 46  BILITOT 0.9  PROT 7.3  ALBUMIN 3.7    No results for input(s): LIPASE, AMYLASE in the last 168 hours. No results for input(s): AMMONIA in the last 168 hours.  Coagulation Profile: No results for input(s): INR, PROTIME in the last 168 hours.  Cardiac Enzymes: No results for input(s): CKTOTAL, CKMB, CKMBINDEX, TROPONINI in the last 168 hours.  BNP (last 3 results) No results for input(s): PROBNP in the last 8760 hours.  Lipid Profile: No results for input(s): CHOL, HDL, LDLCALC, TRIG, CHOLHDL, LDLDIRECT in the last 72 hours.  Thyroid Function Tests: No results for input(s): TSH, T4TOTAL, FREET4, T3FREE, THYROIDAB in the last 72 hours.  Anemia Panel: No results  for input(s): VITAMINB12, FOLATE, FERRITIN, TIBC, IRON, RETICCTPCT in the last 72 hours.  Urine analysis:    Component Value Date/Time   COLORURINE YELLOW 06/07/2021 1946   APPEARANCEUR CLOUDY (A) 06/07/2021 1946   LABSPEC 1.011 06/07/2021 1946   PHURINE 5.0 06/07/2021 1946   GLUCOSEU NEGATIVE 06/07/2021 1946   HGBUR LARGE (A) 06/07/2021 1946   BILIRUBINUR NEGATIVE 06/07/2021 1946   KETONESUR NEGATIVE 06/07/2021 1946   PROTEINUR 30 (A) 06/07/2021 1946   UROBILINOGEN 0.2 06/01/2021 1017   NITRITE POSITIVE (A) 06/07/2021 1946   LEUKOCYTESUR LARGE (A) 06/07/2021 1946    Sepsis Labs: Lactic Acid, Venous    Component Value Date/Time   LATICACIDVEN 1.4 06/08/2021 0801    MICROBIOLOGY: Recent Results (from the past 240 hour(s))  Urine Culture     Status: Abnormal   Collection Time: 06/01/21  3:26 PM   Specimen: Urine,  Clean Catch  Result Value Ref Range Status   Specimen Description   Final    URINE, CLEAN CATCH Performed at [facility], [address] [place], [place] [address]    Special Requests   Final    NONE Performed at [facility], [address] [place], [place] [address]    Culture >=100,000 COLONIES/mL ESCHERICHIA COLI (A)  Final   Report Status 06/04/2021 FINAL  Final   Organism ID, Bacteria ESCHERICHIA COLI (A)  Final      Susceptibility   Escherichia coli - MIC*    AMPICILLIN <=2 SENSITIVE Sensitive     CEFAZOLIN <=4 SENSITIVE Sensitive     CEFEPIME <=0.12 SENSITIVE Sensitive     CEFTRIAXONE <=0.25 SENSITIVE Sensitive     CIPROFLOXACIN <=0.25 SENSITIVE Sensitive     GENTAMICIN <=1 SENSITIVE Sensitive     IMIPENEM <=0.25 SENSITIVE Sensitive     NITROFURANTOIN <=16 SENSITIVE Sensitive     TRIMETH/SULFA <=20 SENSITIVE Sensitive     AMPICILLIN/SULBACTAM <=2 SENSITIVE Sensitive     PIP/TAZO <=4 SENSITIVE Sensitive     * >=100,000 COLONIES/mL ESCHERICHIA COLI  Urine Culture     Status: Abnormal   Collection Time:  06/07/21  7:46 PM   Specimen: Urine, Clean Catch  Result Value Ref Range Status   Specimen Description URINE, CLEAN CATCH  Final   Special Requests   Final    NONE Performed at [facility], [address], [place], [place] [address]    Culture >=100,000 COLONIES/mL ESCHERICHIA COLI (A)  Final   Report Status 06/10/2021 FINAL  Final   Organism ID, Bacteria ESCHERICHIA COLI (A)  Final      Susceptibility   Escherichia coli - MIC*    AMPICILLIN <=2 SENSITIVE Sensitive     CEFAZOLIN <=4 SENSITIVE Sensitive     CEFEPIME <=0.12 SENSITIVE Sensitive     CEFTRIAXONE <=0.25 SENSITIVE Sensitive     CIPROFLOXACIN <=0.25 SENSITIVE Sensitive     GENTAMICIN <=1 SENSITIVE Sensitive     IMIPENEM <=0.25 SENSITIVE Sensitive     NITROFURANTOIN <=16 SENSITIVE Sensitive     TRIMETH/SULFA <=20 SENSITIVE Sensitive     AMPICILLIN/SULBACTAM <=2 SENSITIVE Sensitive     PIP/TAZO <=4 SENSITIVE Sensitive     * >=100,000 COLONIES/mL ESCHERICHIA COLI  Culture, blood (Routine x 2)     Status: None (Preliminary result)   Collection Time: 06/08/21  7:59 AM   Specimen: BLOOD RIGHT HAND  Result Value Ref Range Status   Specimen Description BLOOD RIGHT HAND  Final   Special Requests   Final    BOTTLES DRAWN AEROBIC AND ANAEROBIC Blood Culture adequate volume   Culture   Final    NO GROWTH 2 DAYS Performed at [facility], [address], [place], [place] [address]    Report Status PENDING  Incomplete  Culture, blood (Routine x 2)     Status: None (Preliminary result)   Collection Time: 06/08/21  8:01 AM   Specimen: BLOOD LEFT ARM  Result Value Ref Range Status   Specimen Description BLOOD LEFT ARM  Final   Special Requests   Final    BOTTLES DRAWN AEROBIC AND ANAEROBIC Blood Culture results may not be optimal due to an inadequate volume of blood received in culture bottles   Culture   Final    NO GROWTH 2 DAYS Performed at [facility], [address], [place], [place] [address]     Report Status PENDING  Incomplete  SARS CORONAVIRUS 2 (TAT 6-24 HRS) Nasopharyngeal Nasopharyngeal Swab     Status: None   Collection Time: 06/08/21  4:28 PM   Specimen: Nasopharyngeal Swab  Result Value Ref Range Status   SARS Coronavirus 2 NEGATIVE NEGATIVE Final    Comment: (NOTE) SARS-CoV-2 target nucleic acids are NOT DETECTED.  The SARS-CoV-2 RNA is generally detectable in upper and lower respiratory specimens during the acute phase of infection. Negative results do not preclude SARS-CoV-2 infection, do not rule out co-infections with other pathogens, and should not be used as the sole basis for treatment or other patient management decisions. Negative results must be combined with clinical observations, patient history, and epidemiological information. The expected result is Negative.  Fact Sheet for Patients: HairSlick.no  Fact Sheet for Healthcare Providers: quierodirigir.com  This test is not yet approved or cleared by the [place] FDA and  has been authorized for detection and/or diagnosis of SARS-CoV-2 by FDA under an Emergency Use Authorization (EUA). This EUA will remain  in effect (meaning this test can be used) for the duration of the COVID-19 declaration under Se ction 564(b)(1) of the Act, 21 U.S.C. section 360bbb-3(b)(1), unless the authorization is terminated or revoked sooner.  Performed at [facility], [address], [place], [place] [address]     RADIOLOGY STUDIES/RESULTS: No results found.   LOS: 2 days   Jeoffrey Massed, MD  [facility]    To contact the attending provider between 7A-7P or the covering provider during after hours 7P-7A, please log into the web site [email or web address] and access using universal [place] password for that web site. If you do not have the password, please call the hospital operator.  06/10/2021, 9:53 AM

## 2021-06-11 MED ORDER — CIPROFLOXACIN HCL 500 MG PO TABS: 500.0000 mg | ORAL_TABLET | Freq: Two times a day (BID) | ORAL | 0 refills | Status: AC

## 2021-06-11 NOTE — Progress Notes (Signed)
Nsg Discharge Note  Admit Date:  06/07/2021 Discharge date: 06/11/2021   [name] to be D/C'd Home per MD order.  AVS completed.  Patient/caregiver able to verbalize understanding.  Discharge Medication: Allergies as of 06/11/2021   No Known Allergies      Medication List     TAKE these medications    albuterol 108 (90 Base) MCG/ACT inhaler Commonly known as: VENTOLIN HFA Inhale into the lungs every 6 (six) hours as needed for wheezing or shortness of breath.   ciprofloxacin 500 MG tablet Commonly known as: Cipro Take 1 tablet (500 mg total) by mouth 2 (two) times daily for 3 days. Start taking on: June 12, 2021   ibuprofen 200 MG tablet Commonly known as: ADVIL Take 400 mg by mouth every 6 (six) hours as needed for fever.   Vienva 0.1-20 MG-MCG tablet Generic drug: levonorgestrel-ethinyl estradiol Take 1 tablet by mouth at bedtime.        Discharge Assessment: Vitals:   06/11/21 0706 06/11/21 0912  BP: (!) 89/56 96/61  Pulse: 82 76  Resp: 20 18  Temp: 98.7 F (37.1 C) 98.6 F (37 C)  SpO2:  100%   Skin clean, dry and intact without evidence of skin break down, no evidence of skin tears noted. IV catheter discontinued intact. Site without signs and symptoms of complications - no redness or edema noted at insertion site, patient denies c/o pain - only slight tenderness at site.  Dressing with slight pressure applied.  D/c Instructions-Education: Discharge instructions given to patient/family with verbalized understanding. D/c education completed with patient/family including follow up instructions, medication list, d/c activities limitations if indicated, with other d/c instructions as indicated by MD - patient able to verbalize understanding, all questions fully answered. Patient instructed to return to ED, call 911, or call MD for any changes in condition.  Patient escorted via WC, and D/C home via private auto.  Calib Wadhwa, Tilford Pillar, RN 06/11/2021  11:15 AM

## 2021-06-11 NOTE — Plan of Care (Signed)
Discharged to home

## 2021-06-11 NOTE — Discharge Summary (Signed)
PATIENT DETAILS Name: Sandra Vance Age: 20 y.o. Sex: female Date of Birth: [birth year] MRN: [id number]. Admitting Physician: Jonah Blue, MD WUJ:[id number], Jocelyn Lamer, MD  Admit Date: 06/07/2021 Discharge date: 06/11/2021  Recommendations for Outpatient Follow-up:  Follow up with PCP in 1-2 weeks Please obtain CMP/CBC in one week   Admitted From:  Home  Disposition: Home   Home Health: No  Equipment/Devices: None  Discharge Condition: Stable  CODE STATUS: FULL CODE  Diet recommendation:  Diet Order             Diet general           Diet regular Room service appropriate? Yes; Fluid consistency: Thin  Diet effective now                    Brief Summary: Brief Narrative: Patient is a 20 y.o. female with no past medical history-diagnosed with UTI on 8/11 and started on Keflex (claims to have been compliant)-presenting with fever/bilateral flank pain and ongoing dysuria-she was found to have pyelonephritis-and subsequently admitted to the hospitalist service.   Significant events: 8/17>> presented with bilateral flank pain-diagnosed with a UTI-was already on Keflex-found to have fever/pyelonephritis.  Admitted for IV antibiotics.   Significant studies: 8/18>> CT abdomen/pelvis: Bilateral multifocal wedge-shaped areas-consistent with pyelonephritis.   Antimicrobial therapy: Rocephin: 8/18>>   Microbiology data: 8/11>> urine culture: Pansensitive E. coli. 8/17>> urine culture:  Pansensitive E. coli. 8/18>> blood culture: No growth   Procedures : None   Consults: None  Brief Hospital Course: Bilateral pyelonephritis due to E. coli with SIRS: Significantly improved this morning-treated with IV Rocephin-she failed outpatient treatment with Keflex.  She has been afebrile for almost 48 hours.  Bilateral flank pain has resolved-on exam she does not have CVA tenderness.  Plan is to switch to oral antimicrobial therapy with ciprofloxacin and discharge home.    Bronchial asthma: Continue as needed bronchodilators.  Procedures None  Discharge Diagnoses:  Principal Problem:   Pyelonephritis Active Problems:   Asthma in adult, mild intermittent, uncomplicated   Discharge Instructions:  Activity:  As tolerated   Discharge Instructions     Call MD for:  persistant nausea and vomiting   Complete by: As directed    Call MD for:  severe uncontrolled pain   Complete by: As directed    Call MD for:  temperature >100.4   Complete by: As directed    Diet general   Complete by: As directed    Discharge instructions   Complete by: As directed    Follow with Primary MD  Sandra Able, MD in 1 week  Please get a complete blood count and chemistry panel checked by your Primary MD at your next visit, and again as instructed by your Primary MD.  Get Medicines reviewed and adjusted: Please take all your medications with you for your next visit with your Primary MD  Laboratory/radiological data: Please request your Primary MD to go over all hospital tests and procedure/radiological results at the follow up, please ask your Primary MD to get all Hospital records sent to his/her office.  In some cases, they will be blood work, cultures and biopsy results pending at the time of your discharge. Please request that your primary care M.D. follows up on these results.  Also Note the following: If you experience worsening of your admission symptoms, develop shortness of breath, life threatening emergency, suicidal or homicidal thoughts you must seek medical attention immediately by calling 911 or calling your  MD immediately  if symptoms less severe.  You must read complete instructions/literature along with all the possible adverse reactions/side effects for all the Medicines you take and that have been prescribed to you. Take any new Medicines after you have completely understood and accpet all the possible adverse reactions/side effects.   Do not drive  when taking Pain medications or sleeping medications (Benzodaizepines)  Do not take more than prescribed Pain, Sleep and Anxiety Medications. It is not advisable to combine anxiety,sleep and pain medications without talking with your primary care practitioner  Special Instructions: If you have smoked or chewed Tobacco  in the last 2 yrs please stop smoking, stop any regular Alcohol  and or any Recreational drug use.  Wear Seat belts while driving.  Please note: You were cared for by a hospitalist during your hospital stay. Once you are discharged, your primary care physician will handle any further medical issues. Please note that NO REFILLS for any discharge medications will be authorized once you are discharged, as it is imperative that you return to your primary care physician (or establish a relationship with a primary care physician if you do not have one) for your post hospital discharge needs so that they can reassess your need for medications and monitor your lab values.   Increase activity slowly   Complete by: As directed       Allergies as of 06/11/2021   No Known Allergies      Medication List     TAKE these medications    albuterol 108 (90 Base) MCG/ACT inhaler Commonly known as: VENTOLIN HFA Inhale into the lungs every 6 (six) hours as needed for wheezing or shortness of breath.   ciprofloxacin 500 MG tablet Commonly known as: Cipro Take 1 tablet (500 mg total) by mouth 2 (two) times daily for 3 days. Start taking on: June 12, 2021   ibuprofen 200 MG tablet Commonly known as: ADVIL Take 400 mg by mouth every 6 (six) hours as needed for fever.   Vienva 0.1-20 MG-MCG tablet Generic drug: levonorgestrel-ethinyl estradiol Take 1 tablet by mouth at bedtime.        Follow-up Information     Sandra Able, MD. Schedule an appointment as soon as possible for a visit in 1 week(s).   Specialty: Family Medicine Why: Hospital follow up Contact information: [address] [place] [address] [phone]                No Known Allergies    Consultations:  None   Other Procedures/Studies: DG Chest 2 View  Result Date: 06/08/2021 CLINICAL DATA:  Sepsis. EXAM: CHEST - 2 VIEW COMPARISON:  Chest x-ray dated May 23, 2016. FINDINGS: The heart size and mediastinal contours are within normal limits. Both lungs are clear. The visualized skeletal structures are unremarkable. IMPRESSION: No active cardiopulmonary disease. Electronically Signed   By: Obie Dredge M.D.   On: 06/08/2021 09:02   CT ABDOMEN PELVIS W CONTRAST  Result Date: 06/08/2021 CLINICAL DATA:  Pyelonephritis, concern for kidney stones and/or abscess. EXAM: CT ABDOMEN AND PELVIS WITH CONTRAST TECHNIQUE: Multidetector CT imaging of the abdomen and pelvis was performed using the standard protocol following bolus administration of intravenous contrast. CONTRAST:  39mL OMNIPAQUE IOHEXOL 350 MG/ML SOLN COMPARISON:  CT abdomen pelvis 02/25/2021 FINDINGS: Lower chest: No acute abnormality. Hepatobiliary: No focal liver abnormality is seen. No gallstones, gallbladder wall thickening, or biliary dilatation. Pancreas: Unremarkable. No pancreatic ductal dilatation or surrounding inflammatory changes. Spleen: Normal  in size without focal abnormality. Adrenals/Urinary Tract: Bilateral multifocal wedge-shaped areas of hypoenhancement in the renal cortices (series 3, images 29-36). There is no perinephric fat stranding or perinephric fluid collection. No renal stone visualized in either renal collecting system or urinary bladder. Urinary bladder is unremarkable. Adrenal glands are unremarkable. Stomach/Bowel: Stomach is within normal limits. No evidence of bowel wall thickening, distention, or inflammatory changes. The appendix is not directly visualized, however no inflammatory changes are noted in the right lower quadrant. Vascular/Lymphatic: No significant vascular findings are present. No  enlarged abdominal or pelvic lymph nodes. Reproductive: Uterus and bilateral adnexa are unremarkable. Other: No abdominal wall hernia or abnormality. No abdominopelvic ascites. Musculoskeletal: No acute or significant osseous findings. IMPRESSION: Bilateral multifocal wedge-shaped areas of hypoenhancement in the renal cortices, consistent with bilateral multifocal pyelonephritis. No CT findings of renal or perinephric abscess. No renal stone in either renal collecting system or urinary bladder. Electronically Signed   By: Sherron Ales M.D.   On: 06/08/2021 09:27     TODAY-DAY OF DISCHARGE:  Subjective:   Sandra Vance today has no headache,no chest abdominal pain,no new weakness tingling or numbness, feels much better wants to go home today.  Her bilateral flank pain has completely resolved.  Objective:   Blood pressure 96/61, pulse 76, temperature 98.6 F (37 C), temperature source Oral, resp. rate 18, height 4\' 11"  (1.499 m), weight 43.5 kg, SpO2 100 %.  Intake/Output Summary (Last 24 hours) at 06/11/2021 0922 Last data filed at 06/11/2021 0519 Gross per 24 hour  Intake 1220 ml  Output --  Net 1220 ml   Filed Weights   06/07/21 1947  Weight: 43.5 kg    Exam: Awake Alert, Oriented *3, No new F.N deficits, Normal affect .AT,PERRAL Supple Neck,No JVD, No cervical lymphadenopathy appriciated.  Symmetrical Chest wall movement, Good air movement bilaterally, CTAB RRR,No Gallops,Rubs or new Murmurs, No Parasternal Heave +ve B.Sounds, Abd Soft, Non tender, No organomegaly appriciated, No rebound -guarding or rigidity.  No CVA tenderness today. No Cyanosis, Clubbing or edema, No new Rash or bruise   PERTINENT RADIOLOGIC STUDIES: No results found.   PERTINENT LAB RESULTS: CBC: Recent Labs    06/09/21 0342  WBC 7.9  HGB 10.4*  HCT 29.0*  PLT 212   CMET CMP     Component Value Date/Time   NA 136 06/09/2021 0342   K 3.9 06/09/2021 0342   CL 106 06/09/2021 0342   CO2 22  06/09/2021 0342   GLUCOSE 93 06/09/2021 0342   BUN <5 (L) 06/09/2021 0342   CREATININE 0.77 06/09/2021 0342   CALCIUM 8.0 (L) 06/09/2021 0342   PROT 7.3 06/07/2021 2002   ALBUMIN 3.7 06/07/2021 2002   AST 19 06/07/2021 2002   ALT 11 06/07/2021 2002   ALKPHOS 46 06/07/2021 2002   BILITOT 0.9 06/07/2021 2002   GFRNONAA >60 06/09/2021 0342   GFRAA NOT CALCULATED 05/13/2017 0057    GFR Estimated Creatinine Clearance: 75.9 mL/min (by C-G formula based on SCr of 0.77 mg/dL). No results for input(s): LIPASE, AMYLASE in the last 72 hours. No results for input(s): CKTOTAL, CKMB, CKMBINDEX, TROPONINI in the last 72 hours. Invalid input(s): POCBNP No results for input(s): DDIMER in the last 72 hours. No results for input(s): HGBA1C in the last 72 hours. No results for input(s): CHOL, HDL, LDLCALC, TRIG, CHOLHDL, LDLDIRECT in the last 72 hours. No results for input(s): TSH, T4TOTAL, T3FREE, THYROIDAB in the last 72 hours.  Invalid input(s): FREET3 No results for input(s):  VITAMINB12, FOLATE, FERRITIN, TIBC, IRON, RETICCTPCT in the last 72 hours. Coags: No results for input(s): INR in the last 72 hours.  Invalid input(s): PT Microbiology: Recent Results (from the past 240 hour(s))  Urine Culture     Status: Abnormal   Collection Time: 06/01/21  3:26 PM   Specimen: Urine, Clean Catch  Result Value Ref Range Status   Specimen Description   Final    URINE, CLEAN CATCH Performed at [facility], [address], [place], [place] [address]    Special Requests   Final    NONE Performed at [facility], [address], [place], [place] [address]    Culture >=100,000 COLONIES/mL ESCHERICHIA COLI (A)  Final   Report Status 06/04/2021 FINAL  Final   Organism ID, Bacteria ESCHERICHIA COLI (A)  Final      Susceptibility   Escherichia coli - MIC*    AMPICILLIN <=2 SENSITIVE Sensitive     CEFAZOLIN <=4 SENSITIVE Sensitive     CEFEPIME <=0.12 SENSITIVE  Sensitive     CEFTRIAXONE <=0.25 SENSITIVE Sensitive     CIPROFLOXACIN <=0.25 SENSITIVE Sensitive     GENTAMICIN <=1 SENSITIVE Sensitive     IMIPENEM <=0.25 SENSITIVE Sensitive     NITROFURANTOIN <=16 SENSITIVE Sensitive     TRIMETH/SULFA <=20 SENSITIVE Sensitive     AMPICILLIN/SULBACTAM <=2 SENSITIVE Sensitive     PIP/TAZO <=4 SENSITIVE Sensitive     * >=100,000 COLONIES/mL ESCHERICHIA COLI  Urine Culture     Status: Abnormal   Collection Time: 06/07/21  7:46 PM   Specimen: Urine, Clean Catch  Result Value Ref Range Status   Specimen Description URINE, CLEAN CATCH  Final   Special Requests   Final    NONE Performed at [facility], [address] [place], [place] [address]    Culture >=100,000 COLONIES/mL ESCHERICHIA COLI (A)  Final   Report Status 06/10/2021 FINAL  Final   Organism ID, Bacteria ESCHERICHIA COLI (A)  Final      Susceptibility   Escherichia coli - MIC*    AMPICILLIN <=2 SENSITIVE Sensitive     CEFAZOLIN <=4 SENSITIVE Sensitive     CEFEPIME <=0.12 SENSITIVE Sensitive     CEFTRIAXONE <=0.25 SENSITIVE Sensitive     CIPROFLOXACIN <=0.25 SENSITIVE Sensitive     GENTAMICIN <=1 SENSITIVE Sensitive     IMIPENEM <=0.25 SENSITIVE Sensitive     NITROFURANTOIN <=16 SENSITIVE Sensitive     TRIMETH/SULFA <=20 SENSITIVE Sensitive     AMPICILLIN/SULBACTAM <=2 SENSITIVE Sensitive     PIP/TAZO <=4 SENSITIVE Sensitive     * >=100,000 COLONIES/mL ESCHERICHIA COLI  Culture, blood (Routine x 2)     Status: None (Preliminary result)   Collection Time: 06/08/21  7:59 AM   Specimen: BLOOD RIGHT HAND  Result Value Ref Range Status   Specimen Description BLOOD RIGHT HAND  Final   Special Requests   Final    BOTTLES DRAWN AEROBIC AND ANAEROBIC Blood Culture adequate volume   Culture   Final    NO GROWTH 2 DAYS Performed at [facility], [address] [place], [place] [address]    Report Status PENDING  Incomplete  Culture, blood (Routine x 2)     Status: None  (Preliminary result)   Collection Time: 06/08/21  8:01 AM   Specimen: BLOOD LEFT ARM  Result Value Ref Range Status   Specimen Description BLOOD LEFT ARM  Final   Special Requests   Final    BOTTLES  DRAWN AEROBIC AND ANAEROBIC Blood Culture results may not be optimal due to an inadequate volume of blood received in culture bottles   Culture   Final    NO GROWTH 2 DAYS Performed at [facility], [address] [place], [place] [address]    Report Status PENDING  Incomplete  SARS CORONAVIRUS 2 (TAT 6-24 HRS) Nasopharyngeal Nasopharyngeal Swab     Status: None   Collection Time: 06/08/21  4:28 PM   Specimen: Nasopharyngeal Swab  Result Value Ref Range Status   SARS Coronavirus 2 NEGATIVE NEGATIVE Final    Comment: (NOTE) SARS-CoV-2 target nucleic acids are NOT DETECTED.  The SARS-CoV-2 RNA is generally detectable in upper and lower respiratory specimens during the acute phase of infection. Negative results do not preclude SARS-CoV-2 infection, do not rule out co-infections with other pathogens, and should not be used as the sole basis for treatment or other patient management decisions. Negative results must be combined with clinical observations, patient history, and epidemiological information. The expected result is Negative.  Fact Sheet for Patients: HairSlick.no  Fact Sheet for Healthcare Providers: quierodirigir.com  This test is not yet approved or cleared by the [place] FDA and  has been authorized for detection and/or diagnosis of SARS-CoV-2 by FDA under an Emergency Use Authorization (EUA). This EUA will remain  in effect (meaning this test can be used) for the duration of the COVID-19 declaration under Se ction 564(b)(1) of the Act, 21 U.S.C. section 360bbb-3(b)(1), unless the authorization is terminated or revoked sooner.  Performed at [facility], [address], [place], [place] [address]      FURTHER DISCHARGE INSTRUCTIONS:  Get Medicines reviewed and adjusted: Please take all your medications with you for your next visit with your Primary MD  Laboratory/radiological data: Please request your Primary MD to go over all hospital tests and procedure/radiological results at the follow up, please ask your Primary MD to get all Hospital records sent to his/her office.  In some cases, they will be blood work, cultures and biopsy results pending at the time of your discharge. Please request that your primary care M.D. goes through all the records of your hospital data and follows up on these results.  Also Note the following: If you experience worsening of your admission symptoms, develop shortness of breath, life threatening emergency, suicidal or homicidal thoughts you must seek medical attention immediately by calling 911 or calling your MD immediately  if symptoms less severe.  You must read complete instructions/literature along with all the possible adverse reactions/side effects for all the Medicines you take and that have been prescribed to you. Take any new Medicines after you have completely understood and accpet all the possible adverse reactions/side effects.   Do not drive when taking Pain medications or sleeping medications (Benzodaizepines)  Do not take more than prescribed Pain, Sleep and Anxiety Medications. It is not advisable to combine anxiety,sleep and pain medications without talking with your primary care practitioner  Special Instructions: If you have smoked or chewed Tobacco  in the last 2 yrs please stop smoking, stop any regular Alcohol  and or any Recreational drug use.  Wear Seat belts while driving.  Please note: You were cared for by a hospitalist during your hospital stay. Once you are discharged, your primary care physician will handle any further medical issues. Please note that NO REFILLS for any discharge medications will be authorized once you  are discharged, as it is imperative that you  return to your primary care physician (or establish a relationship with a primary care physician if you do not have one) for your post hospital discharge needs so that they can reassess your need for medications and monitor your lab values.  Total Time spent coordinating discharge including counseling, education and face to face time equals 35 minutes.  Signed: Kaheem Halleck 06/11/2021 9:22 AM

## 2021-06-13 LAB — CULTURE, BLOOD (ROUTINE X 2)
Culture: NO GROWTH
Culture: NO GROWTH
Special Requests: ADEQUATE

## 2021-10-20 ENCOUNTER — Ambulatory Visit (HOSPITAL_COMMUNITY)
Admission: EM | Admit: 2021-10-20 | Discharge: 2021-10-20 | Disposition: A | Discharge status: Home / Self Care | Payer: No Typology Code available for payment source | Attending: Urgent Care | Admitting: Urgent Care

## 2021-10-20 ENCOUNTER — Other Ambulatory Visit: Payer: Self-pay

## 2021-10-20 ENCOUNTER — Encounter (HOSPITAL_COMMUNITY): Payer: Self-pay

## 2021-10-20 DIAGNOSIS — K5909 Other constipation: Secondary | ICD-10-CM

## 2021-10-20 DIAGNOSIS — K602 Anal fissure, unspecified: Secondary | ICD-10-CM

## 2021-10-20 DIAGNOSIS — K625 Hemorrhage of anus and rectum: Secondary | ICD-10-CM | POA: Diagnosis not present

## 2021-10-20 MED ORDER — NIFEDIPINE 0.3 % OINTMENT: 1.0000 "application " | TOPICAL_OINTMENT | Freq: Four times a day (QID) | CUTANEOUS | 0 refills | Status: DC

## 2021-10-20 MED ORDER — LIDOCAINE (ANORECTAL) 5 % EX CREA: 1.0000 "application " | TOPICAL_CREAM | Freq: Two times a day (BID) | CUTANEOUS | 0 refills | Status: DC

## 2021-10-20 NOTE — Discharge Instructions (Signed)
Your symptoms are consistent with an anal fissure. The best treatment of these includes soft stools and prevention of constipation. You must increase your fiber intake.  You must also increase your water intake. You may try a sitz bath. You may use [facility] for discomfort, nifedipine ointment to help shrink the fissure. Please contact your PCP at earliest convenience to get a referral to gastroenterology to further assess your constipation issues.

## 2021-10-20 NOTE — ED Provider Notes (Signed)
[place]    CSN: [id number] Arrival date & time: 10/20/21  1420      History   Chief Complaint Chief Complaint  Patient presents with   Rectal Bleeding    HPI Sandra Vance is a 20 y.o. female.   20 year old female presents today with concerns regarding rectal bleeding.  She states she has seen intermittent bright red blood on the toilet paper with defecation for the past several months.  She felt this morning however that there was more blood than normal and there is severe pain in the rectal area.  She states that while using the bathroom she felt a slight sensation of nausea and lightheadedness.  She has recently seen a rheumatologist due to a plethora of symptoms, including a positive ANA.  She states she has had "problems going to the bathroom" for a long time.  She struggles with chronic constipation.  She admits to eating 1 to maybe 2 meals a day, usually fast food.  She also admits to drinking sodas and teas, and not much water.  She has used intermittent MiraLAX in the past, but states she does not use it daily because "when I do not take it I do not poop".  She has never been referred to a gastroenterologist.  She denies any blood in her underwear when not using the bathroom.  She denies any history of anemia.   Rectal Bleeding Associated symptoms: no abdominal pain and no vomiting    Past Medical History:  Diagnosis Date   Asthma     Patient Active Problem List   Diagnosis Date Noted   Pyelonephritis 06/08/2021   Asthma in adult, mild intermittent, uncomplicated [id number]   Positive ANA (antinuclear antibody) 04/07/2021   Arthralgia 04/07/2021   Weight loss 04/07/2021   Lymphadenopathy of head and neck 04/07/2021   Rash 04/07/2021   Urinary frequency 04/07/2021    History reviewed. No pertinent surgical history.  OB History   No obstetric history on file.      Home Medications    Prior to Admission medications   Medication Sig Start  Date End Date Taking? Authorizing Provider  Lidocaine, Anorectal, 5 % CREA Apply 1 application topically in the morning and at bedtime. 10/20/21  Yes Leah Thornberry L, PA  nifedipine 0.3 % ointment Place 1 application rectally 4 (four) times daily. 10/20/21  Yes Tomoya Ringwald L, PA  albuterol (PROVENTIL HFA;VENTOLIN HFA) 108 (90 BASE) MCG/ACT inhaler Inhale into the lungs every 6 (six) hours as needed for wheezing or shortness of breath.    [provider]  ibuprofen (ADVIL) 200 MG tablet Take 400 mg by mouth every 6 (six) hours as needed for fever.    [provider]  VIENVA 0.1-20 MG-MCG tablet Take 1 tablet by mouth at bedtime. 02/06/21   [provider]    Family History Family History  Problem Relation Age of Onset   Lupus Mother    Asthma Mother    Healthy Father    Autoimmune disease Sister    Asthma Sister    ADD / ADHD Sister    ADD / ADHD Sister    Asthma Sister    Asthma Brother    Lupus Paternal Aunt    Fibromyalgia Paternal Aunt    Sickle cell anemia Maternal Grandmother    Diabetes Maternal Grandfather     Social History Social History   Tobacco Use   Smoking status: Never   Smokeless tobacco: Never  Vaping Use  Vaping Use: Former  Substance Use Topics   Alcohol use: Yes    Comment: "not as much as I used to"   Drug use: No     Allergies   Patient has no known allergies.   Review of Systems Review of Systems  Constitutional: Negative.   HENT: Negative.    Eyes: Negative.   Respiratory: Negative.    Gastrointestinal:  Positive for blood in stool, constipation, hematochezia, nausea (this morning only while deficating) and rectal pain. Negative for abdominal distention, abdominal pain, diarrhea and vomiting.  Endocrine: Negative.   Genitourinary: Negative.   All other systems reviewed and are negative.   Physical Exam Triage Vital Signs ED Triage Vitals  Enc Vitals Group     BP 10/20/21 1600 115/78     Pulse Rate  10/20/21 1600 92     Resp 10/20/21 1600 17     Temp 10/20/21 1600 99.3 F (37.4 C)     Temp Source 10/20/21 1600 Oral     SpO2 10/20/21 1600 98 %     Weight --      Height --      Head Circumference --      Peak Flow --      Pain Score 10/20/21 1559 5     Pain Loc --      Pain Edu? --      Excl. in [place]? --    No data found.  Updated Vital Signs BP 115/78 (BP Location: Right Arm)    Pulse 92    Temp 99.3 F (37.4 C) (Oral)    Resp 17    LMP 09/25/2021    SpO2 98%   Visual Acuity Right Eye Distance:   Left Eye Distance:   Bilateral Distance:    Right Eye Near:   Left Eye Near:    Bilateral Near:     Physical Exam Vitals and nursing note reviewed.  Constitutional:      General: She is not in acute distress.    Appearance: Normal appearance. She is well-developed and normal weight. She is not ill-appearing, toxic-appearing or diaphoretic.  HENT:     Head: Normocephalic and atraumatic.  Eyes:     General:        Right eye: No discharge.        Left eye: No discharge.     Conjunctiva/sclera: Conjunctivae normal.  Cardiovascular:     Rate and Rhythm: Normal rate and regular rhythm.     Heart sounds: No murmur heard. Pulmonary:     Effort: Pulmonary effort is normal. No respiratory distress.     Breath sounds: Normal breath sounds.  Abdominal:     General: Abdomen is flat. Bowel sounds are normal. There is no distension.     Palpations: Abdomen is soft. There is no mass.     Tenderness: There is no abdominal tenderness. There is no right CVA tenderness, left CVA tenderness, guarding or rebound.     Hernia: No hernia is present.  Genitourinary:    Comments: No hemorrhoids present, single anal fissure noted around 1 oclock. No visible bleeding. Musculoskeletal:        General: No swelling.     Cervical back: Normal range of motion and neck supple.  Lymphadenopathy:     Cervical: No cervical adenopathy.  Skin:    General: Skin is warm and dry.     Capillary Refill:  Capillary refill takes less than 2 seconds.  Neurological:     Mental  Status: She is alert.  Psychiatric:        Mood and Affect: Mood normal.     UC Treatments / Results  Labs (all labs ordered are listed, but only abnormal results are displayed) Labs Reviewed - No data to display  EKG   Radiology No results found.  Procedures Procedures (including critical care time)  Medications Ordered in UC Medications - No data to display  Initial Impression / Assessment and Plan / UC Course  I have reviewed the triage vital signs and the nursing notes.  Pertinent labs & imaging results that were available during my care of the patient were reviewed by me and considered in my medical decision making (see chart for details).     Anal fissure -secondary to constipation.  Must increase water intake, increase fiber.  Increase natural fruits and vegetables.  Consider sitz bath.  Take MiraLAX daily.  May use RectiCare for discomfort, nifedipine ointment for treatment.  Need work-up from gastroenterology, call PCP to get referral. Rectal bleeding -secondary to #1, and straining. Constipation -likely due to poor p.o. intake and minimal water.  Daily MiraLAX, follow-up with GI.  Final Clinical Impressions(s) / UC Diagnoses   Final diagnoses:  Anal fissure  Rectal bleeding  Chronic constipation     Discharge Instructions      Your symptoms are consistent with an anal fissure. The best treatment of these includes soft stools and prevention of constipation. You must increase your fiber intake.  You must also increase your water intake. You may try a sitz bath. You may use RectiCare for discomfort, nifedipine ointment to help shrink the fissure. Please contact your PCP at earliest convenience to get a referral to gastroenterology to further assess your constipation issues.     ED Prescriptions     Medication Sig Dispense Auth. Provider   Lidocaine, Anorectal, 5 % CREA Apply 1  application topically in the morning and at bedtime. 28 g Parys Elenbaas L, PA   nifedipine 0.3 % ointment Place 1 application rectally 4 (four) times daily. 30 g Anikka Marsan L, [place]      PDMP not reviewed this encounter.   Maretta Bees, [place] 10/20/21 1658

## 2021-10-20 NOTE — ED Triage Notes (Signed)
Pt presents with chronic blood in stool for months.

## 2021-11-07 ENCOUNTER — Encounter (HOSPITAL_COMMUNITY): Payer: Self-pay

## 2021-11-07 ENCOUNTER — Other Ambulatory Visit: Payer: Self-pay

## 2021-11-07 ENCOUNTER — Emergency Department (HOSPITAL_COMMUNITY): Payer: No Typology Code available for payment source

## 2021-11-07 ENCOUNTER — Emergency Department (HOSPITAL_COMMUNITY)
Admission: EM | Admit: 2021-11-07 | Discharge: 2021-11-07 | Disposition: A | Discharge status: Home / Self Care | Payer: No Typology Code available for payment source | Attending: Emergency Medicine | Admitting: Emergency Medicine

## 2021-11-07 DIAGNOSIS — R109 Unspecified abdominal pain: Secondary | ICD-10-CM

## 2021-11-07 DIAGNOSIS — E876 Hypokalemia: Secondary | ICD-10-CM | POA: Insufficient documentation

## 2021-11-07 DIAGNOSIS — R197 Diarrhea, unspecified: Secondary | ICD-10-CM | POA: Diagnosis not present

## 2021-11-07 DIAGNOSIS — R319 Hematuria, unspecified: Secondary | ICD-10-CM | POA: Diagnosis not present

## 2021-11-07 DIAGNOSIS — E878 Other disorders of electrolyte and fluid balance, not elsewhere classified: Secondary | ICD-10-CM | POA: Insufficient documentation

## 2021-11-07 DIAGNOSIS — R110 Nausea: Secondary | ICD-10-CM | POA: Diagnosis not present

## 2021-11-07 DIAGNOSIS — E871 Hypo-osmolality and hyponatremia: Secondary | ICD-10-CM | POA: Diagnosis not present

## 2021-11-07 DIAGNOSIS — R1032 Left lower quadrant pain: Secondary | ICD-10-CM | POA: Diagnosis present

## 2021-11-07 DIAGNOSIS — Z20822 Contact with and (suspected) exposure to covid-19: Secondary | ICD-10-CM | POA: Diagnosis not present

## 2021-11-07 DIAGNOSIS — J45909 Unspecified asthma, uncomplicated: Secondary | ICD-10-CM | POA: Diagnosis not present

## 2021-11-07 LAB — URINALYSIS, ROUTINE W REFLEX MICROSCOPIC
Bacteria, UA: NONE SEEN
Bilirubin Urine: NEGATIVE
Glucose, UA: NEGATIVE mg/dL
Ketones, ur: 5 mg/dL — AB
Leukocytes,Ua: NEGATIVE
Nitrite: NEGATIVE
Protein, ur: 30 mg/dL — AB
Specific Gravity, Urine: 1.03 (ref 1.005–1.030)
pH: 5 (ref 5.0–8.0)

## 2021-11-07 LAB — CBC
HCT: 38.1 % (ref 36.0–46.0)
Hemoglobin: 13.1 g/dL (ref 12.0–15.0)
MCH: 29.1 pg (ref 26.0–34.0)
MCHC: 34.4 g/dL (ref 30.0–36.0)
MCV: 84.7 fL (ref 80.0–100.0)
Platelets: 161 10*3/uL (ref 150–400)
RBC: 4.5 MIL/uL (ref 3.87–5.11)
RDW: 13.2 % (ref 11.5–15.5)
WBC: 4.5 10*3/uL (ref 4.0–10.5)
nRBC: 0 % (ref 0.0–0.2)

## 2021-11-07 LAB — COMPREHENSIVE METABOLIC PANEL
ALT: 29 U/L (ref 0–44)
AST: 27 U/L (ref 15–41)
Albumin: 4.1 g/dL (ref 3.5–5.0)
Alkaline Phosphatase: 49 U/L (ref 38–126)
Anion gap: 7 (ref 5–15)
BUN: 15 mg/dL (ref 6–20)
CO2: 19 mmol/L — ABNORMAL LOW (ref 22–32)
Calcium: 8.3 mg/dL — ABNORMAL LOW (ref 8.9–10.3)
Chloride: 107 mmol/L (ref 98–111)
Creatinine, Ser: 1 mg/dL (ref 0.44–1.00)
GFR, Estimated: >60 mL/min (ref >60)
Glucose, Bld: 83 mg/dL (ref 70–99)
Potassium: 3.4 mmol/L — ABNORMAL LOW (ref 3.5–5.1)
Sodium: 133 mmol/L — ABNORMAL LOW (ref 135–145)
Total Bilirubin: 0.4 mg/dL (ref 0.3–1.2)
Total Protein: 7.3 g/dL (ref 6.5–8.1)

## 2021-11-07 LAB — RESP PANEL BY RT-PCR (FLU A&B, COVID) ARPGX2
Influenza A by PCR: NEGATIVE
Influenza B by PCR: NEGATIVE
SARS Coronavirus 2 by RT PCR: NEGATIVE

## 2021-11-07 LAB — LIPASE, BLOOD: Lipase: 23 U/L (ref 11–51)

## 2021-11-07 LAB — I-STAT BETA HCG BLOOD, ED (MC, WL, AP ONLY): I-stat hCG, quantitative: <5 m[IU]/mL (ref <5)

## 2021-11-07 MED ORDER — MORPHINE SULFATE (PF) 4 MG/ML IV SOLN
4.0000 mg | Freq: Once | INTRAVENOUS | Status: AC
  Administered 2021-11-07: 4 mg via INTRAMUSCULAR
  Filled 2021-11-07: qty 1

## 2021-11-07 MED ORDER — ONDANSETRON 4 MG PO TBDP
4.0000 mg | ORAL_TABLET | Freq: Once | ORAL | Status: AC
  Administered 2021-11-07: 4 mg via ORAL
  Filled 2021-11-07: qty 1

## 2021-11-07 MED ORDER — ONDANSETRON 4 MG PO TBDP: 4.0000 mg | ORAL_TABLET | Freq: Three times a day (TID) | ORAL | 0 refills | Status: DC | PRN

## 2021-11-07 NOTE — ED Provider Triage Note (Signed)
Emergency Medicine Provider Triage Evaluation Note  Sandra Vance , a 21 y.o. female  was evaluated in triage.  Pt complains of left lower quadrant abdominal pain, right lumbar back pain, and diarrhea.  Symptoms started on Saturday.  Patient states that on Saturday she also had fevers and chills.  No fevers and chills since then.  Patient denies any recent antibiotic use or travel.  Review of Systems  Positive: Left lower quadrant abdominal pain, right lumbar back pain, diarrhea, fever, chills Negative: Dysuria, hematuria, urinary urgency/frequency, vaginal pain, vaginal bleeding, vaginal discharge, blood in stool, melena, nausea, vomiting, cough, rhinorrhea, nasal congestion  Physical Exam  BP 112/79 (BP Location: Right Arm)    Pulse 99    Temp 97.7 F (36.5 C) (Oral)    Resp 16    Ht 4\' 11"  (1.499 m)    Wt 43.1 kg    LMP 10/21/2021 (Exact Date)    SpO2 100%    BMI 19.19 kg/m  Gen:   Awake, no distress   Resp:  Normal effort  MSK:   Moves extremities without difficulty  Other:  Abdomen soft, nondistended, tenderness to left lower quadrant, no guarding or rebound tenderness.  Medical Decision Making  Medically screening exam initiated at 1:10 PM.  Appropriate orders placed.  [name] was informed that the remainder of the evaluation will be completed by another provider, this initial triage assessment does not replace that evaluation, and the importance of remaining in the ED until their evaluation is complete.     Loni Beckwith, [place] 11/07/21 1311

## 2021-11-07 NOTE — ED Triage Notes (Signed)
Patient c/o LLQ abdominal pain, right lower back pain, and diarrhea x 4 days. Patient states she vomited 2 days ago, but no more.

## 2021-11-07 NOTE — Discharge Instructions (Addendum)
You came to the emergency department today to be evaluated for your diarrhea and abdominal pain.  The CT scan of your abdomen and pelvis did not show any signs of any acute abnormalities or kidney stones.  The exact cause of your pain is unknown at this time.  Please follow-up with your primary care doctor for follow-up.  I have given you prescription for the antinausea medication Zofran, take 1 pill every 8 hours as needed for nausea or vomiting.  Get help right away if: Your pain does not go away as soon as your health care provider told you to expect. You cannot stop vomiting. Your pain is only in areas of the abdomen, such as the right side or the left lower portion of the abdomen. Pain on the right side could be caused by appendicitis. You have bloody or black stools, or stools that look like tar. You have severe pain, cramping, or bloating in your abdomen. You have signs of dehydration, such as: Dark urine, very little urine, or no urine. Cracked lips. Dry mouth. Sunken eyes. Sleepiness. Weakness. You have trouble breathing or chest pain.

## 2021-11-07 NOTE — ED Notes (Signed)
Patient transported to CT 

## 2021-11-07 NOTE — ED Provider Notes (Addendum)
[facility] DEPT Provider Note   CSN: [id number] Arrival date & time: 11/07/21  1014     History  Chief Complaint  Patient presents with   Abdominal Pain   Diarrhea    Sandra Vance is a 21 y.o. female with past medical history of asthma, pyelonephritis, no fissures.  Presents emergency department with a chief complaint of diarrhea, left lower quadrant abdominal pain, and right flank pain.  Patient states that symptoms have been present over the last 4 days.  Patient rates her pain 7/10 the pain scale.  Pain is worse with touch.  Patient denies any other aggravating factors.  Patient has not tried any modalities to alleviate her symptoms.  Patient endorses nausea but denies any vomiting.  Patient endorses having 1 episode of hematuria earlier today.  Patient denies any recent camping, travel, or antibiotic use.  Patient endorses occasional alcohol use, however states that she has not had any recently.  Denies any illicit drug use.  Patient is sexually active in a mutually monogamous relationship with a female partner.  Patient states that they do not use condoms during sexual intercourse however she is on oral contraceptive birth control.  LMP 12/31.  Patient denies any history of abdominal surgeries.   Abdominal Pain Associated symptoms: diarrhea, hematuria and nausea   Associated symptoms: no chest pain, no chills, no constipation, no dysuria, no fever, no shortness of breath, no vaginal bleeding, no vaginal discharge and no vomiting   Diarrhea Associated symptoms: abdominal pain   Associated symptoms: no chills, no fever, no headaches and no vomiting       Home Medications Prior to Admission medications   Medication Sig Start Date End Date Taking? Authorizing Provider  albuterol (PROVENTIL HFA;VENTOLIN HFA) 108 (90 BASE) MCG/ACT inhaler Inhale into the lungs every 6 (six) hours as needed for wheezing or shortness of breath.    [provider]   ibuprofen (ADVIL) 200 MG tablet Take 400 mg by mouth every 6 (six) hours as needed for fever.    [provider]  Lidocaine, Anorectal, 5 % CREA Apply 1 application topically in the morning and at bedtime. 10/20/21   Crain, Whitney L, PA  nifedipine 0.3 % ointment Place 1 application rectally 4 (four) times daily. 10/20/21   Crain, Whitney L, PA  VIENVA 0.1-20 MG-MCG tablet Take 1 tablet by mouth at bedtime. 02/06/21   [provider]      Allergies    Shellfish-derived products    Review of Systems   Review of Systems  Constitutional:  Negative for chills and fever.  Eyes:  Negative for visual disturbance.  Respiratory:  Negative for shortness of breath.   Cardiovascular:  Negative for chest pain.  Gastrointestinal:  Positive for abdominal pain, diarrhea and nausea. Negative for abdominal distention, anal bleeding, blood in stool, constipation, rectal pain and vomiting.  Genitourinary:  Positive for flank pain and hematuria. Negative for decreased urine volume, difficulty urinating, dysuria, frequency, genital sores, pelvic pain, urgency, vaginal bleeding, vaginal discharge and vaginal pain.  Musculoskeletal:  Negative for back pain and neck pain.  Skin:  Negative for color change and rash.  Neurological:  Negative for dizziness, syncope, light-headedness and headaches.  Psychiatric/Behavioral:  Negative for confusion.    Physical Exam Updated Vital Signs BP 103/65 (BP Location: Right Arm)    Pulse 98    Temp 98.1 F (36.7 C) (Oral)    Resp 18    Ht 4\' 11"  (1.499 m)  Wt 43.1 kg    LMP 10/21/2021 (Exact Date)    SpO2 100%    BMI 19.19 kg/m  Physical Exam Vitals and nursing note reviewed.  Constitutional:      General: She is not in acute distress.    Appearance: She is not ill-appearing, toxic-appearing or diaphoretic.  HENT:     Head: Normocephalic.  Eyes:     General: No scleral icterus.       Right eye: No discharge.        Left eye: No discharge.   Cardiovascular:     Rate and Rhythm: Normal rate.  Pulmonary:     Effort: Pulmonary effort is normal.  Abdominal:     General: Abdomen is flat. Bowel sounds are normal. There is no distension. There are no signs of injury.     Palpations: Abdomen is soft. There is no mass or pulsatile mass.     Tenderness: There is abdominal tenderness in the left lower quadrant. There is right CVA tenderness. There is no guarding or rebound.     Hernia: There is no hernia in the umbilical area or ventral area.  Skin:    General: Skin is warm and dry.  Neurological:     General: No focal deficit present.     Mental Status: She is alert.  Psychiatric:        Behavior: Behavior is cooperative.    ED Results / Procedures / Treatments   Labs (all labs ordered are listed, but only abnormal results are displayed) Labs Reviewed  COMPREHENSIVE METABOLIC PANEL - Abnormal; Notable for the following components:      Result Value   Sodium 133 (*)    Potassium 3.4 (*)    CO2 19 (*)    Calcium 8.3 (*)    All other components within normal limits  URINALYSIS, ROUTINE W REFLEX MICROSCOPIC - Abnormal; Notable for the following components:   Color, Urine AMBER (*)    Hgb urine dipstick MODERATE (*)    Ketones, ur 5 (*)    Protein, ur 30 (*)    All other components within normal limits  RESP PANEL BY RT-PCR (FLU A&B, COVID) ARPGX2  LIPASE, BLOOD  CBC  I-STAT BETA HCG BLOOD, ED ([facility], [facility], [facility] ONLY)    EKG None  Radiology CT Renal Stone Study  Result Date: 11/07/2021 CLINICAL DATA:  Left-sided abdominal pain and flank pain. EXAM: CT ABDOMEN AND PELVIS WITHOUT CONTRAST TECHNIQUE: Multidetector CT imaging of the abdomen and pelvis was performed following the standard protocol without IV contrast. RADIATION DOSE REDUCTION: This exam was performed according to the departmental dose-optimization program which includes automated exposure control, adjustment of the mA and/or kV according to patient size and/or use  of iterative reconstruction technique. COMPARISON:  CT abdomen and pelvis 06/08/2021. FINDINGS: Lower chest: No acute abnormality. Hepatobiliary: No focal liver abnormality is seen. No gallstones, gallbladder wall thickening, or biliary dilatation. Pancreas: Unremarkable. No pancreatic ductal dilatation or surrounding inflammatory changes. Spleen: Normal in size without focal abnormality. Adrenals/Urinary Tract: Adrenal glands are unremarkable. Kidneys are normal, without renal calculi, focal lesion, or hydronephrosis. Bladder is unremarkable. Stomach/Bowel: Stomach is within normal limits. No evidence of bowel wall thickening, distention, or inflammatory changes. The appendix is not seen. Vascular/Lymphatic: No significant vascular findings are present. No enlarged abdominal or pelvic lymph nodes. Reproductive: Uterus and bilateral adnexa are unremarkable. Other: No abdominal wall hernia or abnormality. No abdominopelvic ascites. Musculoskeletal: No acute or significant osseous findings. IMPRESSION: 1. No acute  localizing process in the abdomen or pelvis. Electronically Signed   By: Ronney Asters M.D.   On: 11/07/2021 19:58    Procedures Procedures    Medications Ordered in ED Medications  morphine 4 MG/ML injection 4 mg (4 mg Intramuscular Given 11/07/21 1940)  ondansetron (ZOFRAN-ODT) disintegrating tablet 4 mg (4 mg Oral Given 11/07/21 1944)    ED Course/ Medical Decision Making/ A&P                           Medical Decision Making Amount and/or Complexity of Data Reviewed Labs: ordered. Radiology: ordered.  Risk Prescription drug management.   This patient presents to the ED for concern of left lower quadrant abdominal pain and right flank pain, this involves an extensive number of treatment options, and is a complaint that carries with it a high risk of complications and morbidity.  The differential diagnosis includes but is not limited to renal/ureteral calculus, urinary tract  infection, pyelonephritis, gastroenteritis, diverticulitis.   Co morbidities that complicate the patient evaluation  N/a   Additional history obtained:  External records from outside source obtained and reviewed including previous provider limits, CT imaging, and lab results   Lab Tests:  I Ordered, and personally interpreted labs.  The pertinent results include:   Beta-hCG negative CBC unremarkable Lipase within normal limit CMP shows hypokalemia with potassium at 3.4, sodium decreased at 133, bicarb decreased at 19 Urinalysis shows bacteria none seen, leukocyte negative, nitrite negative, WBC 0-5, RBC 11-20, protein 30, ketones 5 Per chart review patient has had 30 protein noticed on previous urinalysis as well.  Suspect that ketones present are likely secondary to decreased p.o. intake.   Imaging Studies ordered:  I ordered imaging studies including CT renal study I independently visualized and interpreted imaging which showed no acute abdominal or pelvic abnormalities I agree with the radiologist interpretation   Medicines ordered and prescription drug management:  I ordered medication including Zofran for nausea.  Morphine for pain management Reevaluation of the patient after these medicines showed that the patient improved I have reviewed the patients home medicines and have made adjustments as needed  Problem List / ED Course:  Diarrhea, abdominal pain, and flank pain Patient has right CVA tenderness with blood noted in urine.  Concern for possible ureteral calculus.  CT renal study was ordered which showed no ureteral or renal calculus.  No abnormalities noted to kidney.  Additionally CT renal study shows no acute abnormalities within abdomen or pelvis. Patient was given Zofran and morphine for pain management with improvement in her symptoms. Patient has no recent antibiotic use; low suspicion for C. difficile at this time.  Patient has no recent travel or blood in  stool; low suspicion for infectious diarrhea at this time.  Patient has no recent camping; low suspicion for Giardia at this time. Patient denies any vaginal pain, vaginal bleeding, or vaginal discharge.  Patient pregnancy test negative.  Suspicion for ectopic pregnancy or PID causing patient's symptoms.  Symptom onset was gradual; low suspicion for ovarian torsion at this time. Patient hemodynamically stable.  Will prescribe patient with Zofran and have her follow-up with PCP in outpatient setting.   Reevaluation:  After the interventions noted above, I reevaluated the patient and found that they have :improved   Dispostion:  After consideration of the diagnostic results and the patients response to treatment, I feel that the patent would benefit from discharge and follow-up with PCP.  Final Clinical Impression(s) / ED Diagnoses Final diagnoses:  Diarrhea, unspecified type  Left lower quadrant abdominal pain  Right flank pain    Rx / DC Orders ED Discharge Orders          Ordered    ondansetron (ZOFRAN-ODT) 4 MG disintegrating tablet  Every 8 hours PRN        11/07/21 2051              Loni Beckwith, PA-C 11/08/21 0053    Loni Beckwith, PA-C 11/08/21 [id number]    Wyvonnia Dusky, MD 11/08/21 1038

## 2021-11-28 ENCOUNTER — Ambulatory Visit (HOSPITAL_COMMUNITY): Payer: Self-pay

## 2021-12-14 ENCOUNTER — Ambulatory Visit (INDEPENDENT_AMBULATORY_CARE_PROVIDER_SITE_OTHER): Payer: No Typology Code available for payment source | Admitting: Family Medicine

## 2021-12-14 ENCOUNTER — Encounter: Payer: Self-pay | Admitting: Family Medicine

## 2021-12-14 ENCOUNTER — Other Ambulatory Visit: Payer: Self-pay

## 2021-12-14 VITALS — BP 104/70 | HR 80 | Temp 98.1°F | Resp 16 | Ht 60.0 in | Wt 95.4 lb

## 2021-12-14 DIAGNOSIS — Z7689 Persons encountering health services in other specified circumstances: Secondary | ICD-10-CM

## 2021-12-14 DIAGNOSIS — K5900 Constipation, unspecified: Secondary | ICD-10-CM | POA: Diagnosis not present

## 2021-12-14 DIAGNOSIS — K921 Melena: Secondary | ICD-10-CM

## 2021-12-14 NOTE — Progress Notes (Signed)
Patient is new to practice. Patient c/o having trouble with going to the bathroom. Patient said that she goes 3 x a day and she 's not going for 3/4 days.  Patient said that her stomach is swollen on the left said Patient said that she has had a fissure before. Patient has questions and concerns.

## 2021-12-14 NOTE — Progress Notes (Signed)
New Patient Office Visit  Subjective:  Patient ID: Sandra Vance, female    DOB: [birth year]  Age: 21 y.o. MRN: [id number]  CC:  Chief Complaint  Patient presents with   Constipation    HPI DONNELL BEAUCHAMP presents for to establish care and for complaint of constipation with intermittent blood in stools. She reports that she drinks adequate water as well as utilizes miralax. She has had sx for several months.   Past Medical History:  Diagnosis Date   Asthma     History reviewed. No pertinent surgical history.  Family History  Problem Relation Age of Onset   Lupus Mother    Asthma Mother    Healthy Father    Autoimmune disease Sister    Asthma Sister    ADD / ADHD Sister    ADD / ADHD Sister    Asthma Sister    Asthma Brother    Lupus Paternal Aunt    Fibromyalgia Paternal Aunt    Sickle cell anemia Maternal Grandmother    Diabetes Maternal Grandfather     Social History   Socioeconomic History   Marital status: Single    Spouse name: Not on file   Number of children: Not on file   Years of education: Not on file   Highest education level: Not on file  Occupational History   Occupation: fast Paediatric nurse and bar  Tobacco Use   Smoking status: Never   Smokeless tobacco: Never  Vaping Use   Vaping Use: Former  Substance and Sexual Activity   Alcohol use: Yes   Drug use: No   Sexual activity: Yes    Birth control/protection: Pill  Other Topics Concern   Not on file  Social History Narrative   Not on file   Social Determinants of Health   Financial Resource Strain: Not on file  Food Insecurity: Not on file  Transportation Needs: Not on file  Physical Activity: Not on file  Stress: Not on file  Social Connections: Not on file  Intimate Partner Violence: Not on file    ROS Review of Systems  Gastrointestinal:  Positive for abdominal pain, blood in stool and constipation. Negative for nausea, rectal pain and vomiting.  All other systems  reviewed and are negative.  Objective:   Today's Vitals: BP 104/70    Pulse 80    Temp 98.1 F (36.7 C) (Oral)    Resp 16    Ht 5' (1.524 m)    Wt 95 lb 6.4 oz (43.3 kg)    SpO2 100%    BMI 18.63 kg/m   Physical Exam Vitals and nursing note reviewed.  Constitutional:      General: She is not in acute distress. Cardiovascular:     Rate and Rhythm: Normal rate and regular rhythm.  Pulmonary:     Effort: Pulmonary effort is normal.     Breath sounds: Normal breath sounds.  Abdominal:     Palpations: Abdomen is soft.     Tenderness: There is no abdominal tenderness.  Neurological:     General: No focal deficit present.     Mental Status: She is alert and oriented to person, place, and time.    Assessment & Plan:   1. Constipation, unspecified constipation type Referral to gi for further eval/mgt - Ambulatory referral to Gastroenterology  2. Blood in stool As above - Ambulatory referral to Gastroenterology  3. Encounter to establish care     Outpatient Encounter Medications as of  12/14/2021  Medication Sig   norgestimate-ethinyl estradiol (SPRINTEC 28) 0.25-35 MG-MCG tablet Take 1 tablet by mouth daily.   [DISCONTINUED] albuterol (PROVENTIL HFA;VENTOLIN HFA) 108 (90 BASE) MCG/ACT inhaler Inhale into the lungs every 6 (six) hours as needed for wheezing or shortness of breath.   [DISCONTINUED] ibuprofen (ADVIL) 200 MG tablet Take 400 mg by mouth every 6 (six) hours as needed for fever.   [DISCONTINUED] Lidocaine, Anorectal, 5 % CREA Apply 1 application topically in the morning and at bedtime.   [DISCONTINUED] nifedipine 0.3 % ointment Place 1 application rectally 4 (four) times daily.   [DISCONTINUED] ondansetron (ZOFRAN-ODT) 4 MG disintegrating tablet Take 1 tablet (4 mg total) by mouth every 8 (eight) hours as needed for nausea or vomiting.   [DISCONTINUED] VIENVA 0.1-20 MG-MCG tablet Take 1 tablet by mouth at bedtime.   No facility-administered encounter medications on  file as of 12/14/2021.    Follow-up: No follow-ups on file.   Tommie Raymond, MD

## 2021-12-22 ENCOUNTER — Encounter: Payer: Self-pay | Admitting: Physician Assistant

## 2022-01-03 NOTE — Progress Notes (Signed)
? ? ? ?01/09/2022 ?[name] ?[id number] ?Oct 15, 2001 ? ? ?ASSESSMENT AND PLAN:  ? ?Constipation, unspecified constipation type ?After pyelonephritis- could be flora disruption ?Given restora samples ?Has tried miralax without helped, will do sample of linzess 72 mcg, can do this versus if helps.  ?Will get labs ?-     CBC With Differential; Future ?-     Comprehensive metabolic panel; Future ?-     TSH; Future ?-     C-reactive protein; Future ?-     Sedimentation rate; Future ? ?Gastroesophageal reflux disease without esophagitis ?-     C-reactive protein; Future ?-     Sedimentation rate; Future ?-     H. pylori antibody, IgG; Future ?- check labs, information given, can do trial of PPI after H pylori ? ?Internal hemorrhoid ?-     hydrocortisone (ANUSOL-HC) 2.5 % rectal cream; Place 1 application. rectally 2 (two) times daily as needed for hemorrhoids. ?-Sitz baths, increase fiber, increase water, need to fix toilet habits.  ?-Hydrocortisone supp give and external cream sent in.  ?-We discussed hemorrhoid banding here in the office or surgical treatment if hemorrhoids do not improve with conservative treatment. ?- follow up for evaluation ? ?History of pyelonephritis ?Still some right flank pain, nausea, states has microhematuria ?Encourage to follow up with urologist, will get labs ?If elevated WBC can proceed with CT ? ?Patient Care Team: ?Georganna Skeans, MD as PCP - General (Family Medicine) ? ?HISTORY OF PRESENT ILLNESS: ?21 y.o. female with a past medical history of asthma, pyelonephritis and others listed below, presents for Blood in stool and constipation.  ? ?Patient seen in the ER on 01/17 for diarrhea and LLQ pain, had vomiting after eating cook out.  ?She had hypokalemia, no anemia or leukocytosis.  ?Normal LFTs ?06/08/2021 CT AB and pelvis with contrast-normal liver, no gallstones, no biliary dilatation, unremarkable pancreas, normal spleen, normal stomach, normal bowel without thickening or  inflammatory changes.  Positive for pyelonephritis. ? ?She has had constipation since August. Had pyelonephritis, had ABX and pain medications, started to have constipation and blood in stool.  ?She has straining with every BM. Has BRB blood on TP and stool.  ?She has rectal pain, has history of anal fissure Dec. No rectal burning or itching.  ?Can have BM every 2-3 days.  ?Has been taking miralax on and off x august, OTC nausea medication.  ?She has left flank pain, has microhematuria since may, suppose to see urologist.  ?She has nausea and GERD, no vomiting. No dysphagia.  ?Worse with spicy foods, milk products.  ?No NSAIDS, no ETOH, no smoking.  ? ?Maternal great grandfather with colon cancer in 39s.  ?Mom and GM with gallbladder removal.  ? ?External labs and notes reviewed this visit: ?CBC  11/07/2021  ?HGB 13.1 MCV 84.7 without evidence of anemia ?WBC 4.5 Platelets 161 ?Kidney function 11/07/2021  ?BUN 15 Cr 1.00  ?GFR >60  ?Potassium 3.4   ?LFTs 11/07/2021  ?AST 27 ALT 29 ?Alkphos 49 TBili 0.4 ?11/07/2021 LIPASE 23 ? ?Current Medications:  ? ?Current Outpatient Medications (Endocrine & Metabolic):  ?  norgestimate-ethinyl estradiol (ORTHO-CYCLEN) 0.25-35 MG-MCG tablet, Take 1 tablet by mouth daily. ? ? ? ? ? ?Current Outpatient Medications (Other):  ?  hydrocortisone (ANUSOL-HC) 2.5 % rectal cream, Place 1 application. rectally 2 (two) times daily as needed for hemorrhoids. ? ? ?Medical History:  ?Past Medical History:  ?Diagnosis Date  ? Asthma   ? ?Allergies:  ?Allergies  ?Allergen  Reactions  ? Shellfish-Derived Products   ? Raspberry Itching and Rash  ?  ? ?Surgical History:  ?She  has no past surgical history on file. ?Family History:  ?Her family history includes ADD / ADHD in her sister and sister; Asthma in her brother, mother, sister, and sister; Autoimmune disease in her sister; Breast cancer in her maternal great-grandmother; Colon cancer in her maternal great-grandfather; Diabetes in her maternal aunt  and maternal grandfather; Fibromyalgia in her paternal aunt; Healthy in her father; Irritable bowel syndrome in her mother; Lupus in her mother and paternal aunt; Prostate cancer in her paternal grandfather; Sickle cell anemia in her maternal grandmother. ?Social History:  ? reports that she has never smoked. She has never used smokeless tobacco. She reports current alcohol use. She reports that she does not use drugs. ? ?REVIEW OF SYSTEMS  : All other systems reviewed and negative except where noted in the History of Present Illness. ? ? ?PHYSICAL EXAM: ?BP 110/74   Pulse 60   Ht 5' (1.524 m)   Wt 96 lb 12.8 oz (43.9 kg)   LMP 12/29/2021   SpO2 99%   BMI 18.90 kg/m?  ?General:   Pleasant, well developed female in no acute distress ?Head:  Normocephalic and atraumatic. ?Eyes: sclerae anicteric,conjunctive pink  ?Heart:  regular rate and rhythm ?Pulm: Clear anteriorly; no wheezing ?Abdomen:  Soft, Flat AB, skin exam normal, Sluggish bowel sounds. mild tenderness in the LLQ and in the left flank. Without guarding and Without rebound, without hepatomegaly. ?Extremities:  Without edema. ?Msk:  Symmetrical without gross deformities. Peripheral pulses intact.  ?Neurologic:  Alert and  oriented x4;  grossly normal neurologically. ?Skin:   Dry and intact without significant lesions or rashes. ?Psychiatric: Demonstrates good judgement and reason without abnormal affect or behaviors. ? ? ?Doree Albee, PA-C ?3:38 PM ? ? ?

## 2022-01-08 ENCOUNTER — Ambulatory Visit: Payer: No Typology Code available for payment source | Admitting: Internal Medicine

## 2022-01-09 ENCOUNTER — Ambulatory Visit (INDEPENDENT_AMBULATORY_CARE_PROVIDER_SITE_OTHER): Payer: No Typology Code available for payment source | Admitting: Physician Assistant

## 2022-01-09 ENCOUNTER — Encounter: Payer: Self-pay | Admitting: Physician Assistant

## 2022-01-09 ENCOUNTER — Other Ambulatory Visit (INDEPENDENT_AMBULATORY_CARE_PROVIDER_SITE_OTHER): Payer: No Typology Code available for payment source

## 2022-01-09 VITALS — BP 110/74 | HR 60 | Ht 60.0 in | Wt 96.8 lb

## 2022-01-09 DIAGNOSIS — K5900 Constipation, unspecified: Secondary | ICD-10-CM | POA: Diagnosis not present

## 2022-01-09 DIAGNOSIS — N12 Tubulo-interstitial nephritis, not specified as acute or chronic: Secondary | ICD-10-CM

## 2022-01-09 DIAGNOSIS — K219 Gastro-esophageal reflux disease without esophagitis: Secondary | ICD-10-CM

## 2022-01-09 DIAGNOSIS — K648 Other hemorrhoids: Secondary | ICD-10-CM | POA: Diagnosis not present

## 2022-01-09 LAB — COMPREHENSIVE METABOLIC PANEL
ALT: 8 U/L (ref 0–35)
AST: 13 U/L (ref 0–37)
Albumin: 4.3 g/dL (ref 3.5–5.2)
Alkaline Phosphatase: 49 U/L (ref 39–117)
BUN: 13 mg/dL (ref 6–23)
CO2: 26 mEq/L (ref 19–32)
Calcium: 9.6 mg/dL (ref 8.4–10.5)
Chloride: 103 mEq/L (ref 96–112)
Creatinine, Ser: 0.71 mg/dL (ref 0.40–1.20)
GFR: 120.73 mL/min (ref >60.00)
Glucose, Bld: 84 mg/dL (ref 70–99)
Potassium: 3.6 mEq/L (ref 3.5–5.1)
Sodium: 137 mEq/L (ref 135–145)
Total Bilirubin: 0.5 mg/dL (ref 0.2–1.2)
Total Protein: 7.1 g/dL (ref 6.0–8.3)

## 2022-01-09 LAB — CBC WITH DIFFERENTIAL/PLATELET
Basophils Absolute: 0 10*3/uL (ref 0.0–0.1)
Basophils Relative: 0.6 % (ref 0.0–3.0)
Eosinophils Absolute: 0.1 10*3/uL (ref 0.0–0.7)
Eosinophils Relative: 1.4 % (ref 0.0–5.0)
HCT: 38.9 % (ref 36.0–46.0)
Hemoglobin: 13.7 g/dL (ref 12.0–15.0)
Lymphocytes Relative: 22.9 % (ref 12.0–46.0)
Lymphs Abs: 1.1 10*3/uL (ref 0.7–4.0)
MCHC: 35.1 g/dL (ref 30.0–36.0)
MCV: 82.1 fl (ref 78.0–100.0)
Monocytes Absolute: 0.3 10*3/uL (ref 0.1–1.0)
Monocytes Relative: 5.3 % (ref 3.0–12.0)
Neutro Abs: 3.5 10*3/uL (ref 1.4–7.7)
Neutrophils Relative %: 69.8 % (ref 43.0–77.0)
Platelets: 253 10*3/uL (ref 150.0–400.0)
RBC: 4.74 Mil/uL (ref 3.87–5.11)
RDW: 13.5 % (ref 11.5–15.5)
WBC: 5 10*3/uL (ref 4.0–10.5)

## 2022-01-09 LAB — C-REACTIVE PROTEIN: CRP: <1 mg/dL (ref 0.5–20.0)

## 2022-01-09 LAB — SEDIMENTATION RATE: Sed Rate: 2 mm/hr (ref 0–20)

## 2022-01-09 LAB — TSH: TSH: 1.16 u[IU]/mL (ref 0.35–5.50)

## 2022-01-09 MED ORDER — HYDROCORTISONE (PERIANAL) 2.5 % EX CREA: 1.0000 "application " | TOPICAL_CREAM | Freq: Two times a day (BID) | CUTANEOUS | 1 refills | Status: DC | PRN

## 2022-01-09 NOTE — Patient Instructions (Addendum)
? ?Linzess  ?Take at least 30 minutes before the first meal of the day on an empty stomach ?You can have a loose stool if you eat a high-fat breakfast. ?Give it at least 4 days, may have more bowel movements during that time.  ?If you continue to have severe diarrhea try every other day, if you get AB pain with it stop.  ?After you are out we can send in a prescription if you did well, there is a prescription card ?Will start with 72 mcg once daily, can increase to the ? ?Can do trial of restora probiotic.  ? ?Apply a pea size amount of over the counter Anusol HC cream to the tip of an over the counter PrepH suppository and insert rectally once every night for at least 7 nights.  ? ?Please keep follow up on 02/28/22 at 3:00pm with Dr. Adela Lank  ? ?Recommend starting on a fiber supplement, can try metamucil first but if this causes gas/bloating switch to benefiber ?Take with fiber with with a full 8 oz glass of water once a day. This can take 1 month to start helping, so try for at least one month.  ?Recommend increasing water and activity.  ?Get a squatty potty to use at home or try a stool, goal is to get your knees above your hips during a bowel movement. ? ?- Drink at least 64-80 ounces of water/liquid per day. ?- Establish a time to try to move your bowels every day.  For many people, this is after a cup of coffee or after a meal such as breakfast. ?- Sit all of the way back on the toilet keeping your back fairly straight and while sitting up, try to rest the tops of your forearms on your upper thighs.   ?- Raising your feet with a step stool/squatty potty can be helpful to improve the angle that allows your stool to pass through the rectum. ?- Relax the rectum feeling it bulge toward the toilet water.  If you feel your rectum raising toward your body, you are contracting rather than relaxing. ?- Breathe in and slowly exhale. "Belly breath" by expanding your belly towards your belly button. Keep belly  expanded as you gently direct pressure down and back to the anus.  A low pitched GRRR sound can assist with increasing intra-abdominal pressure.  ?- Repeat 3-4 times. If unsuccessful, contract the pelvic floor to restore normal tone and get off the toilet.  Avoid excessive straining. ?- To reduce excessive wiping by teaching your anus to normally contract, place hands on outer aspect of knees and resist knee movement outward.  Hold 5-10 second then place hands just inside of knees and resist inward movement of knees.  Hold 5 seconds.  Repeat a few times each way. ? ?Go to the ER if unable to pass gas, severe AB pain, unable to hold down food, any shortness of breath of chest pain.  ? ?Constipation, Adult ?Constipation is when a person has fewer than three bowel movements in a week, has difficulty having a bowel movement, or has stools (feces) that are dry, hard, or larger than normal. Constipation may be caused by an underlying condition. It may become worse with age if a person takes certain medicines and does not take in enough fluids. ?Follow these instructions at home: ?Eating and drinking ? ?Eat foods that have a lot of fiber, such as beans, whole grains, and fresh fruits and vegetables. ?Limit foods that are low in  fiber and high in fat and processed sugars, such as fried or sweet foods. These include french fries, hamburgers, cookies, candies, and soda. ?Drink enough fluid to keep your urine pale yellow. ?General instructions ?Exercise regularly or as told by your health care provider. Try to do 150 minutes of moderate exercise each week. ?Use the bathroom when you have the urge to go. Do not hold it in. ?Take over-the-counter and prescription medicines only as told by your health care provider. This includes any fiber supplements. ?During bowel movements: ?Practice deep breathing while relaxing the lower abdomen. ?Practice pelvic floor relaxation. ?Watch your condition for any changes. Let your health care  provider know about them. ?Keep all follow-up visits as told by your health care provider. This is important. ?Contact a health care provider if: ?You have pain that gets worse. ?You have a fever. ?You do not have a bowel movement after 4 days. ?You vomit. ?You are not hungry or you lose weight. ?You are bleeding from the opening between the buttocks (anus). ?You have thin, pencil-like stools. ?Get help right away if: ?You have a fever and your symptoms suddenly get worse. ?You leak stool or have blood in your stool. ?Your abdomen is bloated. ?You have severe pain in your abdomen. ?You feel dizzy or you faint. ?Summary ?Constipation is when a person has fewer than three bowel movements in a week, has difficulty having a bowel movement, or has stools (feces) that are dry, hard, or larger than normal. ?Eat foods that have a lot of fiber, such as beans, whole grains, and fresh fruits and vegetables. ?Drink enough fluid to keep your urine pale yellow. ?Take over-the-counter and prescription medicines only as told by your health care provider. This includes any fiber supplements. ?This information is not intended to replace advice given to you by your health care provider. Make sure you discuss any questions you have with your health care provider. ?Document Revised: 08/26/2019 Document Reviewed: 08/26/2019 ?Elsevier Patient Education ? 2022 [employer] ? ? ?Can try FDGARD over the counter ?Avoid spicy and acidic foods ?Avoid fatty foods ?Limit your intake of coffee, tea, alcohol, and carbonated drinks ?Work to maintain a healthy weight ?Keep the head of the bed elevated at least 3 inches with blocks or a wedge pillow if you are having any nighttime symptoms ?Stay upright for 2 hours after eating ?Avoid meals and snacks three to four hours before bedtime ? ?Gastroesophageal Reflux Disease, Adult ?Gastroesophageal reflux (GER) happens when acid from the stomach flows up into the tube that connects the mouth and the  stomach (esophagus). Normally, food travels down the esophagus and stays in the stomach to be digested. However, when a person has GER, food and stomach acid sometimes move back up into the esophagus. If this becomes a more serious problem, the person may be diagnosed with a disease called gastroesophageal reflux disease (GERD). GERD occurs when the reflux: ?Happens often. ?Causes frequent or severe symptoms. ?Causes problems such as damage to the esophagus. ?When stomach acid comes in contact with the esophagus, the acid may cause inflammation in the esophagus. Over time, GERD may create small holes (ulcers) in the lining of the esophagus. ?What are the causes? ?This condition is caused by a problem with the muscle between the esophagus and the stomach (lower esophageal sphincter, or LES). Normally, the LES muscle closes after food passes through the esophagus to the stomach. When the LES is weakened or abnormal, it does not close properly, and that allows  food and stomach acid to go back up into the esophagus. ?The LES can be weakened by certain dietary substances, medicines, and medical conditions, including: ?Tobacco use. ?Pregnancy. ?Having a hiatal hernia. ?Alcohol use. ?Certain foods and beverages, such as coffee, chocolate, onions, and peppermint. ?What increases the risk? ?You are more likely to develop this condition if you: ?Have an increased body weight. ?Have a connective tissue disorder. ?Take NSAIDs, such as ibuprofen. ?What are the signs or symptoms? ?Symptoms of this condition include: ?Heartburn. ?Difficult or painful swallowing and the feeling of having a lump in the throat. ?A bitter taste in the mouth. ?Bad breath and having a large amount of saliva. ?Having an upset or bloated stomach and belching. ?Chest pain. Different conditions can cause chest pain. Make sure you see your health care provider if you experience chest pain. ?Shortness of breath or wheezing. ?Ongoing (chronic) cough or a  nighttime cough. ?Wearing away of tooth enamel. ?Weight loss. ?How is this diagnosed? ?This condition may be diagnosed based on a medical history and a physical exam. To determine if you have mild or severe GERD

## 2022-01-10 LAB — H. PYLORI ANTIBODY, IGG: H Pylori IgG: NEGATIVE

## 2022-01-16 ENCOUNTER — Encounter: Payer: Self-pay | Admitting: Gastroenterology

## 2022-01-31 ENCOUNTER — Encounter (HOSPITAL_COMMUNITY): Payer: Self-pay

## 2022-01-31 ENCOUNTER — Ambulatory Visit (HOSPITAL_COMMUNITY)
Admission: RE | Admit: 2022-01-31 | Discharge: 2022-01-31 | Disposition: A | Discharge status: Home / Self Care | Payer: No Typology Code available for payment source | Source: Ambulatory Visit | Attending: Internal Medicine | Admitting: Internal Medicine

## 2022-01-31 ENCOUNTER — Other Ambulatory Visit: Payer: Self-pay

## 2022-01-31 VITALS — BP 104/78 | HR 94 | Temp 98.3°F | Resp 16 | Ht 60.0 in | Wt 96.8 lb

## 2022-01-31 DIAGNOSIS — M545 Low back pain, unspecified: Secondary | ICD-10-CM | POA: Insufficient documentation

## 2022-01-31 DIAGNOSIS — R319 Hematuria, unspecified: Secondary | ICD-10-CM | POA: Insufficient documentation

## 2022-01-31 LAB — POCT URINALYSIS DIPSTICK, ED / UC
Bilirubin Urine: NEGATIVE
Glucose, UA: NEGATIVE mg/dL
Ketones, ur: NEGATIVE mg/dL
Leukocytes,Ua: NEGATIVE
Nitrite: NEGATIVE
Protein, ur: NEGATIVE mg/dL
Specific Gravity, Urine: 1.02 (ref 1.005–1.030)
Urobilinogen, UA: 0.2 mg/dL (ref 0.0–1.0)
pH: 5.5 (ref 5.0–8.0)

## 2022-01-31 LAB — POC URINE PREG, ED: Preg Test, Ur: NEGATIVE

## 2022-01-31 NOTE — Discharge Instructions (Signed)
Your urine did not shows clear signs of infection.  It looks like there is some blood in your urine, which can be from your period or could be from kidney stones.  Take ibuprofen 800mg  every 8 hrs as needed for your pain, use a warm heating pad on your back, try some gentle stretching, and stay well hydrated with water.  If you pain severely worsens, you develop fever, vomiting, or difficulty urinating, numbness and tingling in your groin, you should be seen at the emergency room right away.   ? ?

## 2022-01-31 NOTE — ED Provider Notes (Signed)
?[facility] ? ? ? ?CSN: [id number] ?Arrival date & time: 01/31/22  1023 ? ? ?  ? ?History   ?Chief Complaint ?Chief Complaint  ?Patient presents with  ? Back Pain  ?  Bad back pain and thigh pain - Entered by patient  ? thigh pain  ? ? ?HPI ?Sandra Vance is a 21 y.o. female.  ? ?Right Sided back pain ?Started with some right sided thigh pain a few days ago ?Now having back pain ?Pain worsens with urination ?Some suprapubic pain as well ?No hematuria, vaginal discharge ?No new sexual partners ?On OCPs, denies change of pregnancy ?Reports that she just finished her period ?No injury ?No fevers or vomiting ?No numbness and tingling, saddle anesthesias, changes Bowel habits, difficulty urinating ? ?Previously admitted for pyelonephritis in August 2022 ?At that time had pan sensitive E. Coli ?Feels like this feels similar, but not as severe ? ? ?Past Medical History:  ?Diagnosis Date  ? Asthma   ? ? ?Patient Active Problem List  ? Diagnosis Date Noted  ? Pyelonephritis 06/08/2021  ? Asthma in adult, mild intermittent, uncomplicated 06/08/2021  ? Positive ANA (antinuclear antibody) 04/07/2021  ? Arthralgia 04/07/2021  ? Weight loss 04/07/2021  ? Lymphadenopathy of head and neck 04/07/2021  ? Rash 04/07/2021  ? Urinary frequency 04/07/2021  ? ? ?History reviewed. No pertinent surgical history. ? ?OB History   ?No obstetric history on file. ?  ? ? ? ?Home Medications   ? ?Prior to Admission medications   ?Medication Sig Start Date End Date Taking? Authorizing Provider  ?hydrocortisone (ANUSOL-HC) 2.5 % rectal cream Place 1 application. rectally 2 (two) times daily as needed for hemorrhoids. 01/09/22   Doree Albee, PA-C  ?norgestimate-ethinyl estradiol (ORTHO-CYCLEN) 0.25-35 MG-MCG tablet Take 1 tablet by mouth daily.    [provider]  ? ? ?Family History ?Family History  ?Problem Relation Age of Onset  ? Lupus Mother   ? Asthma Mother   ? Irritable bowel syndrome Mother   ? Healthy Father   ?  Autoimmune disease Sister   ? Asthma Sister   ? ADD / ADHD Sister   ? ADD / ADHD Sister   ? Asthma Sister   ? Asthma Brother   ? Sickle cell anemia Maternal Grandmother   ? Diabetes Maternal Grandfather   ? Lupus Paternal Aunt   ? Fibromyalgia Paternal Aunt   ? Breast cancer Maternal Great-grandmother   ? Prostate cancer Paternal Grandfather   ? Colon cancer Maternal Great-grandfather   ? Diabetes Maternal Aunt   ? Esophageal cancer Neg Hx   ? Stomach cancer Neg Hx   ? Pancreatic cancer Neg Hx   ? Colon polyps Neg Hx   ? ? ?Social History ?Social History  ? ?Tobacco Use  ? Smoking status: Never  ? Smokeless tobacco: Never  ?Vaping Use  ? Vaping Use: Former  ?Substance Use Topics  ? Alcohol use: Yes  ?  Comment: occasionally  ? Drug use: No  ? ? ? ?Allergies   ?Shellfish-derived products and Raspberry ? ? ?Review of Systems ?Review of Systems  ?All other systems reviewed and are negative. ? ?Per HPI ?Physical Exam ?Triage Vital Signs ?ED Triage Vitals  ?Enc Vitals Group  ?   BP 01/31/22 1055 104/78  ?   Pulse Rate 01/31/22 1055 94  ?   Resp 01/31/22 1055 16  ?   Temp 01/31/22 1055 98.3 ?F (36.8 ?C)  ?   Temp Source  01/31/22 1055 Oral  ?   SpO2 01/31/22 1055 100 %  ?   Weight 01/31/22 1054 96 lb 12.5 oz (43.9 kg)  ?   Height 01/31/22 1054 5' (1.524 m)  ?   Head Circumference --   ?   Peak Flow --   ?   Pain Score 01/31/22 1054 7  ?   Pain Loc --   ?   Pain Edu? --   ?   Excl. in GC? --   ? ?No data found. ? ?Updated Vital Signs ?BP 104/78 (BP Location: Left Arm)   Pulse 94   Temp 98.3 ?F (36.8 ?C) (Oral)   Resp 16   Ht 5' (1.524 m)   Wt 96 lb 12.5 oz (43.9 kg)   SpO2 100%   BMI 18.90 kg/m?  ? ?Visual Acuity ?Right Eye Distance:   ?Left Eye Distance:   ?Bilateral Distance:   ? ?Right Eye Near:   ?Left Eye Near:    ?Bilateral Near:    ? ?Physical Exam ?Constitutional:   ?   General: She is not in acute distress. ?   Appearance: Normal appearance. She is not ill-appearing.  ?HENT:  ?   Head: Normocephalic and  atraumatic.  ?Eyes:  ?   Conjunctiva/sclera: Conjunctivae normal.  ?Cardiovascular:  ?   Rate and Rhythm: Normal rate.  ?Pulmonary:  ?   Effort: Pulmonary effort is normal. No respiratory distress.  ?Abdominal:  ?   Palpations: Abdomen is soft.  ?   Tenderness: There is abdominal tenderness (mild suprapubic). There is right CVA tenderness (mild). There is no left CVA tenderness, guarding or rebound.  ?Musculoskeletal:  ?   Cervical back: Normal range of motion.  ?   Comments: Lumbar spine: ?- Inspection: no gross deformity or asymmetry, swelling or ecchymosis ?- Palpation: Some TTP of paraspinal muscles on right No TTP over the spinous processes, or SI joints b/l ?- ROM: full active ROM of the lumbar spine in flexion and extension without pain  ?- Strength: 5/5 strength of lower extremity in L4-S1 nerve root distributions b/l; normal gait ?- Neuro: sensation intact in the L4-S1 nerve root distribution b/l ?- Special testing: negative slump ? ?  ?Skin: ?   General: Skin is warm and dry.  ?Neurological:  ?   Mental Status: She is alert and oriented to person, place, and time.  ?Psychiatric:     ?   Mood and Affect: Mood normal.     ?   Behavior: Behavior normal.  ? ? ? ?UC Treatments / Results  ?Labs ?(all labs ordered are listed, but only abnormal results are displayed) ?Labs Reviewed  ?POCT URINALYSIS DIPSTICK, ED / UC - Abnormal; Notable for the following components:  ?    Result Value  ? Hgb urine dipstick MODERATE (*)   ? All other components within normal limits  ?URINE CULTURE  ?POC URINE PREG, ED  ? ? ?EKG ? ? ?Radiology ?No results found. ? ?Procedures ?Procedures (including critical care time) ? ?Medications Ordered in UC ?Medications - No data to display ? ?Initial Impression / Assessment and Plan / UC Course  ?I have reviewed the triage vital signs and the nursing notes. ? ?Pertinent labs & imaging results that were available during my care of the patient were reviewed by me and considered in my medical  decision making (see chart for details). ? ?  ? ?Moderate hematuria on dipstick, no other signs of infection.  Could consider renal stone, could  also be from recent menses.  Recommend anti-inflammatory medication and increasing fluid intake.  Reassured that she is still able to urinate.  Will send confirmatory urine culture and treat based off results.  Musculoskeletal back pain is also a possibility, recommend anti-inflammatories and heat for this as well.  Given ED precautions, see AVS. ? ?  ?Final Clinical Impressions(s) / UC Diagnoses  ? ?Final diagnoses:  ?Acute right-sided low back pain without sciatica  ?Hematuria, unspecified type  ? ? ? ?Discharge Instructions   ? ?  ?Your urine did not shows clear signs of infection.  It looks like there is some blood in your urine, which can be from your period or could be from kidney stones.  Take ibuprofen 800mg  every 8 hrs as needed for your pain, use a warm heating pad on your back, try some gentle stretching, and stay well hydrated with water.  If you pain severely worsens, you develop fever, vomiting, or difficulty urinating, numbness and tingling in your groin, you should be seen at the emergency room right away.   ? ? ? ? ? ?ED Prescriptions   ?None ?  ? ?PDMP not reviewed this encounter. ?  ?Unknown JimMeccariello, Kinzi Frediani J, DO ?01/31/22 1118 ? ?

## 2022-01-31 NOTE — ED Triage Notes (Signed)
Pt reports right side back pain and right thigh pain x 3 days. States pain also increases with urination. ?

## 2022-02-01 LAB — URINE CULTURE

## 2022-02-06 NOTE — Progress Notes (Signed)
? ?Office Visit Note ? ?Patient: Sandra Vance             ?Date of Birth: 04-05-2001           ?MRN: [id number]             ?PCP: Dorna Mai, MD ?Referring: Dorna Mai, MD ?Visit Date: 02/07/2022 ? ? ?Subjective:  ? ?History of Present Illness: Sandra Vance is a 21 y.o. female here for follow up for positive ANA associated with joint pains, rashes, lymphadenopathy, and fatigue. Findings were nonspecific at initial visit. She continues with symptoms of intermittent hand swelling and stiffness. Her feet also with swelling and discoloration intermittently. Skin rashes on the face and neck with some redness and some hyperpigmentation on the neck area. She feels itching and skin irritation with sun exposure but no visible rashes. ?More recently she went to the ED after developing severe back and flank pain found to have pyelonephritis treated with antibiotics and symptoms resolved. Subsequently in January developed severe diarrhea and visited the ED treated with antiemetics and pain controlled improved without specific cause identified. Since that time she has persistent constipation. 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 ? ?Previous HPI ?04/07/21 ?Sandra Vance is a 21 y.o. female here for evaluation of positive ANA and symptoms with fatigue, arthralgia, swelling, GI symptoms with weight loss, urinary frequency, and alopecia. She was previously evaluated by pediatric rheumatology without active autoimmune disease identified associated with significantly positive ANA titer. Her joint pains are chronic since years ago but has noticed more recently some worsening with pain particularly in bilateral hands, knees, and feet. She notices pain and sometimes swelling more often in her right thumb and in the bilateral feet extending up to the ankles. She often notices this after a day of working. She takes ibuprofen sometimes as needed with a benefit. She reports skin rash on the back  of her neck with an area of erythema and hair thinning identified in PCP office as eczema. She also notices intermittent rash on her arms bilaterally she describes as small red spots lasting for up to several days. These seem to be sensitive to heat and do not leave residual discoloration. She has chronic GI problems with early satiety, nausea and vomiting with eating full meals, inability to gain any weight. This was evaluated with gastroenterology years ago without any specific diagnosis or treatment apparently. Urinary frequency is ongoing since around April. She reports having to urinate about 5 times at night with small amounts of volume. She has had microscopic hematuria on urine samples and suspicion for kidney stones, with some flank pain previously but this pain improved without change in hematuria and frequency.  ?  ?Labs reviewed ?ANA 1:80 speckled ?RF neg ?CCP neg ?  ?02/2016 ?ANA 1:640 speckled ?SSA neg ?SSB neg ?dsDNA neg ?Smith neg ?Complements C3, C4 wnl ? ? ?Review of Systems  ?Constitutional:  Positive for fatigue.  ?HENT:  Positive for mouth sores. Negative for mouth dryness and nose dryness.   ?Eyes:  Positive for itching and dryness. Negative for pain.  ?Respiratory:  Negative for shortness of breath and difficulty breathing.   ?Cardiovascular:  Negative for chest pain and palpitations.  ?Gastrointestinal:  Positive for abdominal pain, blood in stool, constipation, diarrhea and nausea.  ?Endocrine: Negative for increased urination.  ?Genitourinary:  Negative for difficulty urinating.  ?Musculoskeletal:  Positive for joint pain, joint pain, joint swelling, myalgias, morning stiffness, muscle tenderness  and myalgias.  ?Skin:  Positive for color change, rash and sensitivity to sunlight.  ?Allergic/Immunologic: Negative for susceptible to infections.  ?Neurological:  Positive for numbness and weakness. Negative for dizziness, headaches and memory loss.  ?Hematological:  Positive for bruising/bleeding  tendency.  ?Psychiatric/Behavioral:  Negative for confusion.   ? ?PMFS History:  ?Patient Active Problem List  ? Diagnosis Date Noted  ? Upper back pain 02/07/2022  ? Pyelonephritis 06/08/2021  ? Asthma in adult, mild intermittent, uncomplicated [id number]  ? Positive ANA (antinuclear antibody) 04/07/2021  ? Arthralgia 04/07/2021  ? Weight loss 04/07/2021  ? Lymphadenopathy of head and neck 04/07/2021  ? Rash 04/07/2021  ? Urinary frequency 04/07/2021  ?  ?Past Medical History:  ?Diagnosis Date  ? Asthma   ?  ?Family History  ?Problem Relation Age of Onset  ? Lupus Mother   ? Asthma Mother   ? Irritable bowel syndrome Mother   ? Healthy Father   ? Autoimmune disease Sister   ? Asthma Sister   ? ADD / ADHD Sister   ? ADD / ADHD Sister   ? Asthma Sister   ? Asthma Brother   ? Sickle cell anemia Maternal Grandmother   ? Diabetes Maternal Grandfather   ? Lupus Paternal Aunt   ? Fibromyalgia Paternal Aunt   ? Breast cancer Maternal Great-grandmother   ? Prostate cancer Paternal Grandfather   ? Colon cancer Maternal Great-grandfather   ? Diabetes Maternal Aunt   ? Esophageal cancer Neg Hx   ? Stomach cancer Neg Hx   ? Pancreatic cancer Neg Hx   ? Colon polyps Neg Hx   ? ?History reviewed. No pertinent surgical history. ?Social History  ? ?Social History Narrative  ? Not on file  ? ?Immunization History  ?Administered Date(s) Administered  ? Influenza,inj,Quad PF,6-35 Mos 09/18/2021  ? Tdap 10/26/2020  ?  ? ?Objective: ?Vital Signs: BP 117/81 (BP Location: Left Arm, Patient Position: Sitting, Cuff Size: Normal)   Pulse 88   Ht 4\' 11"  (1.499 m)   Wt 99 lb 3.2 oz (45 kg)   BMI 20.04 kg/m?   ? ?Physical Exam ?HENT:  ?   Right Ear: External ear normal.  ?   Left Ear: External ear normal.  ?Eyes:  ?   Conjunctiva/sclera: Conjunctivae normal.  ?Cardiovascular:  ?   Rate and Rhythm: Normal rate and regular rhythm.  ?Pulmonary:  ?   Effort: Pulmonary effort is normal.  ?   Breath sounds: Normal breath sounds.   ?Musculoskeletal:  ?   Right lower leg: No edema.  ?   Left lower leg: No edema.  ?Lymphadenopathy:  ?   Cervical: No cervical adenopathy.  ?Skin: ?   General: Skin is warm and dry.  ?   Comments: Flat hyperpigmented patch on back of neck  ?Neurological:  ?   Mental Status: She is alert.  ?Psychiatric:     ?   Mood and Affect: Mood normal.  ?  ? ?Musculoskeletal Exam:  ?Neck full ROM no tenderness ?Shoulders full ROM no tenderness or swelling ?Elbows full ROM no tenderness or swelling ?Wrists full ROM no tenderness or swelling ?Fingers full ROM, tenderness to pressure on several PIP joints without palpable synovitis, mild and reducible hyperextension deformities in several fingers ?Knees full ROM no tenderness or swelling ?Ankles full ROM no tenderness or swelling ? ? ?Investigation: ?No additional findings. ? ?Imaging: ?No results found. ? ?Recent Labs: ?Lab Results  ?Component Value Date  ? WBC 5.0 01/09/2022  ?  HGB 13.7 01/09/2022  ? PLT 253.0 01/09/2022  ? NA 137 01/09/2022  ? K 3.6 01/09/2022  ? CL 103 01/09/2022  ? CO2 26 01/09/2022  ? GLUCOSE 84 01/09/2022  ? BUN 13 01/09/2022  ? CREATININE 0.71 01/09/2022  ? BILITOT 0.5 01/09/2022  ? ALKPHOS 49 01/09/2022  ? AST 13 01/09/2022  ? ALT 8 01/09/2022  ? PROT 7.1 01/09/2022  ? ALBUMIN 4.3 01/09/2022  ? CALCIUM 9.6 01/09/2022  ? GFRAA NOT CALCULATED 05/13/2017  ? ? ?Speciality Comments: No specialty comments available. ? ?Procedures:  ?No procedures performed ?Allergies: Shellfish-derived products and Raspberry  ? ?Assessment / Plan:     ?Visit Diagnoses: Positive ANA (antinuclear antibody) ?Rash ?Lymphadenopathy of head and neck ?Arthralgia, unspecified joint - Plan: hydroxychloroquine (PLAQUENIL) 200 MG tablet, Anti-DNA antibody, double-stranded, C3 and C4, ANA, Anti-Smith antibody ? ?Constellation of symptoms with positive ANA and lupus family history suggestive for a connective tissue disease. Does not meet clinical and serologic criteria of SLE. Recommend  trial of HCQ 200 mg daily for possible UCTD. F/U in 8wks for response to treatment. Repeat ANA, dsDNA, smith, C3 and C4 Abs. Discussed UV protection with SPF 50 or better product or sleeves. Recommend starting vitamin D su

## 2022-02-07 ENCOUNTER — Ambulatory Visit (INDEPENDENT_AMBULATORY_CARE_PROVIDER_SITE_OTHER): Payer: No Typology Code available for payment source | Admitting: Internal Medicine

## 2022-02-07 ENCOUNTER — Encounter: Payer: Self-pay | Admitting: Internal Medicine

## 2022-02-07 VITALS — BP 117/81 | HR 88 | Ht 59.0 in | Wt 99.2 lb

## 2022-02-07 DIAGNOSIS — R768 Other specified abnormal immunological findings in serum: Secondary | ICD-10-CM

## 2022-02-07 DIAGNOSIS — R591 Generalized enlarged lymph nodes: Secondary | ICD-10-CM | POA: Diagnosis not present

## 2022-02-07 DIAGNOSIS — R21 Rash and other nonspecific skin eruption: Secondary | ICD-10-CM | POA: Diagnosis not present

## 2022-02-07 DIAGNOSIS — M2550 Pain in unspecified joint: Secondary | ICD-10-CM | POA: Diagnosis not present

## 2022-02-07 DIAGNOSIS — Z79899 Other long term (current) drug therapy: Secondary | ICD-10-CM

## 2022-02-07 DIAGNOSIS — M549 Dorsalgia, unspecified: Secondary | ICD-10-CM | POA: Insufficient documentation

## 2022-02-07 MED ORDER — HYDROXYCHLOROQUINE SULFATE 200 MG PO TABS: 200.0000 mg | ORAL_TABLET | Freq: Every day | ORAL | 2 refills | Status: AC

## 2022-02-07 MED ORDER — CYCLOBENZAPRINE HCL 5 MG PO TABS: 5.0000 mg | ORAL_TABLET | Freq: Every evening | ORAL | 0 refills | Status: DC | PRN

## 2022-02-07 NOTE — Patient Instructions (Signed)
Hydroxychloroquine Tablets ?What is this medication? ?HYDROXYCHLOROQUINE (hye drox ee KLOR oh kwin) treats autoimmune conditions, such as rheumatoid arthritis and lupus. It works by slowing down an overactive immune system. It may also be used to prevent and treat malaria. It works by killing the parasite that causes malaria. It belongs to a group of medications called DMARDs. ?This medicine may be used for other purposes; ask your health care provider or pharmacist if you have questions. ?COMMON BRAND NAME(S): Plaquenil, Quineprox ?What should I tell my care team before I take this medication? ?They need to know if you have any of these conditions: ?Diabetes ?Eye disease, vision problems ?G6PD deficiency ?Heart disease ?History of irregular heartbeat ?If you often drink alcohol ?Kidney disease ?Liver disease ?Porphyria ?Psoriasis ?An unusual or allergic reaction to chloroquine, hydroxychloroquine, other medications, foods, dyes, or preservatives ?Pregnant or trying to get pregnant ?Breast-feeding ?How should I use this medication? ?Take this medication by mouth with a glass of water. Take it as directed on the prescription label. Do not cut, crush or chew this medication. Swallow the tablets whole. Take it with food. Do not take it more than directed. Take all of this medication unless your care team tells you to stop it early. Keep taking it even if you think you are better. ?Take products with antacids in them at a different time of day than this medication. Take this medication 4 hours before or 4 hours after antacids. Talk to your care team if you have questions. ?Talk to your care team about the use of this medication in children. While this medication may be prescribed for selected conditions, precautions do apply. ?Overdosage: If you think you have taken too much of this medicine contact a poison control center or emergency room at once. ?NOTE: This medicine is only for you. Do not share this medicine with  others. ?What if I miss a dose? ?If you miss a dose, take it as soon as you can. If it is almost time for your next dose, take only that dose. Do not take double or extra doses. ?What may interact with this medication? ?Do not take this medication with any of the following: ?Cisapride ?Dronedarone ?Pimozide ?Thioridazine ?This medication may also interact with the following: ?Ampicillin ?Antacids ?Cimetidine ?Cyclosporine ?Digoxin ?Kaolin ?Medications for diabetes, like insulin, glipizide, glyburide ?Medications for seizures like carbamazepine, phenobarbital, phenytoin ?Mefloquine ?Methotrexate ?Other medications that prolong the QT interval (cause an abnormal heart rhythm) ?Praziquantel ?This list may not describe all possible interactions. Give your health care provider a list of all the medicines, herbs, non-prescription drugs, or dietary supplements you use. Also tell them if you smoke, drink alcohol, or use illegal drugs. Some items may interact with your medicine. ?What should I watch for while using this medication? ?Visit your care team for regular checks on your progress. Tell your care team if your symptoms do not start to get better or if they get worse. ?You may need blood work done while you are taking this medication. If you take other medications that can affect heart rhythm, you may need more testing. Talk to your care team if you have questions. ?Your vision may be tested before and during use of this medication. Tell your care team right away if you have any change in your eyesight. ?This medication may cause serious skin reactions. They can happen weeks to months after starting the medication. Contact your care team right away if you notice fevers or flu-like symptoms with a rash. The   rash may be red or purple and then turn into blisters or peeling of the skin. Or, you might notice a red rash with swelling of the face, lips or lymph nodes in your neck or under your arms. ?If you or your family  notice any changes in your behavior, such as new or worsening depression, thoughts of harming yourself, anxiety, or other unusual or disturbing thoughts, or memory loss, call your care team right away. ?What side effects may I notice from receiving this medication? ?Side effects that you should report to your care team as soon as possible: ?Allergic reactions--skin rash, itching, hives, swelling of the face, lips, tongue, or throat ?Aplastic anemia--unusual weakness or fatigue, dizziness, headache, trouble breathing, increased bleeding or bruising ?Change in vision ?Heart rhythm changes--fast or irregular heartbeat, dizziness, feeling faint or lightheaded, chest pain, trouble breathing ?Infection--fever, chills, cough, or sore throat ?Low blood sugar (hypoglycemia)--tremors or shaking, anxiety, sweating, cold or clammy skin, confusion, dizziness, rapid heartbeat ?Muscle injury--unusual weakness or fatigue, muscle pain, dark yellow or brown urine, decrease in amount of urine ?Pain, tingling, or numbness in the hands or feet ?Rash, fever, and swollen lymph nodes ?Redness, blistering, peeling, or loosening of the skin, including inside the mouth ?Thoughts of suicide or self-harm, worsening mood, or feelings of depression ?Unusual bruising or bleeding ?Side effects that usually do not require medical attention (report to your care team if they continue or are bothersome): ?Diarrhea ?Headache ?Nausea ?Stomach pain ?Vomiting ?This list may not describe all possible side effects. Call your doctor for medical advice about side effects. You may report side effects to FDA at 1-800-FDA-1088. ?Where should I keep my medication? ?Keep out of the reach of children and pets. ?Store at room temperature up to 30 degrees C (86 degrees F). Protect from light. Get rid of any unused medication after the expiration date. ?To get rid of medications that are no longer needed or have expired: ?Take the medication to a medication take-back  program. Check with your pharmacy or law enforcement to find a location. ?If you cannot return the medication, check the label or package insert to see if the medication should be thrown out in the garbage or flushed down the toilet. If you are not sure, ask your care team. If it is safe to put it in the trash, empty the medication out of the container. Mix the medication with cat litter, dirt, coffee grounds, or other unwanted substance. Seal the mixture in a bag or container. Put it in the trash. ?NOTE: This sheet is a summary. It may not cover all possible information. If you have questions about this medicine, talk to your doctor, pharmacist, or health care provider. ?? 2023 [employer]vier/Gold Standard (2021-02-23 00:00:00) ? ?

## 2022-02-10 LAB — ANTI-DNA ANTIBODY, DOUBLE-STRANDED: ds DNA Ab: <1 IU/mL

## 2022-02-10 LAB — ANTI-NUCLEAR AB-TITER (ANA TITER): ANA Titer 1: 1:80 {titer} — ABNORMAL HIGH

## 2022-02-10 LAB — ANTI-SMITH ANTIBODY: ENA SM Ab Ser-aCnc: <1 AI

## 2022-02-10 LAB — C3 AND C4
C3 Complement: 138 mg/dL (ref 83–193)
C4 Complement: 30 mg/dL (ref 15–57)

## 2022-02-10 LAB — ANA: Anti Nuclear Antibody (ANA): POSITIVE — AB

## 2022-02-12 NOTE — Progress Notes (Signed)
Labs show low positive ANA still but other markers are all negative. We can follow up how she does with the hydroxychloroquine as planned.

## 2022-02-15 ENCOUNTER — Encounter: Payer: Self-pay | Admitting: *Deleted

## 2022-02-28 ENCOUNTER — Encounter: Payer: Self-pay | Admitting: Gastroenterology

## 2022-02-28 ENCOUNTER — Ambulatory Visit (INDEPENDENT_AMBULATORY_CARE_PROVIDER_SITE_OTHER): Payer: No Typology Code available for payment source | Admitting: Gastroenterology

## 2022-02-28 VITALS — BP 90/60 | HR 100 | Ht 60.0 in | Wt 99.8 lb

## 2022-02-28 DIAGNOSIS — K625 Hemorrhage of anus and rectum: Secondary | ICD-10-CM | POA: Diagnosis not present

## 2022-02-28 DIAGNOSIS — K5900 Constipation, unspecified: Secondary | ICD-10-CM | POA: Diagnosis not present

## 2022-02-28 DIAGNOSIS — K219 Gastro-esophageal reflux disease without esophagitis: Secondary | ICD-10-CM | POA: Diagnosis not present

## 2022-02-28 DIAGNOSIS — R1013 Epigastric pain: Secondary | ICD-10-CM | POA: Diagnosis not present

## 2022-02-28 DIAGNOSIS — R110 Nausea: Secondary | ICD-10-CM

## 2022-02-28 DIAGNOSIS — Z791 Long term (current) use of non-steroidal anti-inflammatories (NSAID): Secondary | ICD-10-CM

## 2022-02-28 MED ORDER — OMEPRAZOLE 40 MG PO CPDR: 40.0000 mg | DELAYED_RELEASE_CAPSULE | Freq: Every day | ORAL | 1 refills | Status: DC

## 2022-02-28 MED ORDER — PLENVU 140 G PO SOLR: 1.0000 | Freq: Once | ORAL | 0 refills | Status: AC

## 2022-02-28 MED ORDER — CALMOL-4 76-10 % RE SUPP: RECTAL | 0 refills | Status: DC

## 2022-02-28 MED ORDER — LINACLOTIDE 290 MCG PO CAPS: 290.0000 ug | ORAL_CAPSULE | Freq: Every day | ORAL | 0 refills | Status: DC

## 2022-02-28 MED ORDER — ONDANSETRON 4 MG PO TBDP: 4.0000 mg | ORAL_TABLET | Freq: Three times a day (TID) | ORAL | 1 refills | Status: DC | PRN

## 2022-02-28 NOTE — Progress Notes (Signed)
? ?HPI :  ?21 year old female here for follow-up visit for upper tract symptoms and constipation with rectal bleeding.  See full intake note with Quentin Mulling from March 21. ? ?She is here for follow-up visit accompanied by her mother today.  She endorses constipation for the past year or so, at baseline having a bowel movement once every 4 days which is hard to pass and she strains.  She started having blood in the stool as of February, this is overt blood noted coating the stool, occurs occasionally, volume can vary.  She was told its been hemorrhoids in the past.  She also describes some white mucus in the stool as well.  She has been using MiraLAX once daily and states that is not really helping.  She was given some samples of Linzess from her last office visit, she states she took a few of the pills, cannot remember which dose, did not seem to help much so she did not try it again.  She has been using some steroid cream for hemorrhoids which helped slightly but not significantly.  She denies any first-degree family history of colon cancer.  Her great grandfather had colon cancer.  Her symptoms of her constipation largely persist since her last visit. ? ?She otherwise has been experiencing a lot of indigestion and upset stomach since January.  She states she had a severe diarrheal illness in January and since then has not been feeling well.  Endorses a lot of pyrosis, feels nausea, with some postprandial epigastric pain.  She had an H. pylori IgG serology checked after her last visit and was negative.  CBC showed no anemia.  She does states she has been using Advil every day for back and stomach pain.  Occasionally she uses Goody powders as well.  Has not tried any antacid yet.  Not tried any antiemetics. ? ? ?06/08/2021 CT AB and pelvis with contrast-normal liver, no gallstones, no biliary dilatation, unremarkable pancreas, normal spleen, normal stomach, normal bowel without thickening or inflammatory  changes.  Positive for pyelonephritis. ?   ? ?External labs and notes reviewed this visit: ?CBC  11/07/2021  ?HGB 13.1 MCV 84.7 without evidence of anemia ?WBC 4.5 Platelets 161 ?Kidney function 11/07/2021  ?BUN 15 Cr 1.00  ?GFR >60  ?Potassium 3.4   ?LFTs 11/07/2021  ?AST 27 ALT 29 ?Alkphos 49 TBili 0.4 ?11/07/2021 LIPASE 23 ?  ?CT scan 11/07/21: ?IMPRESSION: ?1. No acute localizing process in the abdomen or pelvis. ? ? ? ?Past Medical History:  ?Diagnosis Date  ? Asthma   ? Constipation   ? ? ? ?History reviewed. No pertinent surgical history. ?Family History  ?Problem Relation Age of Onset  ? Lupus Mother   ? Asthma Mother   ? Irritable bowel syndrome Mother   ? Healthy Father   ? Autoimmune disease Sister   ? Asthma Sister   ? ADD / ADHD Sister   ? ADD / ADHD Sister   ? Asthma Sister   ? Asthma Brother   ? Sickle cell anemia Maternal Grandmother   ? Diabetes Maternal Grandfather   ? Lupus Paternal Aunt   ? Fibromyalgia Paternal Aunt   ? Breast cancer Maternal Great-grandmother   ? Prostate cancer Paternal Grandfather   ? Colon cancer Maternal Great-grandfather   ? Diabetes Maternal Aunt   ? Esophageal cancer Neg Hx   ? Stomach cancer Neg Hx   ? Pancreatic cancer Neg Hx   ? Colon polyps Neg Hx   ? ?  Social History  ? ?Tobacco Use  ? Smoking status: Never  ?  Passive exposure: Never  ? Smokeless tobacco: Never  ?Vaping Use  ? Vaping Use: Former  ?Substance Use Topics  ? Alcohol use: Yes  ?  Comment: occasionally  ? Drug use: No  ? ?Current Outpatient Medications  ?Medication Sig Dispense Refill  ? cyclobenzaprine (FLEXERIL) 5 MG tablet Take 1 tablet (5 mg total) by mouth at bedtime as needed for muscle spasms. 30 tablet 0  ? hydrocortisone (ANUSOL-HC) 2.5 % rectal cream Place 1 application. rectally 2 (two) times daily as needed for hemorrhoids. 30 g 1  ? hydroxychloroquine (PLAQUENIL) 200 MG tablet Take 1 tablet (200 mg total) by mouth daily. 30 tablet 2  ? norgestimate-ethinyl estradiol (ORTHO-CYCLEN) 0.25-35 MG-MCG  tablet Take 1 tablet by mouth daily.    ? polyethylene glycol (MIRALAX / GLYCOLAX) 17 g packet Take 17 g by mouth daily.    ? ?No current facility-administered medications for this visit.  ? ?Allergies  ?Allergen Reactions  ? Shellfish-Derived Products   ? Raspberry Itching and Rash  ? ? ? ?Review of Systems: ?All systems reviewed and negative except where noted in HPI.  ? ? ?Lab Results  ?Component Value Date  ? WBC 5.0 01/09/2022  ? HGB 13.7 01/09/2022  ? HCT 38.9 01/09/2022  ? MCV 82.1 01/09/2022  ? PLT 253.0 01/09/2022  ? ? ?Lab Results  ?Component Value Date  ? NA 137 01/09/2022  ? K 3.6 01/09/2022  ? CO2 26 01/09/2022  ? GLUCOSE 84 01/09/2022  ? BUN 13 01/09/2022  ? CREATININE 0.71 01/09/2022  ? CALCIUM 9.6 01/09/2022  ? GFRNONAA >60 11/07/2021  ? ?Lab Results  ?Component Value Date  ? ALT 8 01/09/2022  ? AST 13 01/09/2022  ? ALKPHOS 49 01/09/2022  ? BILITOT 0.5 01/09/2022  ? ? ? ? ?Physical Exam: ?BP 90/60   Pulse 100   Ht 5' (1.524 m)   Wt 99 lb 12.8 oz (45.3 kg)   BMI 19.49 kg/m?  ?Constitutional: Pleasant,well-developed, female in no acute distress. ?Abdominal: Soft, nondistended, nontender. There are no masses palpable.  ?DRE / Anoscopy - Cristela Felt CMA as standby - no fissure, small hemorrhoids but no clear source for bleeding or stigmata for bleeding, narrowed anal canal  ?Extremities: no edema ?Lymphadenopathy: No cervical adenopathy noted. ?Neurological: Alert and oriented to person place and time. ?Skin: Skin is warm and dry. No rashes noted. ?Psychiatric: Normal mood and affect. Behavior is normal. ? ? ?ASSESSMENT AND PLAN: ?21 year old female here for reassessment of the following: ? ?Rectal bleeding ?Constipation ?GERD ?Epigastric pain ?Nausea ?NSAID use ? ?Regarding her lower tract symptoms, constipation not adequately treated.  Certainly possible she is having hemorrhoid related bleeding however I do not see any significant hemorrhoids on exam/stigmata for bleeding.  With some of the  mucus she is passing and persistent symptoms I offered her a colonoscopy to make sure nothing else concerning is going on.  I discussed with colonoscopy is with the patient and her mother, they wanted to proceed after discussion of this.  In the interim we need to get more aggressive with her bowel regimen.  Increase MiraLAX to twice daily and even double dose twice daily as needed, titrate up or down as needed.  Gave her samples of Linzess 290 mcg that she can also use as needed if MiraLAX is not effective.  She can use Calmol 4 suppositories over-the-counter for hemorrhoids as needed ? ?Regarding her upper tract  symptoms, her labs are stable, H. pylori IgG negative, she uses NSAIDs routinely.  At risk for gastritis/PUD.  Recommend she stop Advil and all NSAIDs.  Start omeprazole 40 mg once daily, add Zofran 4 mg ODT as needed.  Hopefully this helps her symptoms over the upcoming several days.  If not may need an EGD to further evaluate as well.  She agreed ? ?Plan: ?- [facility] ?- increase Miralax to BID and titrate up as needed ?- samples of Linzess 290mcg / PRN ?- start Calmol4 suppository PRN ?- start omeprazole 40mg  / day ?- stop Advil and all NSAIDs ?- Zofran 4mg  q 6 hours ODT PRN #30 RF1 ?- if upper tract symptoms do not improve will then need an EGD ? ?Harlin RainSteve Vaudie Engebretsen, MD ?[facility] ? ?

## 2022-02-28 NOTE — Patient Instructions (Addendum)
If you are age 23 or older, your body mass index should be between 23-30. Your Body mass index is 19.49 kg/m?Marland Kitchen If this is out of the aforementioned range listed, please consider follow up with your Primary Care Provider. ? ?If you are age 15 or younger, your body mass index should be between 19-25. Your Body mass index is 19.49 kg/m?Marland Kitchen If this is out of the aformentioned range listed, please consider follow up with your Primary Care Provider.  ? ?________________________________________________________ ? ?The [facility] providers would like to encourage you to use [facility] to communicate with providers for non-urgent requests or questions.  Due to long hold times on the telephone, sending your provider a message by [facility] may be a faster and more efficient way to get a response.  Please allow 48 business hours for a response.  Please remember that this is for non-urgent requests.  ?_______________________________________________________ ? ?You have been scheduled for a colonoscopy. Please follow written instructions given to you at your visit today.  ?Please pick up your prep supplies at the pharmacy within the next 1-3 days. ?If you use inhalers (even only as needed), please bring them with you on the day of your procedure. ? ?We have sent the following medications to your pharmacy for you to pick up at your convenience: ?Zofran 4 mg ODT: dissolve 1 tablet orally every 8 hours as needed ?Omeprazole 40mg : Take once daily ? ?Stop Advil and all NSAIDs. ? ?Please purchase the following medications over the counter and take as directed:  ?Miralax: Take twice day and increase as needed ?Calmol 4 suppositories (you can buy on ) ? ?We have given you samples of the following medication to take: ?Linzess 290 mcg: Take once daily before breakfast (as needed) ? ?Thank you for entrusting me with your care and for choosing [employer] [facility], ?Dr. Conseco ? ? ? ? ? ? ?

## 2022-03-23 ENCOUNTER — Encounter: Payer: Self-pay | Admitting: Obstetrics

## 2022-03-23 ENCOUNTER — Other Ambulatory Visit (HOSPITAL_COMMUNITY)
Admission: RE | Admit: 2022-03-23 | Discharge: 2022-03-23 | Disposition: A | Discharge status: Home / Self Care | Payer: No Typology Code available for payment source | Source: Ambulatory Visit | Attending: Obstetrics and Gynecology | Admitting: Obstetrics and Gynecology

## 2022-03-23 ENCOUNTER — Ambulatory Visit (INDEPENDENT_AMBULATORY_CARE_PROVIDER_SITE_OTHER): Payer: No Typology Code available for payment source | Admitting: Obstetrics

## 2022-03-23 VITALS — BP 115/81 | HR 86 | Ht 60.0 in | Wt 102.6 lb

## 2022-03-23 DIAGNOSIS — N898 Other specified noninflammatory disorders of vagina: Secondary | ICD-10-CM | POA: Insufficient documentation

## 2022-03-23 DIAGNOSIS — K5909 Other constipation: Secondary | ICD-10-CM | POA: Diagnosis not present

## 2022-03-23 DIAGNOSIS — N760 Acute vaginitis: Secondary | ICD-10-CM

## 2022-03-23 DIAGNOSIS — B9689 Other specified bacterial agents as the cause of diseases classified elsewhere: Secondary | ICD-10-CM

## 2022-03-23 DIAGNOSIS — Z01419 Encounter for gynecological examination (general) (routine) without abnormal findings: Secondary | ICD-10-CM | POA: Insufficient documentation

## 2022-03-23 MED ORDER — METRONIDAZOLE 500 MG PO TABS: 500.0000 mg | ORAL_TABLET | Freq: Two times a day (BID) | ORAL | 2 refills | Status: DC

## 2022-03-23 NOTE — Progress Notes (Signed)
Subjective:        Sandra Vance is a 21 y.o. female here for a routine exam.  Current complaints: Malodorous vaginal discharge.    Personal health questionnaire:  Is patient Ashkenazi Jewish, have a family history of breast and/or ovarian cancer: yes Is there a family history of uterine cancer diagnosed at age < 48, gastrointestinal cancer, urinary tract cancer, family member who is a Personnel officer syndrome-associated carrier: no Is the patient overweight and hypertensive, family history of diabetes, personal history of gestational diabetes, preeclampsia or PCOS: no Is patient over 67, have PCOS,  family history of premature CHD under age 80, diabetes, smoke, have hypertension or peripheral artery disease:  no At any time, has a partner hit, kicked or otherwise hurt or frightened you?: no Over the past 2 weeks, have you felt down, depressed or hopeless?: no Over the past 2 weeks, have you felt little interest or pleasure in doing things?:no   Gynecologic History Patient's last menstrual period was 02/23/2022. Contraception: OCP (estrogen/progesterone) Last Pap: n/a. Results were: n/an/a Last mammogram: n/a. Results were: n/a  Obstetric History OB History  No obstetric history on file.    Past Medical History:  Diagnosis Date   Asthma    Constipation     History reviewed. No pertinent surgical history.   Current Outpatient Medications:    cyclobenzaprine (FLEXERIL) 5 MG tablet, Take 1 tablet (5 mg total) by mouth at bedtime as needed for muscle spasms., Disp: 30 tablet, Rfl: 0   hydrocortisone (ANUSOL-HC) 2.5 % rectal cream, Place 1 application. rectally 2 (two) times daily as needed for hemorrhoids., Disp: 30 g, Rfl: 1   hydroxychloroquine (PLAQUENIL) 200 MG tablet, Take 1 tablet (200 mg total) by mouth daily., Disp: 30 tablet, Rfl: 2   linaclotide (LINZESS) 290 MCG CAPS capsule, Take 1 capsule (290 mcg total) by mouth daily before breakfast., Disp: 12 capsule, Rfl: 0    metroNIDAZOLE (FLAGYL) 500 MG tablet, Take 1 tablet (500 mg total) by mouth 2 (two) times daily., Disp: 14 tablet, Rfl: 2   norgestimate-ethinyl estradiol (ORTHO-CYCLEN) 0.25-35 MG-MCG tablet, Take 1 tablet by mouth daily., Disp: , Rfl:    omeprazole (PRILOSEC) 40 MG capsule, Take 1 capsule (40 mg total) by mouth daily., Disp: 30 capsule, Rfl: 1   ondansetron (ZOFRAN-ODT) 4 MG disintegrating tablet, Take 1 tablet (4 mg total) by mouth every 8 (eight) hours as needed for nausea or vomiting., Disp: 30 tablet, Rfl: 1   polyethylene glycol (MIRALAX / GLYCOLAX) 17 g packet, Take 17 g by mouth 2 (two) times daily. Increase as needed, Disp: , Rfl:    Rectal Protectant-Emollient (CALMOL-4) 76-10 % SUPP, Use as directed once to twice daily, Disp: 6 suppository, Rfl: 0 Allergies  Allergen Reactions   Shellfish-Derived Products    Raspberry Itching and Rash    Social History   Tobacco Use   Smoking status: Never    Passive exposure: Never   Smokeless tobacco: Never  Substance Use Topics   Alcohol use: Yes    Comment: occasionally    Family History  Problem Relation Age of Onset   Lupus Mother    Asthma Mother    Irritable bowel syndrome Mother    Healthy Father    Autoimmune disease Sister    Asthma Sister    ADD / ADHD Sister    ADD / ADHD Sister    Asthma Sister    Asthma Brother    Sickle cell anemia Maternal Grandmother  Diabetes Maternal Grandfather    Prostate cancer Paternal Grandfather    Diabetes Maternal Aunt    Lupus Paternal Aunt    Fibromyalgia Paternal Aunt    Colon cancer Maternal Great-grandfather    Breast cancer Maternal Great-grandmother    Esophageal cancer Neg Hx    Stomach cancer Neg Hx    Pancreatic cancer Neg Hx    Colon polyps Neg Hx       Review of Systems  Constitutional: negative for fatigue and weight loss Respiratory: negative for cough and wheezing Cardiovascular: negative for chest pain, fatigue and palpitations Gastrointestinal: negative  for abdominal pain and change in bowel habits Musculoskeletal:negative for myalgias Neurological: negative for gait problems and tremors Behavioral/Psych: negative for abusive relationship, depression Endocrine: negative for temperature intolerance    Genitourinary: positive for malodorous vaginal discharge.  negative for abnormal menstrual periods, genital lesions, hot flashes, sexual problems  Integument/breast: negative for breast lump, breast tenderness, nipple discharge and skin lesion(s)    Objective:       BP 115/81   Pulse 86   Ht 5' (1.524 m)   Wt 102 lb 9.6 oz (46.5 kg)   LMP 02/23/2022   BMI 20.04 kg/m  General:   Alert and no distress  Skin:   no rash or abnormalities  Lungs:   clear to auscultation bilaterally  Heart:   regular rate and rhythm, S1, S2 normal, no murmur, click, rub or gallop  Breasts:   normal without suspicious masses, skin or nipple changes or axillary nodes  Abdomen:  normal findings: no organomegaly, soft, non-tender and no hernia  Pelvis:  External genitalia: normal general appearance Urinary system: urethral meatus normal and bladder without fullness, nontender Vaginal: normal without tenderness, induration or masses Cervix: normal appearance Adnexa: normal bimanual exam Uterus: anteverted and non-tender, normal size   Lab Review Urine pregnancy test Labs reviewed yes Radiologic studies reviewed no  I have spent a total of 20 minutes of face-to-face time, excluding clinical staff time, reviewing notes and preparing to see patient, ordering tests and/or medications, and counseling the patient.   Assessment:    1. Encounter for gynecological examination with Papanicolaou smear of cervix Rx: - Cytology - PAP( [facility])  2. Vaginal discharge Rx: - Cervicovaginal ancillary only( [facility])  3. BV (bacterial vaginosis) Rx: - metroNIDAZOLE (FLAGYL) 500 MG tablet; Take 1 tablet (500 mg total) by mouth 2 (two) times daily.  Dispense:  14 tablet; Refill: 2  4. Constipation, chronic - followed by GI - taking Linzess.  Probable IBS.    Plan:    Education reviewed: calcium supplements, depression evaluation, low fat, low cholesterol diet, safe sex/STD prevention, self breast exams, and weight bearing exercise. Contraception: OCP (estrogen/progesterone). Follow up in: 1 year.   Meds ordered this encounter  Medications   metroNIDAZOLE (FLAGYL) 500 MG tablet    Sig: Take 1 tablet (500 mg total) by mouth 2 (two) times daily.    Dispense:  14 tablet    Refill:  2      Brock Bad, MD 03/23/2022 10:01 AM

## 2022-03-23 NOTE — Progress Notes (Signed)
New Patient is in the office for annual 1st pap smear LMP 02/23/2022 currently on [facility] pills Wants to discuss alternative BC options, pt states that she has been having a vaginal odor since she started them.

## 2022-03-26 LAB — CERVICOVAGINAL ANCILLARY ONLY
Bacterial Vaginitis (gardnerella): POSITIVE — AB
Candida Glabrata: NEGATIVE
Candida Vaginitis: POSITIVE — AB
Chlamydia: NEGATIVE
Comment: NEGATIVE
Comment: NEGATIVE
Comment: NEGATIVE
Comment: NEGATIVE
Comment: NEGATIVE
Comment: NORMAL
Neisseria Gonorrhea: NEGATIVE
Trichomonas: NEGATIVE

## 2022-03-27 ENCOUNTER — Encounter: Payer: Self-pay | Admitting: Emergency Medicine

## 2022-03-27 ENCOUNTER — Other Ambulatory Visit: Payer: Self-pay | Admitting: Obstetrics

## 2022-03-27 DIAGNOSIS — B3731 Acute candidiasis of vulva and vagina: Secondary | ICD-10-CM

## 2022-03-27 LAB — CYTOLOGY - PAP
Adequacy: ABSENT
Comment: NEGATIVE
Diagnosis: UNDETERMINED — AB
High risk HPV: NEGATIVE

## 2022-03-27 MED ORDER — FLUCONAZOLE 150 MG PO TABS: 150.0000 mg | ORAL_TABLET | Freq: Once | ORAL | 0 refills | Status: AC

## 2022-03-28 ENCOUNTER — Encounter: Payer: Self-pay | Admitting: Certified Registered Nurse Anesthetist

## 2022-03-29 ENCOUNTER — Encounter: Payer: Self-pay | Admitting: Gastroenterology

## 2022-04-04 ENCOUNTER — Encounter: Payer: Self-pay | Admitting: Gastroenterology

## 2022-04-04 ENCOUNTER — Ambulatory Visit (AMBULATORY_SURGERY_CENTER): Payer: No Typology Code available for payment source | Admitting: Gastroenterology

## 2022-04-04 VITALS — BP 112/72 | HR 83 | Temp 98.1°F | Resp 22 | Ht 60.0 in | Wt 99.0 lb

## 2022-04-04 DIAGNOSIS — K625 Hemorrhage of anus and rectum: Secondary | ICD-10-CM | POA: Diagnosis present

## 2022-04-04 DIAGNOSIS — K5909 Other constipation: Secondary | ICD-10-CM

## 2022-04-04 DIAGNOSIS — K648 Other hemorrhoids: Secondary | ICD-10-CM

## 2022-04-04 LAB — POCT URINE PREGNANCY: Preg Test, Ur: NEGATIVE

## 2022-04-04 MED ORDER — SODIUM CHLORIDE 0.9 % IV SOLN: 500.0000 mL | Freq: Once | INTRAVENOUS | Status: DC

## 2022-04-04 NOTE — Progress Notes (Signed)
Report given to PACU, vss 

## 2022-04-04 NOTE — Patient Instructions (Signed)
YOU HAD AN ENDOSCOPIC PROCEDURE TODAY AT THE [facility]:   Refer to the procedure report that was given to you for any specific questions about what was found during the examination.  If the procedure report does not answer your questions, please call your gastroenterologist to clarify.  If you requested that your care partner not be given the details of your procedure findings, then the procedure report has been included in a sealed envelope for you to review at your convenience later.  YOU SHOULD EXPECT: Some feelings of bloating in the abdomen. Passage of more gas than usual.  Walking can help get rid of the air that was put into your GI tract during the procedure and reduce the bloating. If you had a lower endoscopy (such as a colonoscopy or flexible sigmoidoscopy) you may notice spotting of blood in your stool or on the toilet paper. If you underwent a bowel prep for your procedure, you may not have a normal bowel movement for a few days.  Please Note:  You might notice some irritation and congestion in your nose or some drainage.  This is from the oxygen used during your procedure.  There is no need for concern and it should clear up in a day or so.  SYMPTOMS TO REPORT IMMEDIATELY:  Following lower endoscopy (colonoscopy or flexible sigmoidoscopy):  Excessive amounts of blood in the stool  Significant tenderness or worsening of abdominal pains  Swelling of the abdomen that is new, acute  Fever of 100F or higher  Following upper endoscopy (EGD)  Vomiting of blood or coffee ground material  New chest pain or pain under the shoulder blades  Painful or persistently difficult swallowing  New shortness of breath  Fever of 100F or higher  Black, tarry-looking stools  For urgent or emergent issues, a gastroenterologist can be reached at any hour by calling [phone]. Do not use MyChart messaging for urgent concerns.    DIET:  We do recommend a small meal at first, but  then you may proceed to your regular diet.  Drink plenty of fluids but you should avoid alcoholic beverages for 24 hours.  ACTIVITY:  You should plan to take it easy for the rest of today and you should NOT DRIVE or use heavy machinery until tomorrow (because of the sedation medicines used during the test).    FOLLOW UP: Our staff will call the number listed on your records 24-72 hours following your procedure to check on you and address any questions or concerns that you may have regarding the information given to you following your procedure. If we do not reach you, we will leave a message.  We will attempt to reach you two times.  During this call, we will ask if you have developed any symptoms of COVID 19. If you develop any symptoms (ie: fever, flu-like symptoms, shortness of breath, cough etc.) before then, please call [phone].  If you test positive for Covid 19 in the 2 weeks post procedure, please call and report this information to us.    If any biopsies were taken you will be contacted by phone or by letter within the next 1-3 weeks.  Please call us at [phone] if you have not heard about the biopsies in 3 weeks.    SIGNATURES/CONFIDENTIALITY: You and/or your care partner have signed paperwork which will be entered into your electronic medical record.  These signatures attest to the fact that that the information above on your After   Visit Summary has been reviewed and is understood.  Full responsibility of the confidentiality of this discharge information lies with you and/or your care-partner.  

## 2022-04-04 NOTE — Op Note (Signed)
[facility] Patient Name: Sandra Vance Procedure Date: 04/04/2022 1:24 PM MRN: [id number] Endoscopist: Viviann SpareSteven P. Adela LankArmbruster , MD Age: [age] Referring MD:  Date of Birth: 11/11/2000 Gender: Female Account #: [id number] Procedure:                Colonoscopy Indications:              Rectal bleeding, history of constipation - bowels                            under much better control with use of higher dose                            Miralax daily Medicines:                Monitored Anesthesia Care Procedure:                Pre-Anesthesia Assessment:                           - Prior to the procedure, a History and Physical                            was performed, and patient medications and                            allergies were reviewed. The patient's tolerance of                            previous anesthesia was also reviewed. The risks                            and benefits of the procedure and the sedation                            options and risks were discussed with the patient.                            All questions were answered, and informed consent                            was obtained. Prior Anticoagulants: The patient has                            taken no previous anticoagulant or antiplatelet                            agents. ASA Grade Assessment: II - A patient with                            mild systemic disease. After reviewing the risks                            and benefits, the patient was deemed in  satisfactory condition to undergo the procedure.                           After obtaining informed consent, the colonoscope                            was passed under direct vision. Throughout the                            procedure, the patient's blood pressure, pulse, and                            oxygen saturations were monitored continuously. The                            0405 PCF-H190TL Slim SB Colonoscope was  introduced                            through the anus and advanced to the the terminal                            ileum, with identification of the appendiceal                            orifice and IC valve. The colonoscopy was performed                            without difficulty. The patient tolerated the                            procedure well. The quality of the bowel                            preparation was good. The terminal ileum, ileocecal                            valve, appendiceal orifice, and rectum were                            photographed. Scope In: 1:34:21 PM Scope Out: 1:49:12 PM Scope Withdrawal Time: 0 hours 10 minutes 52 seconds  Total Procedure Duration: 0 hours 14 minutes 51 seconds  Findings:                 The perianal and digital rectal examinations were                            normal. No anal fissure appreciated.                           The terminal ileum appeared normal.                           The colon was tortuous. Pediatric ultraslim scope  used for this exam.                           Internal hemorrhoids were found during retroflexion.                           The exam was otherwise without abnormality. Complications:            No immediate complications. Estimated blood loss:                            Minimal. Estimated Blood Loss:     Estimated blood loss was minimal. Impression:               - The examined portion of the ileum was normal.                           - Tortuous colon.                           - Internal hemorrhoids.                           - The examination was otherwise normal.                           Hemorrhoids are the cause of the patient's symptoms                            in the setting of constipation. Recommendation:           - Patient has a contact number available for                            emergencies. The signs and symptoms of potential                             delayed complications were discussed with the                            patient. Return to normal activities tomorrow.                            Written discharge instructions were provided to the                            patient.                           - Resume previous diet.                           - Continue present medications.                           - Coninue Miralax daily                           -  Trial of Calmol4 suppositories as needed (can get                            over the counter) for hemorrhoids                           - If bleeding persists over time, consideration for                            hemorrhoid banding                           - Repeat colonoscopy age 55 for screening purposes. Viviann Spare P. Launa Goedken, MD 04/04/2022 1:54:13 PM This report has been signed electronically.

## 2022-04-04 NOTE — Progress Notes (Signed)
[facility] History and Physical   Primary Care Physician:  Georganna Skeans, MD   Reason for Procedure:   Rectal bleeding, constipation  Plan:    colonoscopy     HPI: Sandra Vance is a 21 y.o. female  here for colonoscopy to evaluate rectal bleeding, constipation, mucous in stools. Now on miralaxzand Linzess . Otherwise feels well without any cardiopulmonary symptoms.    Past Medical History:  Diagnosis Date   Asthma    Constipation     History reviewed. No pertinent surgical history.  Prior to Admission medications   Medication Sig Start Date End Date Taking? Authorizing Provider  cyclobenzaprine (FLEXERIL) 5 MG tablet Take 1 tablet (5 mg total) by mouth at bedtime as needed for muscle spasms. 02/07/22  Yes Rice, Jamesetta Orleans, MD  hydroxychloroquine (PLAQUENIL) 200 MG tablet Take 1 tablet (200 mg total) by mouth daily. 02/07/22  Yes Rice, Jamesetta Orleans, MD  linaclotide [facility]) 290 MCG CAPS capsule Take 1 capsule (290 mcg total) by mouth daily before breakfast. 02/28/22  Yes [name], [name], MD  metroNIDAZOLE (FLAGYL) 500 MG tablet Take 1 tablet (500 mg total) by mouth 2 (two) times daily. 03/23/22  Yes Brock Bad, MD  norgestimate-ethinyl estradiol (ORTHO-CYCLEN) 0.25-35 MG-MCG tablet Take 1 tablet by mouth daily.   Yes [provider]  ondansetron (ZOFRAN-ODT) 4 MG disintegrating tablet Take 1 tablet (4 mg total) by mouth every 8 (eight) hours as needed for nausea or vomiting. 02/28/22  Yes [name], [name], MD  hydrocortisone (ANUSOL-HC) 2.5 % rectal cream Place 1 application. rectally 2 (two) times daily as needed for hemorrhoids. 01/09/22   Doree Albee, PA-C  omeprazole (PRILOSEC) 40 MG capsule Take 1 capsule (40 mg total) by mouth daily. 02/28/22   [name], [name], MD  polyethylene glycol (MIRALAX / GLYCOLAX) 17 g packet Take 17 g by mouth 2 (two) times daily. Increase as needed    [provider]  Rectal  Protectant-Emollient (CALMOL-4) 76-10 % SUPP Use as directed once to twice daily 02/28/22   [name], [name], MD    Current Outpatient Medications  Medication Sig Dispense Refill   cyclobenzaprine (FLEXERIL) 5 MG tablet Take 1 tablet (5 mg total) by mouth at bedtime as needed for muscle spasms. 30 tablet 0   hydroxychloroquine (PLAQUENIL) 200 MG tablet Take 1 tablet (200 mg total) by mouth daily. 30 tablet 2   linaclotide (LINZESS) 290 MCG CAPS capsule Take 1 capsule (290 mcg total) by mouth daily before breakfast. 12 capsule 0   metroNIDAZOLE (FLAGYL) 500 MG tablet Take 1 tablet (500 mg total) by mouth 2 (two) times daily. 14 tablet 2   norgestimate-ethinyl estradiol (ORTHO-CYCLEN) 0.25-35 MG-MCG tablet Take 1 tablet by mouth daily.     ondansetron (ZOFRAN-ODT) 4 MG disintegrating tablet Take 1 tablet (4 mg total) by mouth every 8 (eight) hours as needed for nausea or vomiting. 30 tablet 1   hydrocortisone (ANUSOL-HC) 2.5 % rectal cream Place 1 application. rectally 2 (two) times daily as needed for hemorrhoids. 30 g 1   omeprazole (PRILOSEC) 40 MG capsule Take 1 capsule (40 mg total) by mouth daily. 30 capsule 1   polyethylene glycol (MIRALAX / GLYCOLAX) 17 g packet Take 17 g by mouth 2 (two) times daily. Increase as needed     Rectal Protectant-Emollient (CALMOL-4) 76-10 % SUPP Use as directed once to twice daily 6 suppository 0   Current Facility-Administered Medications  Medication Dose Route Frequency Provider Last Rate Last Admin  0.9 %  sodium chloride infusion  500 mL Intravenous Once [name], [name], MD        Allergies as of 04/04/2022 - Review Complete 04/04/2022  Allergen Reaction Noted   Shellfish-derived products  11/07/2021   Raspberry Itching and Rash 12/14/2021    Family History  Problem Relation Age of Onset   Lupus Mother    Asthma Mother    Irritable bowel syndrome Mother    Healthy Father    Autoimmune disease Sister    Asthma Sister    ADD / ADHD  Sister    ADD / ADHD Sister    Asthma Sister    Asthma Brother    Sickle cell anemia Maternal Grandmother    Diabetes Maternal Grandfather    Prostate cancer Paternal Grandfather    Diabetes Maternal Aunt    Lupus Paternal Aunt    Fibromyalgia Paternal Aunt    Colon cancer Maternal Great-grandfather    Breast cancer Maternal Great-grandmother    Esophageal cancer Neg Hx    Stomach cancer Neg Hx    Pancreatic cancer Neg Hx    Colon polyps Neg Hx     Social History   Socioeconomic History   Marital status: Significant Other    Spouse name: Not on file   Number of children: Not on file   Years of education: Not on file   Highest education level: Not on file  Occupational History   Occupation: Web designer: [employer]  Tobacco Use   Smoking status: Never    Passive exposure: Never   Smokeless tobacco: Never  Vaping Use   Vaping Use: Former  Substance and Sexual Activity   Alcohol use: Yes    Comment: occasionally   Drug use: No   Sexual activity: Yes    Birth control/protection: Pill, OCP  Other Topics Concern   Not on file  Social History Narrative   Not on file   Social Determinants of Health   Financial Resource Strain: Not on file  Food Insecurity: Not on file  Transportation Needs: Not on file  Physical Activity: Not on file  Stress: Not on file  Social Connections: Not on file  Intimate Partner Violence: Not on file    Review of Systems: All other review of systems negative except as mentioned in the HPI.  Physical Exam: Vital signs BP 125/72   Pulse 76   Temp 98.1 F (36.7 C)   Ht 5' (1.524 m)   Wt 99 lb (44.9 kg)   LMP 02/23/2022   SpO2 100%   BMI 19.33 kg/m   General:   Alert,  Well-developed, pleasant and cooperative in NAD Lungs:  Clear throughout to auscultation.   Heart:  Regular rate and rhythm Abdomen:  Soft, nontender and nondistended.   Neuro/Psych:  Alert and cooperative. Normal mood and affect. A and O x  3  Harlin Rain, MD [facility]

## 2022-04-05 ENCOUNTER — Telehealth: Payer: Self-pay | Admitting: *Deleted

## 2022-04-05 NOTE — Telephone Encounter (Signed)
Attempted f/u phone call. No answer. Left message. °

## 2022-04-05 NOTE — Telephone Encounter (Signed)
No answer for post procedure call back. 

## 2022-04-08 NOTE — Progress Notes (Signed)
Office Visit Note  Patient: Sandra Vance             Date of Birth: 09-Nov-2000           MRN: [id number]             PCP: Georganna Skeans, MD Referring: Georganna Skeans, MD Visit Date: 04/09/2022   Subjective:  Follow-up (Improving, bil hand and ankle swelling and pain)   History of Present Illness: Sandra Vance is a 21 y.o. 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 Recommended starting HCQ after last visit also vitamin D supplementation, skin UV protection. She feels the daily fatigue is somewhat improved after starting HCQ. She still has joint pain in both hands and feet and some in left knee. Pain is often worse by the end of the day and has increased swelling. She is concerned about some progression joint abnormalities at the right hand. She notices increase in foamy appearing urine, not sure exactly when this started or increased.   Previous HPI 02/07/22 Sandra Vance is a 21 y.o. female here for follow up for positive ANA associated with joint pains, rashes, lymphadenopathy, and fatigue. Findings were nonspecific at initial visit. She continues with symptoms of intermittent hand swelling and stiffness. Her feet also with swelling and discoloration intermittently. Skin rashes on the face and neck with some redness and some hyperpigmentation on the neck area. She feels itching and skin irritation with sun exposure but no visible rashes. More recently she went to the ED after developing severe back and flank pain found to have pyelonephritis treated with antibiotics and symptoms resolved. Subsequently in January developed severe diarrhea and visited the ED treated with antiemetics and pain controlled improved without specific cause identified. Since that time she has persistent constipation. 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   Previous HPI 04/07/21 Sandra Vance is a 21 y.o. female here  for evaluation of positive ANA and symptoms with fatigue, arthralgia, swelling, GI symptoms with weight loss, urinary frequency, and alopecia. She was previously evaluated by pediatric rheumatology without active autoimmune disease identified associated with significantly positive ANA titer. Her joint pains are chronic since years ago but has noticed more recently some worsening with pain particularly in bilateral hands, knees, and feet. She notices pain and sometimes swelling more often in her right thumb and in the bilateral feet extending up to the ankles. She often notices this after a day of working. She takes ibuprofen sometimes as needed with a benefit. She reports skin rash on the back of her neck with an area of erythema and hair thinning identified in PCP office as eczema. She also notices intermittent rash on her arms bilaterally she describes as small red spots lasting for up to several days. These seem to be sensitive to heat and do not leave residual discoloration. She has chronic GI problems with early satiety, nausea and vomiting with eating full meals, inability to gain any weight. This was evaluated with gastroenterology years ago without any specific diagnosis or treatment apparently. Urinary frequency is ongoing since around April. She reports having to urinate about 5 times at night with small amounts of volume. She has had microscopic hematuria on urine samples and suspicion for kidney stones, with some flank pain previously but this pain improved without change in hematuria and frequency.    Labs reviewed ANA 1:80 speckled RF neg CCP neg  02/2016 ANA 1:640 speckled SSA neg SSB neg dsDNA neg Smith neg Complements C3, C4 wnl   Review of Systems  Constitutional:  Positive for fatigue.  HENT:  Negative for mouth dryness.   Eyes:  Positive for dryness.  Respiratory:  Negative for shortness of breath.   Cardiovascular:  Positive for swelling in legs/feet.  Gastrointestinal:   Positive for constipation.  Endocrine: Negative for excessive thirst.  Genitourinary:  Negative for difficulty urinating.  Musculoskeletal:  Positive for joint pain, joint pain, joint swelling, muscle weakness and muscle tenderness.  Skin:  Negative for rash.  Allergic/Immunologic: Negative for susceptible to infections.  Neurological:  Positive for numbness.  Hematological:  Negative for bruising/bleeding tendency.  Psychiatric/Behavioral:  Negative for sleep disturbance.     PMFS History:  Patient Active Problem List   Diagnosis Date Noted   Raynaud's syndrome without gangrene 04/09/2022   Upper back pain 02/07/2022   Pyelonephritis 06/08/2021   Asthma in adult, mild intermittent, uncomplicated 06/08/2021   Positive ANA (antinuclear antibody) 04/07/2021   Arthralgia 04/07/2021   Weight loss 04/07/2021   Lymphadenopathy of head and neck 04/07/2021   Rash 04/07/2021   Urinary frequency 04/07/2021    Past Medical History:  Diagnosis Date   Asthma    Constipation     Family History  Problem Relation Age of Onset   Lupus Mother    Asthma Mother    Irritable bowel syndrome Mother    Healthy Father    Autoimmune disease Sister    Asthma Sister    ADD / ADHD Sister    ADD / ADHD Sister    Asthma Sister    Asthma Brother    Sickle cell anemia Maternal Grandmother    Diabetes Maternal Grandfather    Prostate cancer Paternal Grandfather    Diabetes Maternal Aunt    Lupus Paternal Aunt    Fibromyalgia Paternal Aunt    Colon cancer Maternal Great-grandfather    Breast cancer Maternal Great-grandmother    Esophageal cancer Neg Hx    Stomach cancer Neg Hx    Pancreatic cancer Neg Hx    Colon polyps Neg Hx    History reviewed. No pertinent surgical history. Social History   Social History Narrative   Not on file   Immunization History  Administered Date(s) Administered   Influenza,inj,Quad PF,6-35 Mos 09/18/2021   Tdap 10/26/2020     Objective: Vital Signs: BP  103/66 (BP Location: Left Arm, Patient Position: Sitting, Cuff Size: Normal)   Pulse 97   Resp 14   Ht 5' (1.524 m)   Wt 106 lb (48.1 kg)   LMP 02/23/2022   BMI 20.70 kg/m    Physical Exam HENT:     Mouth/Throat:     Mouth: Mucous membranes are moist.     Pharynx: Oropharynx is clear.  Eyes:     Conjunctiva/sclera: Conjunctivae normal.  Cardiovascular:     Rate and Rhythm: Normal rate and regular rhythm.  Pulmonary:     Effort: Pulmonary effort is normal.     Breath sounds: Normal breath sounds.  Musculoskeletal:     Right lower leg: No edema.     Left lower leg: No edema.  Skin:    General: Skin is warm and dry.     Findings: No rash.  Neurological:     Mental Status: She is alert.  Psychiatric:        Mood and Affect: Mood normal.      Musculoskeletal Exam:  Elbows full  ROM no tenderness or swelling Wrists full ROM no tenderness or swelling Fingers full ROM, PIP joint tenderness and hyperextensibility in several fingers worst in right thumb Knees full ROM, left knee tenderness anteriorly and medial patellar tendon Ankles full ROM no tenderness or swelling MTPs full ROM no tenderness or swelling   Investigation: No additional findings.  Imaging: No results found.  Recent Labs: Lab Results  Component Value Date   WBC 5.1 04/09/2022   HGB 13.5 04/09/2022   PLT 281 04/09/2022   NA 139 04/09/2022   K 4.2 04/09/2022   CL 106 04/09/2022   CO2 25 04/09/2022   GLUCOSE 77 04/09/2022   BUN 12 04/09/2022   CREATININE 0.82 04/09/2022   BILITOT 0.5 01/09/2022   ALKPHOS 49 01/09/2022   AST 13 01/09/2022   ALT 8 01/09/2022   PROT 7.1 01/09/2022   ALBUMIN 4.3 01/09/2022   CALCIUM 9.5 04/09/2022   GFRAA NOT CALCULATED 05/13/2017    Speciality Comments: No specialty comments available.  Procedures:  No procedures performed Allergies: Shellfish-derived products and Raspberry   Assessment / Plan:     Visit Diagnoses: Positive ANA (antinuclear antibody) -  Plan: Sedimentation rate, Protein / creatinine ratio, urine, Urinalysis, Routine w reflex microscopic, CBC with Differential/Platelet, BASIC METABOLIC PANEL WITH GFR  Positive ANA with associated Raynaud's rashes and arthralgias appears to have some improvement since starting the hydroxychloroquine.  Patient is concerned about joint changes I do not see any definite progression of this though she has some noticeable hyperextensibility.  We will recheck sed rate for inflammatory monitoring though her baseline has been normal.  Rechecking CBC and BMP.  Plan to continue hydroxychloroquine 200 mg daily.  Arthralgia, unspecified joint  Urinary frequency - Plan: Sedimentation rate, Protein / creatinine ratio, urine, Urinalysis, Routine w reflex microscopic, CBC with Differential/Platelet, BASIC METABOLIC PANEL WITH GFR  Concerned about urinary frequency and foamy appearance I do not think this is related to hydroxychloroquine though proteinuria has been a rarely reported possible side effect.  Also screening again to rule out new renal involvement of an inflammatory process.  Checking urinalysis and protein creatinine ratio.  Raynaud's syndrome without gangrene  No ischemic changes or injury on exam doing better in 100 temperatures at the moment.  Orders: Orders Placed This Encounter  Procedures   MICROSCOPIC MESSAGE   Sedimentation rate   Protein / creatinine ratio, urine   Urinalysis, Routine w reflex microscopic   CBC with Differential/Platelet   BASIC METABOLIC PANEL WITH GFR   No orders of the defined types were placed in this encounter.    Follow-Up Instructions: Return in about 3 months (around 07/10/2022) for Arthralgias/foamy urine/?UCTD.   Fuller Plan, MD  Note - This record has been created using [employer].  Chart creation errors have been sought, but may not always  have been located. Such creation errors do not reflect on  the standard of medical care.

## 2022-04-09 ENCOUNTER — Encounter: Payer: Self-pay | Admitting: Internal Medicine

## 2022-04-09 ENCOUNTER — Ambulatory Visit (INDEPENDENT_AMBULATORY_CARE_PROVIDER_SITE_OTHER): Payer: No Typology Code available for payment source | Admitting: Internal Medicine

## 2022-04-09 VITALS — BP 103/66 | HR 97 | Resp 14 | Ht 60.0 in | Wt 106.0 lb

## 2022-04-09 DIAGNOSIS — M2550 Pain in unspecified joint: Secondary | ICD-10-CM

## 2022-04-09 DIAGNOSIS — R7689 Other specified abnormal immunological findings in serum: Secondary | ICD-10-CM

## 2022-04-09 DIAGNOSIS — I7300 Raynaud's syndrome without gangrene: Secondary | ICD-10-CM | POA: Insufficient documentation

## 2022-04-09 DIAGNOSIS — R350 Frequency of micturition: Secondary | ICD-10-CM

## 2022-04-09 DIAGNOSIS — R768 Other specified abnormal immunological findings in serum: Secondary | ICD-10-CM | POA: Diagnosis not present

## 2022-04-09 DIAGNOSIS — R21 Rash and other nonspecific skin eruption: Secondary | ICD-10-CM | POA: Diagnosis not present

## 2022-04-10 LAB — PROTEIN / CREATININE RATIO, URINE
Creatinine, Urine: 195 mg/dL (ref 20–275)
Protein/Creat Ratio: 56 mg/g creat (ref 24–184)
Protein/Creatinine Ratio: 0.056 mg/mg creat (ref 0.024–0.184)
Total Protein, Urine: 11 mg/dL (ref 5–24)

## 2022-04-10 LAB — CBC WITH DIFFERENTIAL/PLATELET
Absolute Monocytes: 342 cells/uL (ref 200–950)
Basophils Absolute: 51 cells/uL (ref 0–200)
Basophils Relative: 1 %
Eosinophils Absolute: 133 cells/uL (ref 15–500)
Eosinophils Relative: 2.6 %
HCT: 38.3 % (ref 35.0–45.0)
Hemoglobin: 13.5 g/dL (ref 11.7–15.5)
Lymphs Abs: 1693 cells/uL (ref 850–3900)
MCH: 29.8 pg (ref 27.0–33.0)
MCHC: 35.2 g/dL (ref 32.0–36.0)
MCV: 84.5 fL (ref 80.0–100.0)
MPV: 10.8 fL (ref 7.5–12.5)
Monocytes Relative: 6.7 %
Neutro Abs: 2882 cells/uL (ref 1500–7800)
Neutrophils Relative %: 56.5 %
Platelets: 281 10*3/uL (ref 140–400)
RBC: 4.53 10*6/uL (ref 3.80–5.10)
RDW: 12.8 % (ref 11.0–15.0)
Total Lymphocyte: 33.2 %
WBC: 5.1 10*3/uL (ref 3.8–10.8)

## 2022-04-10 LAB — URINALYSIS, ROUTINE W REFLEX MICROSCOPIC
Bacteria, UA: NONE SEEN /HPF
Bilirubin Urine: NEGATIVE
Glucose, UA: NEGATIVE
Hyaline Cast: NONE SEEN /LPF
Ketones, ur: NEGATIVE
Leukocytes,Ua: NEGATIVE
Nitrite: NEGATIVE
Protein, ur: NEGATIVE
Specific Gravity, Urine: 1.027 (ref 1.001–1.035)
pH: 6 (ref 5.0–8.0)

## 2022-04-10 LAB — BASIC METABOLIC PANEL WITH GFR
BUN: 12 mg/dL (ref 7–25)
CO2: 25 mmol/L (ref 20–32)
Calcium: 9.5 mg/dL (ref 8.6–10.2)
Chloride: 106 mmol/L (ref 98–110)
Creat: 0.82 mg/dL (ref 0.50–0.96)
Glucose, Bld: 77 mg/dL (ref 65–99)
Potassium: 4.2 mmol/L (ref 3.5–5.3)
Sodium: 139 mmol/L (ref 135–146)
eGFR: 104 mL/min/{1.73_m2} (ref >=60)

## 2022-04-10 LAB — SEDIMENTATION RATE: Sed Rate: 2 mm/h (ref 0–20)

## 2022-04-10 LAB — MICROSCOPIC MESSAGE

## 2022-05-09 ENCOUNTER — Encounter: Payer: No Typology Code available for payment source | Admitting: Family Medicine

## 2022-06-13 ENCOUNTER — Encounter: Payer: Self-pay | Admitting: Family Medicine

## 2022-06-13 ENCOUNTER — Other Ambulatory Visit: Payer: Self-pay | Admitting: Family Medicine

## 2022-06-13 ENCOUNTER — Ambulatory Visit (INDEPENDENT_AMBULATORY_CARE_PROVIDER_SITE_OTHER): Payer: No Typology Code available for payment source | Admitting: Family Medicine

## 2022-06-13 VITALS — BP 108/71 | HR 73 | Temp 98.0°F | Resp 16 | Ht 60.0 in | Wt 104.5 lb

## 2022-06-13 DIAGNOSIS — Z0000 Encounter for general adult medical examination without abnormal findings: Secondary | ICD-10-CM

## 2022-06-13 DIAGNOSIS — Z130 Encounter for screening for diseases of the blood and blood-forming organs and certain disorders involving the immune mechanism: Secondary | ICD-10-CM

## 2022-06-13 DIAGNOSIS — Z13220 Encounter for screening for lipoid disorders: Secondary | ICD-10-CM

## 2022-06-13 NOTE — Progress Notes (Signed)
Patient is  here for CPE. Patient would like to talk about her swollen ankle and rapid HR that has been present for some time.

## 2022-06-14 ENCOUNTER — Encounter: Payer: Self-pay | Admitting: Family Medicine

## 2022-06-14 LAB — CBC WITH DIFFERENTIAL/PLATELET
Basophils Absolute: 0 10*3/uL (ref 0.0–0.2)
Basos: 1 %
EOS (ABSOLUTE): 0.1 10*3/uL (ref 0.0–0.4)
Eos: 1 %
Hematocrit: 39.7 % (ref 34.0–46.6)
Hemoglobin: 13.6 g/dL (ref 11.1–15.9)
Immature Grans (Abs): 0 10*3/uL (ref 0.0–0.1)
Immature Granulocytes: 0 %
Lymphocytes Absolute: 1 10*3/uL (ref 0.7–3.1)
Lymphs: 20 %
MCH: 28.6 pg (ref 26.6–33.0)
MCHC: 34.3 g/dL (ref 31.5–35.7)
MCV: 83 fL (ref 79–97)
Monocytes Absolute: 0.3 10*3/uL (ref 0.1–0.9)
Monocytes: 5 %
Neutrophils Absolute: 3.8 10*3/uL (ref 1.4–7.0)
Neutrophils: 73 %
Platelets: 293 10*3/uL (ref 150–450)
RBC: 4.76 x10E6/uL (ref 3.77–5.28)
RDW: 12.7 % (ref 11.7–15.4)
WBC: 5.2 10*3/uL (ref 3.4–10.8)

## 2022-06-14 LAB — COMPREHENSIVE METABOLIC PANEL
ALT: 12 IU/L (ref 0–32)
AST: 14 IU/L (ref 0–40)
Albumin/Globulin Ratio: 1.9 (ref 1.2–2.2)
Albumin: 4.6 g/dL (ref 4.0–5.0)
Alkaline Phosphatase: 65 IU/L (ref 44–121)
BUN/Creatinine Ratio: 14 (ref 9–23)
BUN: 12 mg/dL (ref 6–20)
Bilirubin Total: 0.5 mg/dL (ref 0.0–1.2)
CO2: 18 mmol/L — ABNORMAL LOW (ref 20–29)
Calcium: 9.5 mg/dL (ref 8.7–10.2)
Chloride: 106 mmol/L (ref 96–106)
Creatinine, Ser: 0.83 mg/dL (ref 0.57–1.00)
Globulin, Total: 2.4 g/dL (ref 1.5–4.5)
Glucose: 113 mg/dL — ABNORMAL HIGH (ref 70–99)
Potassium: 4 mmol/L (ref 3.5–5.2)
Sodium: 143 mmol/L (ref 134–144)
Total Protein: 7 g/dL (ref 6.0–8.5)
eGFR: 102 mL/min/{1.73_m2} (ref >59)

## 2022-06-14 LAB — LIPID PANEL W/O CHOL/HDL RATIO
Cholesterol, Total: 143 mg/dL (ref 100–199)
HDL: 49 mg/dL (ref >39)
LDL Chol Calc (NIH): 85 mg/dL (ref 0–99)
Triglycerides: 37 mg/dL (ref 0–149)
VLDL Cholesterol Cal: 9 mg/dL (ref 5–40)

## 2022-06-14 LAB — TSH: TSH: 0.445 u[IU]/mL — ABNORMAL LOW (ref 0.450–4.500)

## 2022-06-14 NOTE — Progress Notes (Signed)
Established Patient Office Visit  Subjective    Patient ID: Sandra Vance, female    DOB: 2001/05/18  Age: 21 y.o. MRN: [id number]  CC:  Chief Complaint  Patient presents with   Annual Exam    HPI Sandra Vance presents for routine annual exam. Patient denies acute complaints or concerns.    Outpatient Encounter Medications as of 06/13/2022  Medication Sig   AVIANE 0.1-20 MG-MCG tablet Take 1 tablet by mouth daily.   cyclobenzaprine (FLEXERIL) 5 MG tablet Take 1 tablet (5 mg total) by mouth at bedtime as needed for muscle spasms.   hydrocortisone (ANUSOL-HC) 2.5 % rectal cream Place 1 application. rectally 2 (two) times daily as needed for hemorrhoids.   hydroxychloroquine (PLAQUENIL) 200 MG tablet Take 1 tablet (200 mg total) by mouth daily.   metroNIDAZOLE (FLAGYL) 500 MG tablet Take 1 tablet (500 mg total) by mouth 2 (two) times daily.   norgestimate-ethinyl estradiol (ORTHO-CYCLEN) 0.25-35 MG-MCG tablet Take 1 tablet by mouth daily.   omeprazole (PRILOSEC) 40 MG capsule Take 1 capsule (40 mg total) by mouth daily.   polyethylene glycol (MIRALAX / GLYCOLAX) 17 g packet Take 17 g by mouth 2 (two) times daily. Increase as needed   Rectal Protectant-Emollient (CALMOL-4) 76-10 % SUPP Use as directed once to twice daily   linaclotide (LINZESS) 290 MCG CAPS capsule Take 1 capsule (290 mcg total) by mouth daily before breakfast. (Patient not taking: Reported on 06/13/2022)   [DISCONTINUED] ondansetron (ZOFRAN-ODT) 4 MG disintegrating tablet Take 1 tablet (4 mg total) by mouth every 8 (eight) hours as needed for nausea or vomiting.   No facility-administered encounter medications on file as of 06/13/2022.    Past Medical History:  Diagnosis Date   Asthma    Constipation     No past surgical history on file.  Family History  Problem Relation Age of Onset   Lupus Mother    Asthma Mother    Irritable bowel syndrome Mother    Healthy Father    Autoimmune disease Sister     Asthma Sister    ADD / ADHD Sister    ADD / ADHD Sister    Asthma Sister    Asthma Brother    Sickle cell anemia Maternal Grandmother    Diabetes Maternal Grandfather    Prostate cancer Paternal Grandfather    Diabetes Maternal Aunt    Lupus Paternal Aunt    Fibromyalgia Paternal Aunt    Colon cancer Maternal Great-grandfather    Breast cancer Maternal Great-grandmother    Esophageal cancer Neg Hx    Stomach cancer Neg Hx    Pancreatic cancer Neg Hx    Colon polyps Neg Hx     Social History   Socioeconomic History   Marital status: Significant Other    Spouse name: Not on file   Number of children: Not on file   Years of education: Not on file   Highest education level: Not on file  Occupational History   Occupation: Doctor, general practice: [employer]  Tobacco Use   Smoking status: Never    Passive exposure: Never   Smokeless tobacco: Never  Vaping Use   Vaping Use: Former  Substance and Sexual Activity   Alcohol use: Yes    Comment: occasionally   Drug use: No   Sexual activity: Yes    Birth control/protection: Pill, OCP  Other Topics Concern   Not on file  Social History Narrative   Not  on file   Social Determinants of Health   Financial Resource Strain: Not on file  Food Insecurity: Not on file  Transportation Needs: Not on file  Physical Activity: Not on file  Stress: Not on file  Social Connections: Not on file  Intimate Partner Violence: Not on file    Review of Systems  All other systems reviewed and are negative.       Objective    BP 108/71   Pulse 73   Temp 98 F (36.7 C) (Oral)   Resp 16   Ht 5' (1.524 m)   Wt 104 lb 8 oz (47.4 kg)   SpO2 98%   BMI 20.41 kg/m   Physical Exam Vitals and nursing note reviewed.  Constitutional:      General: She is not in acute distress. HENT:     Head: Normocephalic and atraumatic.     Right Ear: Tympanic membrane, ear canal and external ear normal.     Left Ear: Tympanic membrane,  ear canal and external ear normal.     Nose: Nose normal.     Mouth/Throat:     Mouth: Mucous membranes are moist.     Pharynx: Oropharynx is clear.  Eyes:     Conjunctiva/sclera: Conjunctivae normal.     Pupils: Pupils are equal, round, and reactive to light.  Neck:     Thyroid: No thyromegaly.  Cardiovascular:     Rate and Rhythm: Normal rate and regular rhythm.     Heart sounds: Normal heart sounds. No murmur heard. Pulmonary:     Effort: Pulmonary effort is normal. No respiratory distress.     Breath sounds: Normal breath sounds.  Abdominal:     General: There is no distension.     Palpations: Abdomen is soft. There is no mass.     Tenderness: There is no abdominal tenderness.  Musculoskeletal:        General: Normal range of motion.     Cervical back: Normal range of motion and neck supple.  Skin:    General: Skin is warm and dry.  Neurological:     General: No focal deficit present.     Mental Status: She is alert and oriented to person, place, and time.  Psychiatric:        Mood and Affect: Mood normal.        Behavior: Behavior normal.         Assessment & Plan:   1. Annual physical exam  - CMP14+EGFR  2. Screening for lipid disorders  - Lipid Panel  3. Screening for deficiency anemia  - CBC with Differential  4. Screening for endocrine/metabolic/immunity disorders  - TSH    No follow-ups on file.   Becky Sax, MD

## 2022-06-15 ENCOUNTER — Ambulatory Visit (INDEPENDENT_AMBULATORY_CARE_PROVIDER_SITE_OTHER): Payer: No Typology Code available for payment source | Admitting: Podiatry

## 2022-06-15 ENCOUNTER — Ambulatory Visit (INDEPENDENT_AMBULATORY_CARE_PROVIDER_SITE_OTHER): Payer: No Typology Code available for payment source

## 2022-06-15 DIAGNOSIS — M779 Enthesopathy, unspecified: Secondary | ICD-10-CM

## 2022-06-15 DIAGNOSIS — R609 Edema, unspecified: Secondary | ICD-10-CM | POA: Diagnosis not present

## 2022-06-15 DIAGNOSIS — M7751 Other enthesopathy of right foot: Secondary | ICD-10-CM | POA: Diagnosis not present

## 2022-06-15 MED ORDER — DICLOFENAC SODIUM 75 MG PO TBEC: 75.0000 mg | DELAYED_RELEASE_TABLET | Freq: Two times a day (BID) | ORAL | 2 refills | Status: DC

## 2022-06-15 MED ORDER — TRIAMCINOLONE ACETONIDE 10 MG/ML IJ SUSP
10.0000 mg | Freq: Once | INTRAMUSCULAR | Status: AC
  Administered 2022-06-15: 10 mg

## 2022-06-16 NOTE — Progress Notes (Signed)
Subjective:   Patient ID: Sandra Vance, female   DOB: 21 y.o.   MRN: [id number]   HPI Patient presents with mother with a lot of pain in her right ankle and some edema.  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  Patient does not smoke likes to be active states this has been present for a while and worse over the last few weeks.     Review of Systems  All other systems reviewed and are negative.       Objective:  Physical Exam Vitals and nursing note reviewed.  Constitutional:      Appearance: She is well-developed.  Pulmonary:     Effort: Pulmonary effort is normal.  Musculoskeletal:        General: Normal range of motion.  Skin:    General: Skin is warm.  Neurological:     Mental Status: She is alert.     Neurovascular status intact muscle strength adequate range of motion within normal limits.  Patient is noted to have discomfort that is extending into the right sinus tarsi with inflammation fluid in this area and is noted to have significant flatfoot formerly which is probably creating stress on the area mild swelling.  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  I did sterile prep injected the sinus tarsi right 3 mg Dexasone Kenalog 5 mg Xylocaine applied fascial brace to lift up the arch gave instructions on shoe gear of good arch and discussed long-term orthotics.  Reappoint to recheck again in 3 weeks or earlier if needed  X-rays indicate significant flattening of the arch with no indications of arthritis of the subtalar joint or ankle

## 2022-06-20 ENCOUNTER — Other Ambulatory Visit: Payer: Self-pay | Admitting: Podiatry

## 2022-06-20 DIAGNOSIS — M779 Enthesopathy, unspecified: Secondary | ICD-10-CM

## 2022-06-29 ENCOUNTER — Other Ambulatory Visit: Payer: Self-pay | Admitting: *Deleted

## 2022-06-29 DIAGNOSIS — R7989 Other specified abnormal findings of blood chemistry: Secondary | ICD-10-CM

## 2022-07-03 NOTE — Progress Notes (Deleted)
Office Visit Note  Patient: Sandra Vance             Date of Birth: 2001/02/14           MRN: [id number]             PCP: Georganna Skeans, MD Referring: Georganna Skeans, MD Visit Date: 07/11/2022   Subjective:  No chief complaint on file.   History of Present Illness: Sandra Vance is a 21 y.o. 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 Recommended starting HCQ after last visit also vitamin D supplementation, skin UV protection. She feels the daily fatigue is somewhat improved after starting HCQ. She still has joint pain in both hands and feet and some in left knee. Pain is often worse by the end of the day and has increased swelling. She is concerned about some progression joint abnormalities at the right hand. She notices increase in foamy appearing urine, not sure exactly when this started or increased.    Previous HPI 02/07/22 Sandra Vance is a 21 y.o. female here for follow up for positive ANA associated with joint pains, rashes, lymphadenopathy, and fatigue. Findings were nonspecific at initial visit. She continues with symptoms of intermittent hand swelling and stiffness. Her feet also with swelling and discoloration intermittently. Skin rashes on the face and neck with some redness and some hyperpigmentation on the neck area. She feels itching and skin irritation with sun exposure but no visible rashes. More recently she went to the ED after developing severe back and flank pain found to have pyelonephritis treated with antibiotics and symptoms resolved. Subsequently in January developed severe diarrhea and visited the ED treated with antiemetics and pain controlled improved without specific cause identified. Since that time she has persistent constipation. 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   Previous HPI 04/07/21 Sandra Vance is a 21 y.o. female here for evaluation of positive ANA and symptoms with fatigue, arthralgia, swelling, GI symptoms with weight loss, urinary frequency, and alopecia. She was previously evaluated by pediatric rheumatology without active autoimmune disease identified associated with significantly positive ANA titer. Her joint pains are chronic since years ago but has noticed more recently some worsening with pain particularly in bilateral hands, knees, and feet. She notices pain and sometimes swelling more often in her right thumb and in the bilateral feet extending up to the ankles. She often notices this after a day of working. She takes ibuprofen sometimes as needed with a benefit. She reports skin rash on the back of her neck with an area of erythema and hair thinning identified in PCP office as eczema. She also notices intermittent rash on her arms bilaterally she describes as small red spots lasting for up to several days. These seem to be sensitive to heat and do not leave residual discoloration. She has chronic GI problems with early satiety, nausea and vomiting with eating full meals, inability to gain any weight. This was evaluated with gastroenterology years ago without any specific diagnosis or treatment apparently. Urinary frequency is ongoing since around April. She reports having to urinate about 5 times at night with small amounts of volume. She has had microscopic hematuria on urine samples and suspicion for kidney stones, with some flank pain  previously but this pain improved without change in hematuria and frequency.    Labs reviewed ANA 1:80 speckled RF neg CCP neg   02/2016 ANA 1:640 speckled SSA neg SSB neg dsDNA neg Smith neg Complements C3, C4 wnl   No Rheumatology ROS completed.   PMFS History:  Patient Active Problem List   Diagnosis Date Noted   Raynaud's syndrome  without gangrene 04/09/2022   Upper back pain 02/07/2022   Pyelonephritis 06/08/2021   Asthma in adult, mild intermittent, uncomplicated 06/08/2021   Positive ANA (antinuclear antibody) 04/07/2021   Arthralgia 04/07/2021   Weight loss 04/07/2021   Lymphadenopathy of head and neck 04/07/2021   Rash 04/07/2021   Urinary frequency 04/07/2021    Past Medical History:  Diagnosis Date   Asthma    Constipation     Family History  Problem Relation Age of Onset   Lupus Mother    Asthma Mother    Irritable bowel syndrome Mother    Healthy Father    Autoimmune disease Sister    Asthma Sister    ADD / ADHD Sister    ADD / ADHD Sister    Asthma Sister    Asthma Brother    Sickle cell anemia Maternal Grandmother    Diabetes Maternal Grandfather    Prostate cancer Paternal Grandfather    Diabetes Maternal Aunt    Lupus Paternal Aunt    Fibromyalgia Paternal Aunt    Colon cancer Maternal Great-grandfather    Breast cancer Maternal Great-grandmother    Esophageal cancer Neg Hx    Stomach cancer Neg Hx    Pancreatic cancer Neg Hx    Colon polyps Neg Hx    No past surgical history on file. Social History   Social History Narrative   Not on file   Immunization History  Administered Date(s) Administered   Influenza,inj,Quad PF,6-35 Mos 09/18/2021   Tdap 10/26/2020     Objective: Vital Signs: There were no vitals taken for this visit.   Physical Exam   Musculoskeletal Exam: ***  CDAI Exam: CDAI Score: -- Patient Global: --; Provider Global: -- Swollen: --; Tender: -- Joint Exam 07/11/2022   No joint exam has been documented for this visit   There is currently no information documented on the homunculus. Go to the Rheumatology activity and complete the homunculus joint exam.  Investigation: No additional findings.  Imaging: DG Foot 2 Views Right  Result Date: 06/20/2022 Please see detailed radiograph report in office note.   Recent Labs: Lab Results  Component  Value Date   WBC 5.2 06/13/2022   HGB 13.6 06/13/2022   PLT 293 06/13/2022   NA 143 06/13/2022   K 4.0 06/13/2022   CL 106 06/13/2022   CO2 18 (L) 06/13/2022   GLUCOSE 113 (H) 06/13/2022   BUN 12 06/13/2022   CREATININE 0.83 06/13/2022   BILITOT 0.5 06/13/2022   ALKPHOS 65 06/13/2022   AST 14 06/13/2022   ALT 12 06/13/2022   PROT 7.0 06/13/2022   ALBUMIN 4.6 06/13/2022   CALCIUM 9.5 06/13/2022   GFRAA NOT CALCULATED 05/13/2017    Speciality Comments: No specialty comments available.  Procedures:  No procedures performed Allergies: Shellfish-derived products and Raspberry   Assessment / Plan:     Visit Diagnoses: No diagnosis found.  ***  Orders: No orders of the defined types were placed in this encounter.  No orders of the defined types were placed in this encounter.    Follow-Up Instructions: No follow-ups on file.  Bertram Savin, RT  Note - This record has been created using Editor, commissioning.  Chart creation errors have been sought, but may not always  have been located. Such creation errors do not reflect on  the standard of medical care.

## 2022-07-07 ENCOUNTER — Other Ambulatory Visit: Payer: Self-pay | Admitting: Internal Medicine

## 2022-07-07 DIAGNOSIS — M549 Dorsalgia, unspecified: Secondary | ICD-10-CM

## 2022-07-09 ENCOUNTER — Ambulatory Visit: Payer: No Typology Code available for payment source | Admitting: Podiatry

## 2022-07-09 ENCOUNTER — Encounter: Payer: Self-pay | Admitting: Family Medicine

## 2022-07-09 ENCOUNTER — Other Ambulatory Visit: Payer: Self-pay | Admitting: Family Medicine

## 2022-07-09 ENCOUNTER — Ambulatory Visit (INDEPENDENT_AMBULATORY_CARE_PROVIDER_SITE_OTHER): Payer: No Typology Code available for payment source | Admitting: Family Medicine

## 2022-07-09 ENCOUNTER — Encounter: Payer: Self-pay | Admitting: Podiatry

## 2022-07-09 VITALS — BP 115/81 | HR 83 | Temp 98.1°F | Resp 16 | Wt 106.0 lb

## 2022-07-09 DIAGNOSIS — M7751 Other enthesopathy of right foot: Secondary | ICD-10-CM

## 2022-07-09 DIAGNOSIS — R21 Rash and other nonspecific skin eruption: Secondary | ICD-10-CM

## 2022-07-09 DIAGNOSIS — R768 Other specified abnormal immunological findings in serum: Secondary | ICD-10-CM

## 2022-07-09 DIAGNOSIS — M779 Enthesopathy, unspecified: Secondary | ICD-10-CM | POA: Diagnosis not present

## 2022-07-09 DIAGNOSIS — I7300 Raynaud's syndrome without gangrene: Secondary | ICD-10-CM | POA: Diagnosis not present

## 2022-07-09 DIAGNOSIS — M2550 Pain in unspecified joint: Secondary | ICD-10-CM | POA: Diagnosis not present

## 2022-07-09 DIAGNOSIS — R7989 Other specified abnormal findings of blood chemistry: Secondary | ICD-10-CM | POA: Diagnosis not present

## 2022-07-09 DIAGNOSIS — R609 Edema, unspecified: Secondary | ICD-10-CM | POA: Diagnosis not present

## 2022-07-09 LAB — T4, FREE: Free T4: 1.48 ng/dL (ref 0.82–1.77)

## 2022-07-09 LAB — T3, FREE: T3, Free: 3.5 pg/mL (ref 2.0–4.4)

## 2022-07-09 LAB — TSH: TSH: 0.751 u[IU]/mL (ref 0.450–4.500)

## 2022-07-09 MED ORDER — TRIAMCINOLONE ACETONIDE 0.1 % EX CREA: 1.0000 | TOPICAL_CREAM | Freq: Two times a day (BID) | CUTANEOUS | 1 refills | Status: DC

## 2022-07-09 NOTE — Progress Notes (Signed)
Patient is her to request another referral to a rheumatologist. Patient is not happy with present doctor. Patient will be sent to [place] for labs

## 2022-07-09 NOTE — Progress Notes (Signed)
Established Patient Office Visit  Subjective    Patient ID: Sandra Vance, female    DOB: 04-Mar-2001  Age: 21 y.o. MRN: [id number]  CC:  Chief Complaint  Patient presents with   Follow-up    HPI Sandra Vance presents sd she would like another rheumatologist to evaluate her sx. Her sx persist. She also has intermittent rash.    Outpatient Encounter Medications as of 07/09/2022  Medication Sig   AVIANE 0.1-20 MG-MCG tablet Take 1 tablet by mouth daily.   cyclobenzaprine (FLEXERIL) 5 MG tablet Take 1 tablet (5 mg total) by mouth at bedtime as needed for muscle spasms.   diclofenac (VOLTAREN) 75 MG EC tablet Take 1 tablet (75 mg total) by mouth 2 (two) times daily.   hydrocortisone (ANUSOL-HC) 2.5 % rectal cream Place 1 application. rectally 2 (two) times daily as needed for hemorrhoids.   hydroxychloroquine (PLAQUENIL) 200 MG tablet Take 1 tablet (200 mg total) by mouth daily.   linaclotide (LINZESS) 290 MCG CAPS capsule Take 1 capsule (290 mcg total) by mouth daily before breakfast.   metroNIDAZOLE (FLAGYL) 500 MG tablet Take 1 tablet (500 mg total) by mouth 2 (two) times daily.   norgestimate-ethinyl estradiol (ORTHO-CYCLEN) 0.25-35 MG-MCG tablet Take 1 tablet by mouth daily.   omeprazole (PRILOSEC) 40 MG capsule Take 1 capsule (40 mg total) by mouth daily.   polyethylene glycol (MIRALAX / GLYCOLAX) 17 g packet Take 17 g by mouth 2 (two) times daily. Increase as needed   Rectal Protectant-Emollient (CALMOL-4) 76-10 % SUPP Use as directed once to twice daily   triamcinolone cream (KENALOG) 0.1 % Apply 1 Application topically 2 (two) times daily.   No facility-administered encounter medications on file as of 07/09/2022.    Past Medical History:  Diagnosis Date   Asthma    Constipation     No past surgical history on file.  Family History  Problem Relation Age of Onset   Lupus Mother    Asthma Mother    Irritable bowel syndrome Mother    Healthy Father    Autoimmune  disease Sister    Asthma Sister    ADD / ADHD Sister    ADD / ADHD Sister    Asthma Sister    Asthma Brother    Sickle cell anemia Maternal Grandmother    Diabetes Maternal Grandfather    Prostate cancer Paternal Grandfather    Diabetes Maternal Aunt    Lupus Paternal Aunt    Fibromyalgia Paternal Aunt    Colon cancer Maternal Great-grandfather    Breast cancer Maternal Great-grandmother    Esophageal cancer Neg Hx    Stomach cancer Neg Hx    Pancreatic cancer Neg Hx    Colon polyps Neg Hx     Social History   Socioeconomic History   Marital status: Significant Other    Spouse name: Not on file   Number of children: Not on file   Years of education: Not on file   Highest education level: Not on file  Occupational History   Occupation: Web designer: [employer]  Tobacco Use   Smoking status: Never    Passive exposure: Never   Smokeless tobacco: Never  Vaping Use   Vaping Use: Former  Substance and Sexual Activity   Alcohol use: Yes    Comment: occasionally   Drug use: No   Sexual activity: Yes    Birth control/protection: Pill, OCP  Other Topics Concern   Not  on file  Social History Narrative   Not on file   Social Determinants of Health   Financial Resource Strain: Not on file  Food Insecurity: Not on file  Transportation Needs: Not on file  Physical Activity: Not on file  Stress: Not on file  Social Connections: Not on file  Intimate Partner Violence: Not on file    Review of Systems  All other systems reviewed and are negative.       Objective    BP 115/81   Pulse 83   Temp 98.1 F (36.7 C) (Oral)   Resp 16   Wt 106 lb (48.1 kg)   SpO2 99%   BMI 20.70 kg/m   Physical Exam Vitals and nursing note reviewed.  Constitutional:      General: She is not in acute distress. Cardiovascular:     Rate and Rhythm: Normal rate and regular rhythm.  Pulmonary:     Effort: Pulmonary effort is normal.     Breath sounds: Normal  breath sounds.  Musculoskeletal:        General: Tenderness (multiple joints) present.  Skin:    Findings: No rash.  Neurological:     General: No focal deficit present.     Mental Status: She is alert and oriented to person, place, and time.         Assessment & Plan:   1. Raynaud's syndrome without gangrene Referral to rheumatology for further eval/mgt - Ambulatory referral to Rheumatology  2. Positive ANA (antinuclear antibody) As above - Ambulatory referral to Rheumatology  3. Arthralgia, unspecified joint As above - Ambulatory referral to Rheumatology  4. Abnormal serum thyroxine (T4) level Monitoring labs ordered - TSH+T4F+T3Free  5. Rash and nonspecific skin eruption Triamcinolone cream prescribed    No follow-ups on file.   Becky Sax, MD

## 2022-07-09 NOTE — Telephone Encounter (Signed)
Next Visit: 07/11/2022  Last Visit: 04/09/2022  Last Fill: 02/07/2022  Dx: Arthralgia  Current Dose per office note on 04/09/2022: Dosage not noted  Okay to refill Flexeril?

## 2022-07-11 ENCOUNTER — Ambulatory Visit: Payer: No Typology Code available for payment source | Admitting: Internal Medicine

## 2022-07-11 DIAGNOSIS — R768 Other specified abnormal immunological findings in serum: Secondary | ICD-10-CM

## 2022-07-11 DIAGNOSIS — Z79899 Other long term (current) drug therapy: Secondary | ICD-10-CM

## 2022-07-11 DIAGNOSIS — I7300 Raynaud's syndrome without gangrene: Secondary | ICD-10-CM

## 2022-07-11 DIAGNOSIS — M2550 Pain in unspecified joint: Secondary | ICD-10-CM

## 2022-07-11 DIAGNOSIS — R350 Frequency of micturition: Secondary | ICD-10-CM

## 2022-07-11 NOTE — Progress Notes (Signed)
Subjective:   Patient ID: Sandra Vance, female   DOB: 21 y.o.   MRN: [id number]   HPI Patient states that she has had improvement but she still gets pain in her foot and that this has been ongoing with the fact that she walks on her feet with work at all times   ROS      Objective:  Physical Exam  Neuro vascular status intact with discomfort in the forefoot and into the midfoot generalized and into the arch with the ankle doing better that I had worked on previously     Assessment:  Patient has foot structural issues with depressed arches bilateral family history history of orthotics with moderate chronic foot pain and improved ankle pain     Plan:  H&P reviewed condition went ahead today recommended long-term orthotics and casted for functional orthotic devices.  Patient will be seen back for [place] to recheck when returned and gave instructions for anti-inflammatories as needed

## 2022-07-12 ENCOUNTER — Other Ambulatory Visit (HOSPITAL_COMMUNITY): Payer: Self-pay

## 2022-07-12 MED ORDER — CYCLOBENZAPRINE HCL 5 MG PO TABS
5.0000 mg | ORAL_TABLET | Freq: Every evening | ORAL | 0 refills | Status: DC | PRN
  Filled 2022-07-12: qty 30, 30d supply, fill #0

## 2022-07-12 NOTE — Telephone Encounter (Signed)
Intended to address at clinic f/u which was scheduled yesterday but patient cancelled. She has been referred to transfer care to [facility] rheumatology

## 2022-07-13 ENCOUNTER — Other Ambulatory Visit (HOSPITAL_COMMUNITY): Payer: Self-pay

## 2022-07-21 ENCOUNTER — Other Ambulatory Visit (HOSPITAL_COMMUNITY): Payer: Self-pay

## 2022-07-24 ENCOUNTER — Other Ambulatory Visit: Payer: Self-pay

## 2022-07-24 ENCOUNTER — Emergency Department (HOSPITAL_COMMUNITY)
Admission: EM | Admit: 2022-07-24 | Discharge: 2022-07-24 | Disposition: A | Discharge status: Home / Self Care | Payer: No Typology Code available for payment source | Attending: Emergency Medicine | Admitting: Emergency Medicine

## 2022-07-24 ENCOUNTER — Encounter (HOSPITAL_COMMUNITY): Payer: Self-pay

## 2022-07-24 ENCOUNTER — Emergency Department (HOSPITAL_COMMUNITY): Payer: No Typology Code available for payment source

## 2022-07-24 DIAGNOSIS — R0789 Other chest pain: Secondary | ICD-10-CM | POA: Insufficient documentation

## 2022-07-24 DIAGNOSIS — M549 Dorsalgia, unspecified: Secondary | ICD-10-CM | POA: Insufficient documentation

## 2022-07-24 DIAGNOSIS — R0602 Shortness of breath: Secondary | ICD-10-CM | POA: Diagnosis not present

## 2022-07-24 DIAGNOSIS — R079 Chest pain, unspecified: Secondary | ICD-10-CM

## 2022-07-24 DIAGNOSIS — Z20822 Contact with and (suspected) exposure to covid-19: Secondary | ICD-10-CM | POA: Insufficient documentation

## 2022-07-24 LAB — D-DIMER, QUANTITATIVE: D-Dimer, Quant: <0.27 ug/mL-FEU (ref 0.00–0.50)

## 2022-07-24 LAB — I-STAT BETA HCG BLOOD, ED (MC, WL, AP ONLY): I-stat hCG, quantitative: <5 m[IU]/mL (ref <5)

## 2022-07-24 LAB — RESP PANEL BY RT-PCR (FLU A&B, COVID) ARPGX2
Influenza A by PCR: NEGATIVE
Influenza B by PCR: NEGATIVE
SARS Coronavirus 2 by RT PCR: NEGATIVE

## 2022-07-24 LAB — BASIC METABOLIC PANEL
Anion gap: 5 (ref 5–15)
BUN: 17 mg/dL (ref 6–20)
CO2: 24 mmol/L (ref 22–32)
Calcium: 9.1 mg/dL (ref 8.9–10.3)
Chloride: 110 mmol/L (ref 98–111)
Creatinine, Ser: 0.74 mg/dL (ref 0.44–1.00)
GFR, Estimated: >60 mL/min (ref >60)
Glucose, Bld: 85 mg/dL (ref 70–99)
Potassium: 4.2 mmol/L (ref 3.5–5.1)
Sodium: 139 mmol/L (ref 135–145)

## 2022-07-24 LAB — TROPONIN I (HIGH SENSITIVITY)
Troponin I (High Sensitivity): <2 ng/L (ref <18)
Troponin I (High Sensitivity): <2 ng/L (ref <18)

## 2022-07-24 LAB — CBC
HCT: 38.6 % (ref 36.0–46.0)
Hemoglobin: 13.8 g/dL (ref 12.0–15.0)
MCH: 28.6 pg (ref 26.0–34.0)
MCHC: 35.8 g/dL (ref 30.0–36.0)
MCV: 80.1 fL (ref 80.0–100.0)
Platelets: 257 10*3/uL (ref 150–400)
RBC: 4.82 MIL/uL (ref 3.87–5.11)
RDW: 12.4 % (ref 11.5–15.5)
WBC: 5.7 10*3/uL (ref 4.0–10.5)
nRBC: 0 % (ref 0.0–0.2)

## 2022-07-24 NOTE — ED Provider Notes (Signed)
[facility] DEPT Provider Note   CSN: [id number] Arrival date & time: 07/24/22  7564     History  Chief Complaint  Patient presents with   Shortness of Breath   Chest Pain   Back Pain    Sandra Vance is a 21 y.o. female.  Patient is a 21 year old female on Plaquenil for an unknown autoimmune disorder as well as OCPs presenting to the emergency department with chest pain and shortness of breath.  The patient states that she was at work today standing up when she started to feel a diffuse chest pressure associated with shortness of breath.  She states that the pain and the shortness of breath lasted for about 1 hour.  She states she had associated lightheadedness.  She denied any nausea or vomiting.  She denies any recent fevers or chills, cough or congestion, runny nose or sore throat.  She denies any lower extremity swelling.  She denies any history of blood clots, recent long travels or hospitalizations.  She states she thinks she may have a family history of early cardiac disease but denies any history of sudden unexplained death.  States that she did not take anything for her pain and that it resolved on its own.  She states she has never had this pain before.  She denies any tobacco or drug use and only reports occasional alcohol.  Initially reported low back pain to triage but reports to me that she always has this pain and it has not been changed for worsening today.  The history is provided by the patient.  Shortness of Breath Associated symptoms: chest pain   Chest Pain Associated symptoms: back pain and shortness of breath   Back Pain Associated symptoms: chest pain        Home Medications Prior to Admission medications   Medication Sig Start Date End Date Taking? Authorizing Provider  AVIANE 0.1-20 MG-MCG tablet Take 1 tablet by mouth daily. 05/30/22   [provider]  cyclobenzaprine (FLEXERIL) 5 MG tablet Take 1 tablet (5 mg  total) by mouth at bedtime as needed for muscle spasms. 07/12/22   Rice, Resa Miner, MD  diclofenac (VOLTAREN) 75 MG EC tablet Take 1 tablet (75 mg total) by mouth 2 (two) times daily. 06/15/22   Wallene Huh, DPM  hydrocortisone (ANUSOL-HC) 2.5 % rectal cream Place 1 application. rectally 2 (two) times daily as needed for hemorrhoids. 01/09/22   Vladimir Crofts, PA-C  hydroxychloroquine (PLAQUENIL) 200 MG tablet Take 1 tablet (200 mg total) by mouth daily. 02/07/22   Rice, Resa Miner, MD  linaclotide Rolan Lipa) 290 MCG CAPS capsule Take 1 capsule (290 mcg total) by mouth daily before breakfast. 02/28/22   Armbruster, Carlota Raspberry, MD  metroNIDAZOLE (FLAGYL) 500 MG tablet Take 1 tablet (500 mg total) by mouth 2 (two) times daily. 03/23/22   Shelly Bombard, MD  norgestimate-ethinyl estradiol (ORTHO-CYCLEN) 0.25-35 MG-MCG tablet Take 1 tablet by mouth daily.    [provider]  omeprazole (PRILOSEC) 40 MG capsule Take 1 capsule (40 mg total) by mouth daily. 02/28/22   Armbruster, Carlota Raspberry, MD  polyethylene glycol (MIRALAX / GLYCOLAX) 17 g packet Take 17 g by mouth 2 (two) times daily. Increase as needed    [provider]  Rectal Protectant-Emollient (CALMOL-4) 76-10 % SUPP Use as directed once to twice daily 02/28/22   Armbruster, Carlota Raspberry, MD  triamcinolone cream (KENALOG) 0.1 % Apply 1 Application topically 2 (two) times daily. 07/09/22  Dorna Mai, MD      Allergies    Shellfish-derived products and Raspberry    Review of Systems   Review of Systems  Respiratory:  Positive for shortness of breath.   Cardiovascular:  Positive for chest pain.  Musculoskeletal:  Positive for back pain.    Physical Exam Updated Vital Signs BP (!) 125/90 (BP Location: Right Arm)   Pulse 87   Temp 97.9 F (36.6 C) (Oral)   Resp 18   Ht 5' (1.524 m)   Wt 48.1 kg   LMP 07/17/2022   SpO2 100%   BMI 20.70 kg/m  Physical Exam Vitals and nursing note reviewed.  Constitutional:       General: She is not in acute distress.    Appearance: She is well-developed.  HENT:     Head: Normocephalic and atraumatic.  Eyes:     Extraocular Movements: Extraocular movements intact.  Cardiovascular:     Rate and Rhythm: Normal rate and regular rhythm.     Pulses: Normal pulses.     Heart sounds: Normal heart sounds.  Pulmonary:     Effort: Pulmonary effort is normal.     Breath sounds: Normal breath sounds.  Chest:     Chest wall: Tenderness (Mild diffuse) present.  Abdominal:     Palpations: Abdomen is soft.     Tenderness: There is no abdominal tenderness.  Musculoskeletal:        General: Normal range of motion.     Cervical back: Normal range of motion and neck supple.     Right lower leg: No edema.     Left lower leg: No edema.  Skin:    General: Skin is warm and dry.  Neurological:     General: No focal deficit present.     Mental Status: She is alert and oriented to person, place, and time.  Psychiatric:        Mood and Affect: Mood normal.        Behavior: Behavior normal.     ED Results / Procedures / Treatments   Labs (all labs ordered are listed, but only abnormal results are displayed) Labs Reviewed  RESP PANEL BY RT-PCR (FLU A&B, COVID) ARPGX2  BASIC METABOLIC PANEL  CBC  D-DIMER, QUANTITATIVE  I-STAT BETA HCG BLOOD, ED ([facility], [facility], [facility] ONLY)  TROPONIN I (HIGH SENSITIVITY)  TROPONIN I (HIGH SENSITIVITY)    EKG EKG Interpretation  Date/Time:  Tuesday July 24 2022 08:25:21 EDT Ventricular Rate:  110 PR Interval:  122 QRS Duration: 80 QT Interval:  336 QTC Calculation: 454 R Axis:   79 Text Interpretation: Sinus tachycardia Possible Anterior infarct , age undetermined Abnormal ECG When compared with ECG of 08-Jun-2021 03:40, PREVIOUS ECG IS PRESENT Confirmed by Regan Lemming ([id number]) on 07/24/2022 11:17:11 AM  Radiology DG Chest 2 View  Result Date: 07/24/2022 CLINICAL DATA:  Chest pain EXAM: CHEST - 2 VIEW COMPARISON:  06/08/2021 FINDINGS: The  heart size and mediastinal contours are within normal limits. Both lungs are clear. The visualized skeletal structures are unremarkable. IMPRESSION: Normal chest radiographs. Electronically Signed   By: Davina Poke D.O.   On: 07/24/2022 09:12    Procedures Procedures    Medications Ordered in ED Medications - No data to display  ED Course/ Medical Decision Making/ A&P           HEART Score: 1                Medical Decision Making This patient  presents to the ED with chief complaint(s) of chest pain, shortness of breath with pertinent past medical history of unknown autoimmune disease on Plaquenil which further complicates the presenting complaint. The complaint involves an extensive differential diagnosis and also carries with it a high risk of complications and morbidity.    The differential diagnosis includes ACS, arrhythmia, PE, pneumonia, pneumothorax, pulmonary edema, pericarditis, myocarditis, costochondritis, dissection unlikely as patient is pain-free with equal pulses in all 4 extremities and no neurologic symptoms  Additional history obtained: Additional history obtained from N/A Records reviewed Primary Care Documents  ED Course and Reassessment: She was initially evaluated by provider in triage and had labs, EKG and chest x-ray performed.  Labs showed negative troponin and EKG normal sinus rhythm without any evidence of pericarditis, ischemia or arrhythmia.  D-dimer is negative making PE unlikely.  Chest x-ray shows no acute disease.  Patient is currently pain-free.  She stable for discharge home with primary care follow-up and was given strict return precautions.  Independent labs interpretation:  The following labs were independently interpreted: Within normal range  Independent visualization of imaging: - I independently visualized the following imaging with scope of interpretation limited to determining acute life threatening conditions related to emergency care:  Chest X-ray, which revealed no acute disease  Consultation: - Consulted or discussed management/test interpretation w/ external professional: N/A  Consideration for admission or further workup: Patient has no emergent conditions requiring admission at this time and is stable for discharge with primary care follow-up Social Determinants of health: N/A    Amount and/or Complexity of Data Reviewed Labs: ordered. Radiology: ordered.          Final Clinical Impression(s) / ED Diagnoses Final diagnoses:  Chest pain, unspecified type    Rx / DC Orders ED Discharge Orders     None         Kemper Durie, DO 07/24/22 1744

## 2022-07-24 NOTE — ED Provider Triage Note (Signed)
Emergency Medicine Provider Triage Evaluation Note  Sandra Vance , a 21 y.o. female  was evaluated in triage.  Pt complains of diffuse back pain x2 days without known injury.  Additionally with chest pain started at 7 AM, no clear inciting event.  Reports associated dizziness and occasional shortness of breath with pleuritic chest pain.  Review of Systems  Positive: Chest pain, back pain, shortness of breath, OCP use Negative: Fever, hemoptysis, cough, abdominal pain, vomiting, diarrhea, extremity swelling/color change  Physical Exam  BP (!) 133/98 (BP Location: Left Arm)   Pulse 97   Temp 97.7 F (36.5 C)   Resp 18   Ht 5' (1.524 m)   Wt 48.1 kg   LMP 07/17/2022   SpO2 100%   BMI 20.70 kg/m  Gen:   Awake, no distress   Resp:  Normal effort lungs clear to auscultation MSK:   Moves extremities without difficulty, no lower extremity edema Other:  Heart regular rate and rhythm  Medical Decision Making  Medically screening exam initiated at 11:52 AM.  Appropriate orders placed.  Sandra Vance was informed that the remainder of the evaluation will be completed by another provider, this initial triage assessment does not replace that evaluation, and the importance of remaining in the ED until their evaluation is complete.   Note: Portions of this report may have been transcribed using voice recognition software. Every effort was made to ensure accuracy; however, inadvertent computerized transcription errors may still be present.    Deliah Boston, PA-C 07/24/22 1153

## 2022-07-24 NOTE — ED Triage Notes (Signed)
Patient reports that she began having chest pain, dizziness, lower back pain, and SOB since 0700 today.

## 2022-07-24 NOTE — Discharge Instructions (Signed)
You are seen in the emergency department for your chest pain.  You had no signs of heart attack, abnormal heart rhythm, blood clots, pneumonia, COVID or flu as causes of your chest pain.  It is unclear what caused your pain today.  You should follow-up with your primary doctor to have your symptoms rechecked.  You should return to the emergency department if you are having worsening chest pain that does not go away, worsening shortness of breath, you pass out, or if you have any other new or concerning symptoms.

## 2022-07-24 NOTE — ED Notes (Signed)
An After Visit Summary was printed and given to the patient. Discharge instructions given and no further questions at this time.  

## 2022-08-06 IMAGING — CT CT ABD-PELV W/ CM
2 of 4 series · 17 of 46 positions shown, 19 images · IV contrast (Omni 300)
Comparison: CT abdomen pelvis 02/25/2021

CLINICAL DATA: Pyelonephritis, concern for kidney stones and/or
abscess.

EXAM:
CT ABDOMEN AND PELVIS WITH CONTRAST
TECHNIQUE: Multidetector CT imaging of the abdomen and pelvis was performed
using the standard protocol following bolus administration of
intravenous contrast.
CONTRAST:  55mL OMNIPAQUE IOHEXOL 350 MG/ML SOLN

[Series 3: a/p w/ 5mm · axial · 0.74mm/px · z∈[+782,+1147]mm · 14 of 81 slices shown, 16 images]
[im 4/81  soft-tissue]
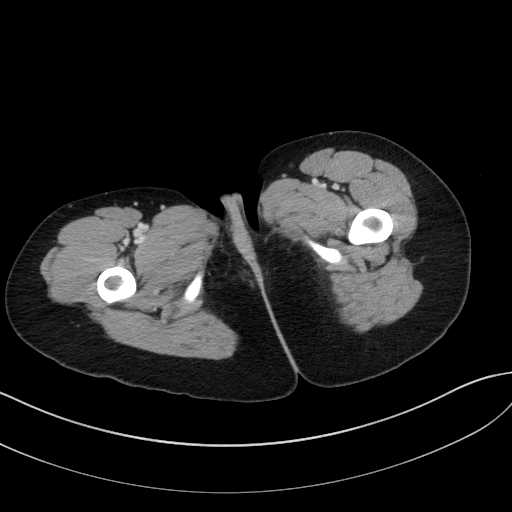
[im 4/81  bone]
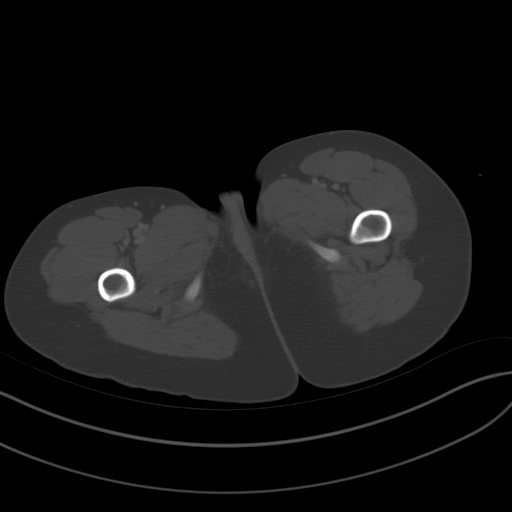
[im 11/81  soft-tissue]
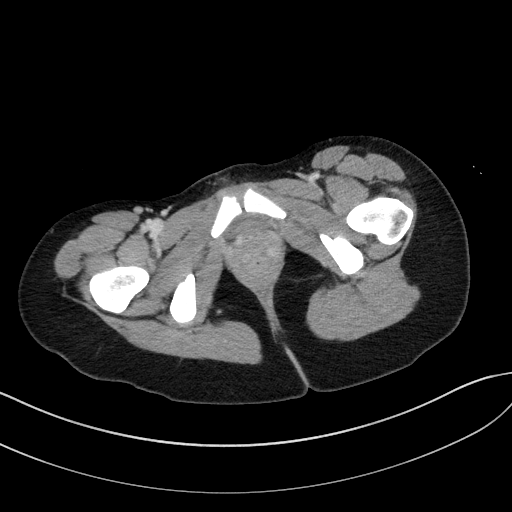
[im 14/81  soft-tissue]
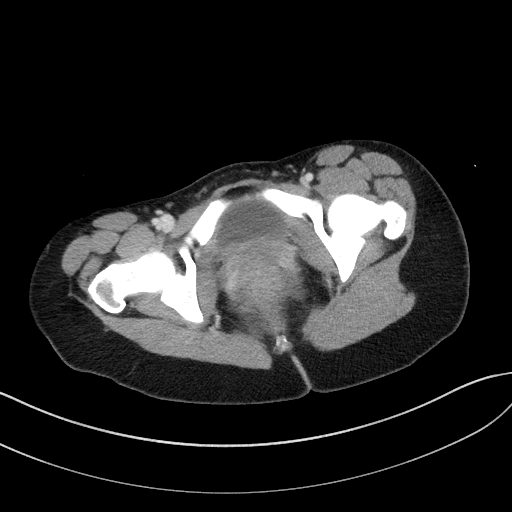
[im 21/81  soft-tissue]
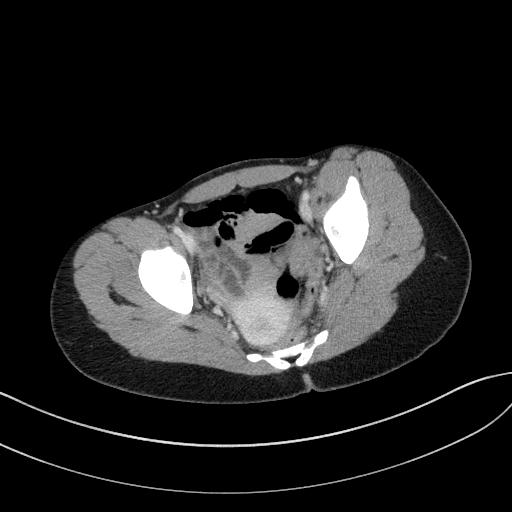
[im 28/81  soft-tissue]
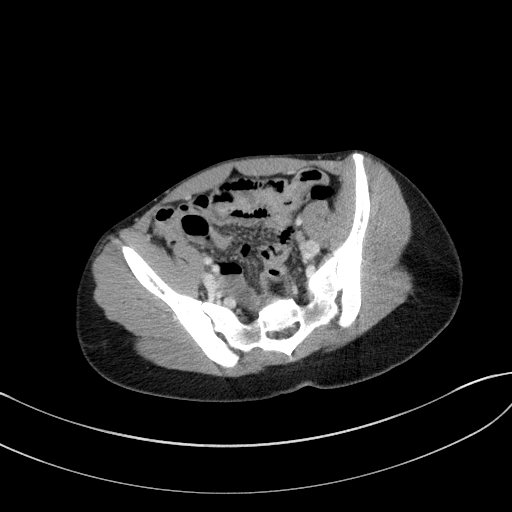
[im 32/81  soft-tissue]
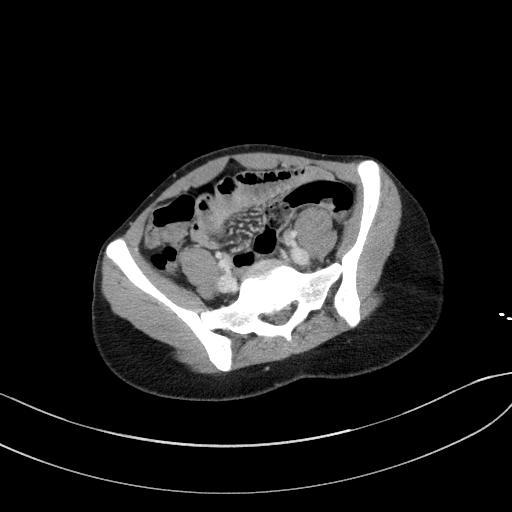
[im 39/81  soft-tissue]
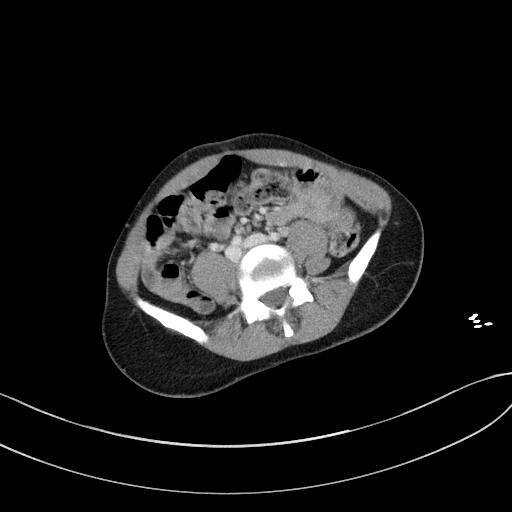
[im 42/81  soft-tissue]
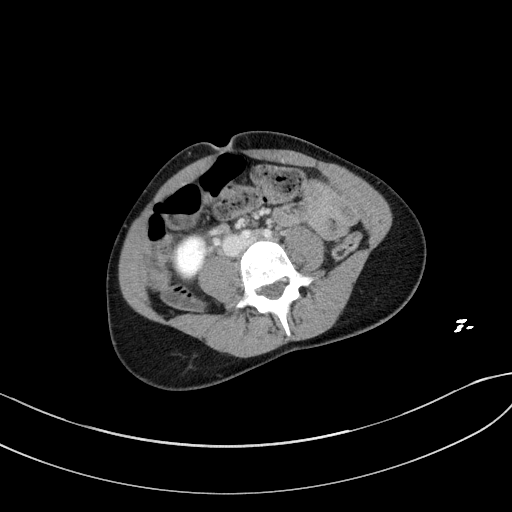
[im 49/81  soft-tissue]
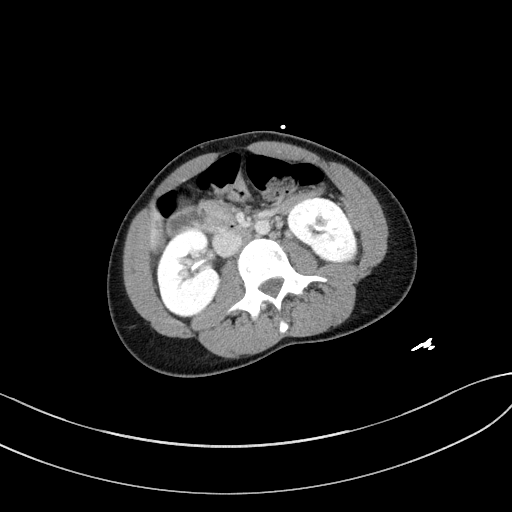
[im 49/81  bone]
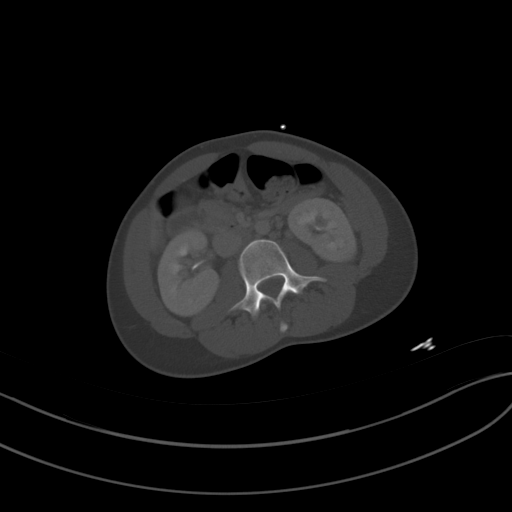
[im 53/81  soft-tissue]
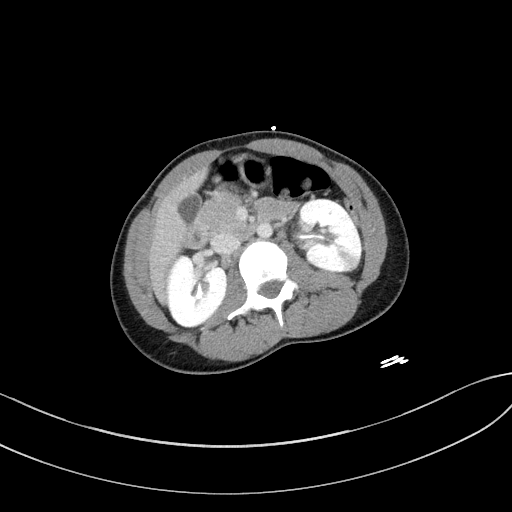
[im 60/81  soft-tissue]
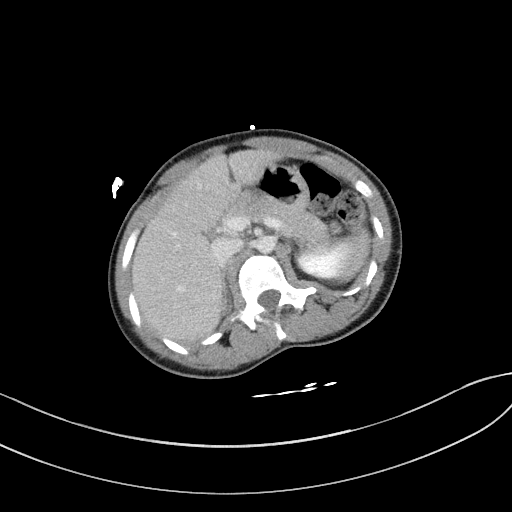
[im 67/81  soft-tissue]
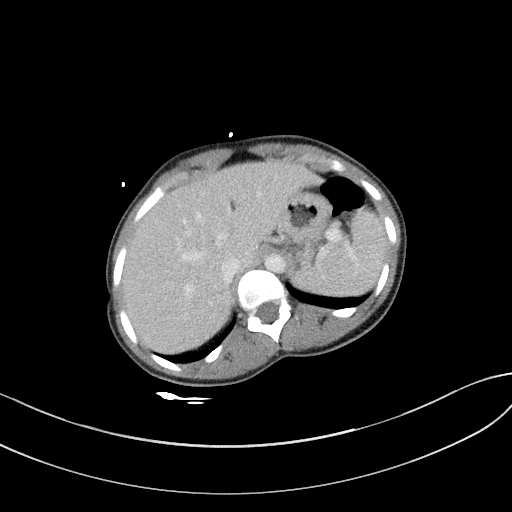
[im 70/81  soft-tissue]
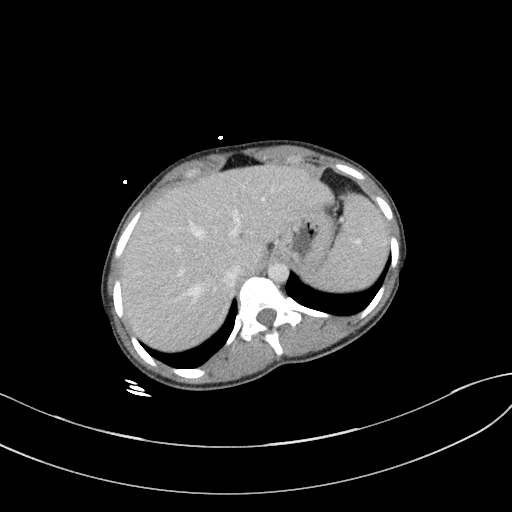
[im 77/81  soft-tissue]
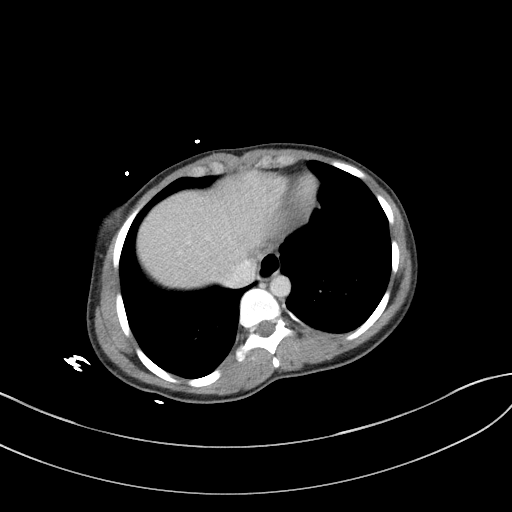

[Series 6: a/p w/ cor · coronal · 0.78mm/px · 3 of 99 slices shown]
[im 33/99  soft-tissue]
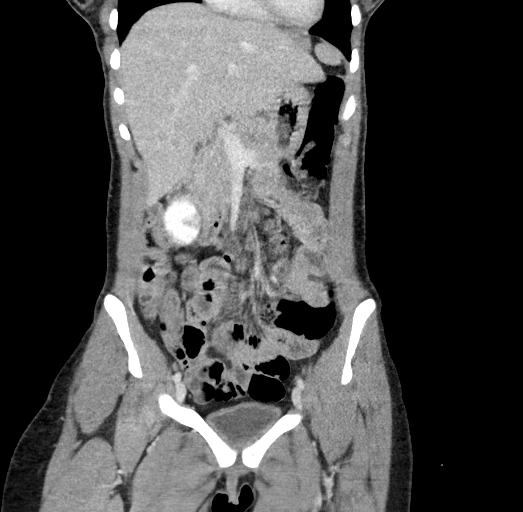
[im 44/99  soft-tissue]
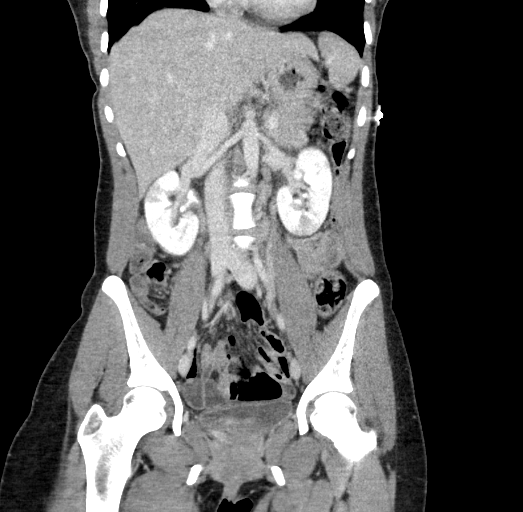
[im 55/99  soft-tissue]
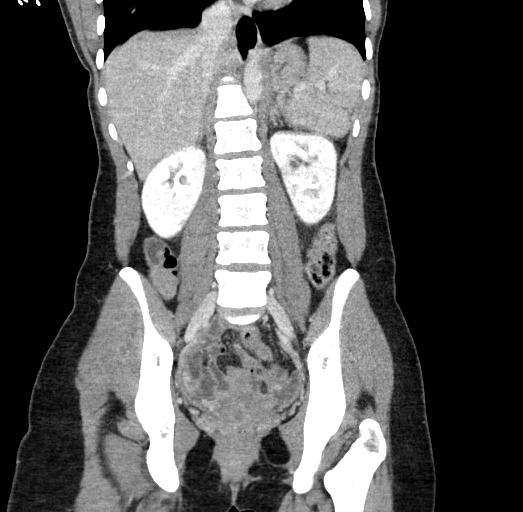

[17 of 46 positions shown; findings below may reference images not displayed]

FINDINGS: Lower chest: No acute abnormality.

Hepatobiliary: No focal liver abnormality is seen. No gallstones,
gallbladder wall thickening, or biliary dilatation.

Pancreas: Unremarkable. No pancreatic ductal dilatation or
surrounding inflammatory changes.

Spleen: Normal in size without focal abnormality.

Adrenals/Urinary Tract: Bilateral multifocal wedge-shaped areas of
hypoenhancement in the renal cortices (series 3, images 29-36).
There is no perinephric fat stranding or perinephric fluid
collection. No renal stone visualized in either renal collecting
system or urinary bladder. Urinary bladder is unremarkable.

Adrenal glands are unremarkable.

Stomach/Bowel: Stomach is within normal limits. No evidence of bowel
wall thickening, distention, or inflammatory changes. The appendix
is not directly visualized, however no inflammatory changes are
noted in the right lower quadrant.

Vascular/Lymphatic: No significant vascular findings are present. No
enlarged abdominal or pelvic lymph nodes.

Reproductive: Uterus and bilateral adnexa are unremarkable.

Other: No abdominal wall hernia or abnormality. No abdominopelvic
ascites.

Musculoskeletal: No acute or significant osseous findings.
IMPRESSION: Bilateral multifocal wedge-shaped areas of hypoenhancement in the
renal cortices, consistent with bilateral multifocal pyelonephritis.
No CT findings of renal or perinephric abscess. No renal stone in
either renal collecting system or urinary bladder.

## 2022-08-06 IMAGING — DX DG CHEST 2V
2 series · 2 of 2 positions shown · non-contrast
Comparison: Chest x-ray dated May 23, 2016.

CLINICAL DATA: Sepsis.

EXAM:
CHEST - 2 VIEW

[chest lat]
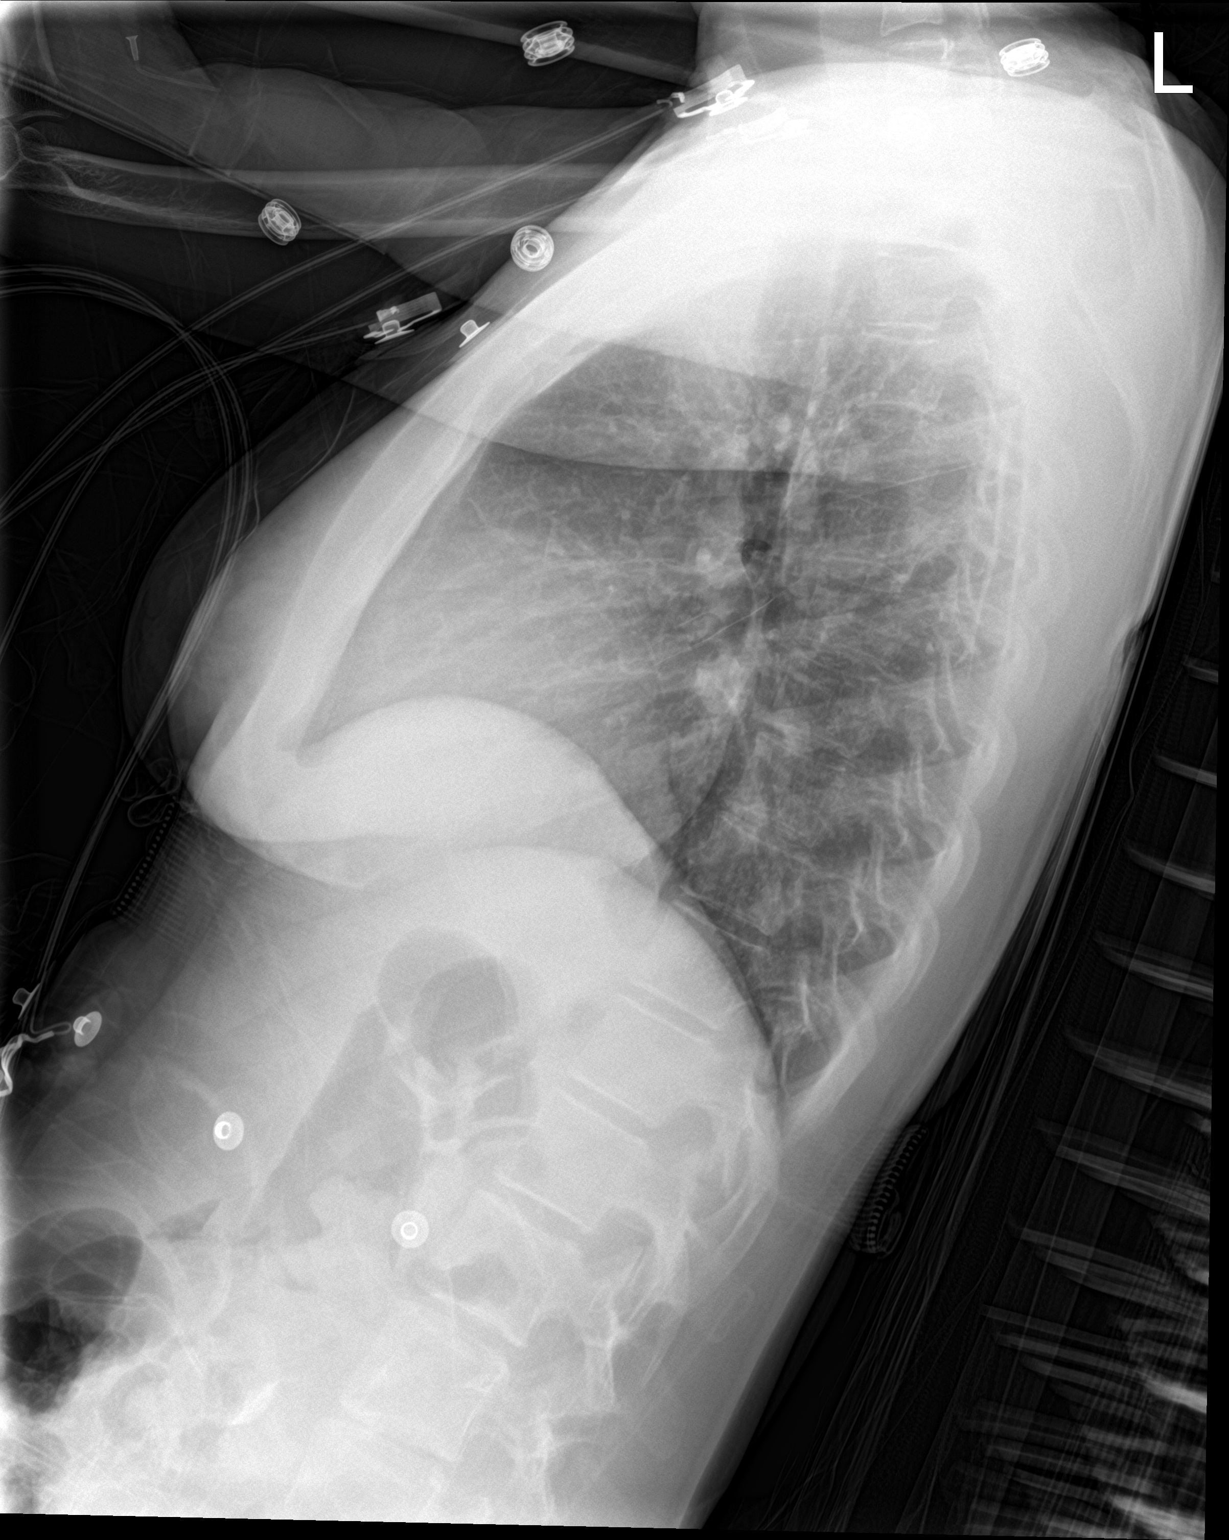

[chest ap]
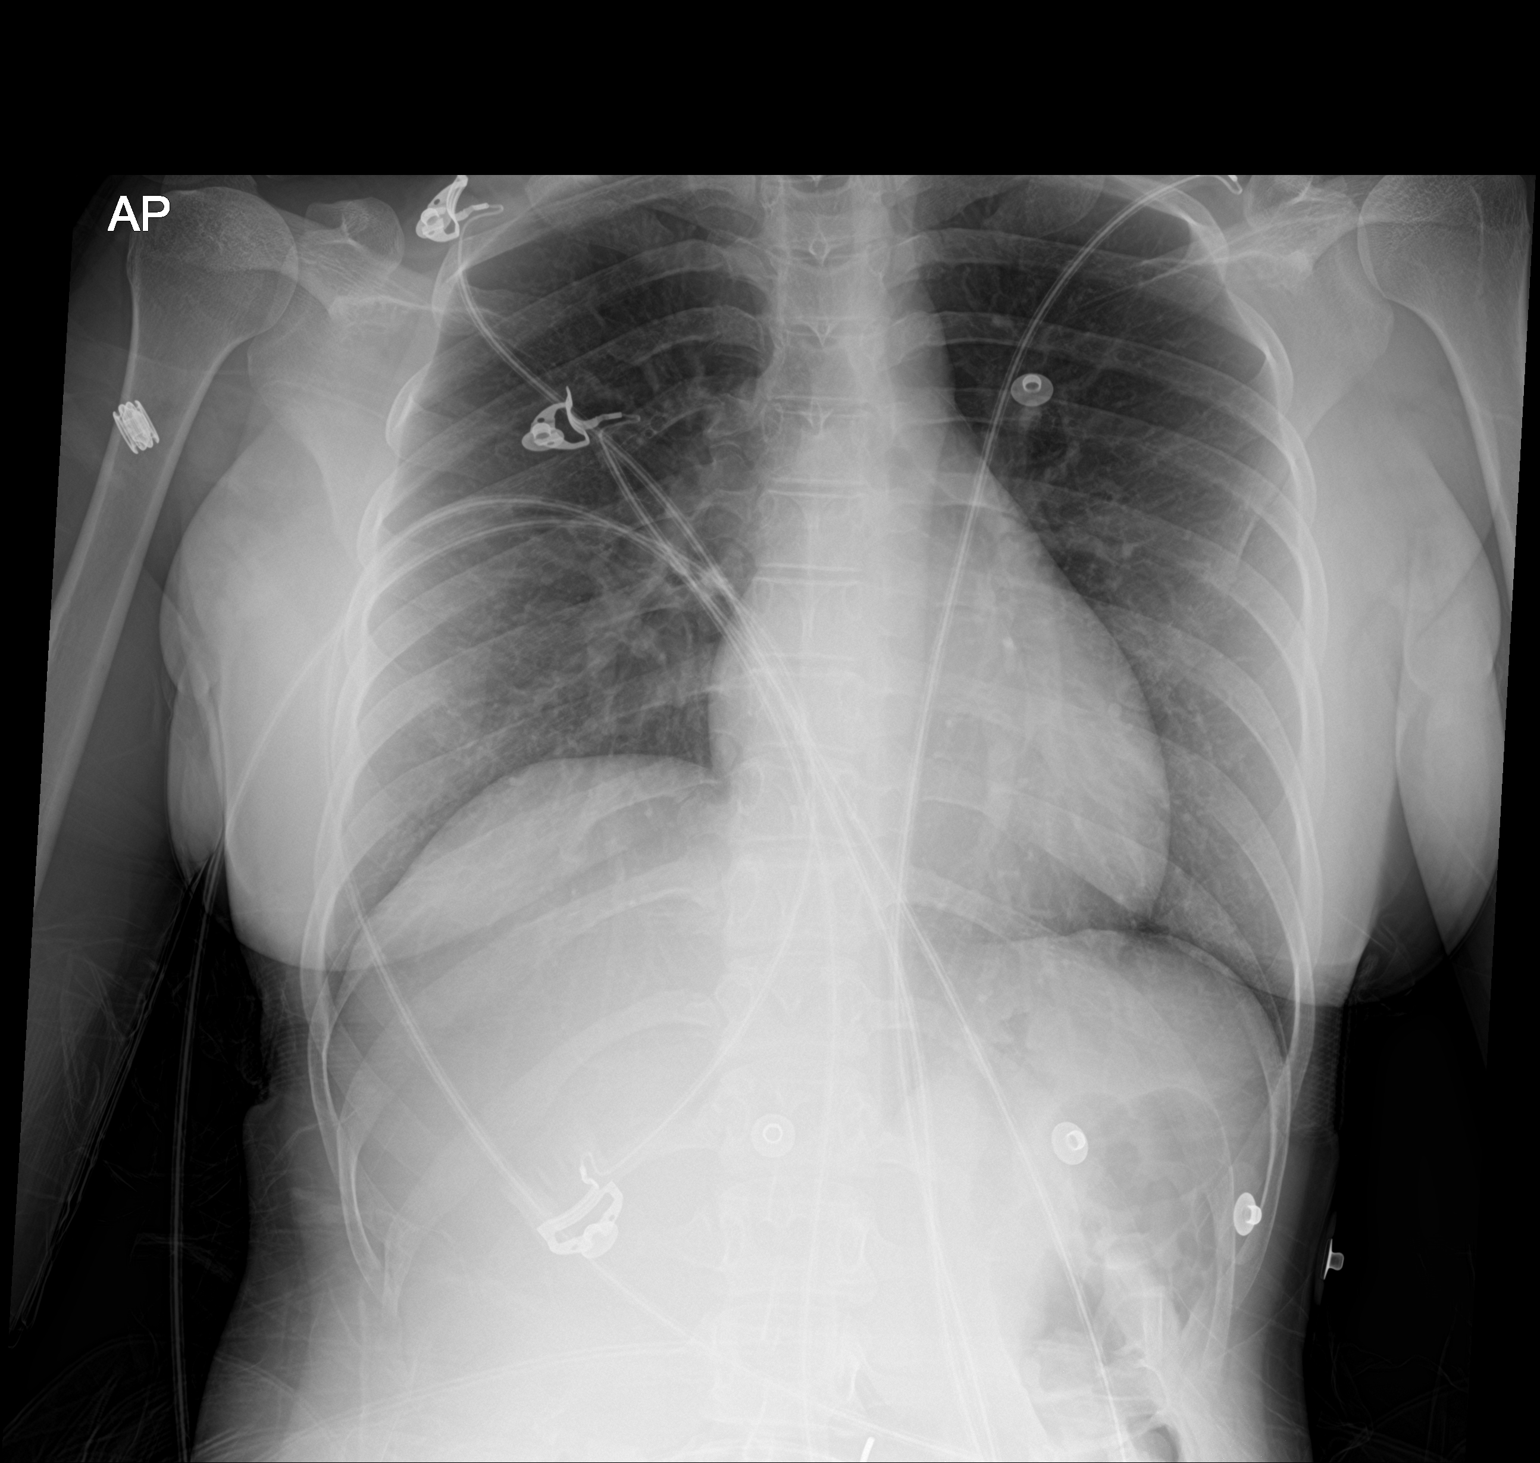

[2 of 2 positions shown; findings below may reference images not displayed]

FINDINGS: The heart size and mediastinal contours are within normal limits.
Both lungs are clear. The visualized skeletal structures are
unremarkable.
IMPRESSION: No active cardiopulmonary disease.

## 2022-08-15 ENCOUNTER — Ambulatory Visit (INDEPENDENT_AMBULATORY_CARE_PROVIDER_SITE_OTHER): Payer: No Typology Code available for payment source

## 2022-08-15 DIAGNOSIS — M7751 Other enthesopathy of right foot: Secondary | ICD-10-CM

## 2022-08-15 NOTE — Progress Notes (Signed)
Patient presents today to pick up custom molded foot orthotics, diagnosed with capsulitis by Dr. Paulla Dolly.   Orthotics were dispensed and fit was satisfactory. Reviewed instructions for break-in and wear. Written instructions given to patient.  Patient will follow up as needed.   [facility] - order # [id number]

## 2022-09-03 ENCOUNTER — Other Ambulatory Visit: Payer: Self-pay

## 2022-09-03 ENCOUNTER — Emergency Department (HOSPITAL_BASED_OUTPATIENT_CLINIC_OR_DEPARTMENT_OTHER)
Admission: EM | Admit: 2022-09-03 | Discharge: 2022-09-03 | Disposition: A | Discharge status: Home / Self Care | Payer: No Typology Code available for payment source | Attending: Emergency Medicine | Admitting: Emergency Medicine

## 2022-09-03 ENCOUNTER — Encounter (HOSPITAL_BASED_OUTPATIENT_CLINIC_OR_DEPARTMENT_OTHER): Payer: Self-pay | Admitting: Emergency Medicine

## 2022-09-03 ENCOUNTER — Emergency Department (HOSPITAL_BASED_OUTPATIENT_CLINIC_OR_DEPARTMENT_OTHER): Payer: No Typology Code available for payment source

## 2022-09-03 DIAGNOSIS — J45909 Unspecified asthma, uncomplicated: Secondary | ICD-10-CM | POA: Insufficient documentation

## 2022-09-03 DIAGNOSIS — N3001 Acute cystitis with hematuria: Secondary | ICD-10-CM | POA: Insufficient documentation

## 2022-09-03 DIAGNOSIS — R300 Dysuria: Secondary | ICD-10-CM | POA: Diagnosis present

## 2022-09-03 DIAGNOSIS — R110 Nausea: Secondary | ICD-10-CM | POA: Diagnosis not present

## 2022-09-03 LAB — COMPREHENSIVE METABOLIC PANEL
ALT: 12 U/L (ref 0–44)
AST: 13 U/L — ABNORMAL LOW (ref 15–41)
Albumin: 4.6 g/dL (ref 3.5–5.0)
Alkaline Phosphatase: 48 U/L (ref 38–126)
Anion gap: 8 (ref 5–15)
BUN: 13 mg/dL (ref 6–20)
CO2: 25 mmol/L (ref 22–32)
Calcium: 9.3 mg/dL (ref 8.9–10.3)
Chloride: 105 mmol/L (ref 98–111)
Creatinine, Ser: 0.83 mg/dL (ref 0.44–1.00)
GFR, Estimated: >60 mL/min (ref >60)
Glucose, Bld: 80 mg/dL (ref 70–99)
Potassium: 3.9 mmol/L (ref 3.5–5.1)
Sodium: 138 mmol/L (ref 135–145)
Total Bilirubin: 0.8 mg/dL (ref 0.3–1.2)
Total Protein: 7.5 g/dL (ref 6.5–8.1)

## 2022-09-03 LAB — CBC WITH DIFFERENTIAL/PLATELET
Abs Immature Granulocytes: 0.01 10*3/uL (ref 0.00–0.07)
Basophils Absolute: 0 10*3/uL (ref 0.0–0.1)
Basophils Relative: 1 %
Eosinophils Absolute: 0 10*3/uL (ref 0.0–0.5)
Eosinophils Relative: 0 %
HCT: 38.9 % (ref 36.0–46.0)
Hemoglobin: 13.8 g/dL (ref 12.0–15.0)
Immature Granulocytes: 0 %
Lymphocytes Relative: 23 %
Lymphs Abs: 1.3 10*3/uL (ref 0.7–4.0)
MCH: 28.3 pg (ref 26.0–34.0)
MCHC: 35.5 g/dL (ref 30.0–36.0)
MCV: 79.9 fL — ABNORMAL LOW (ref 80.0–100.0)
Monocytes Absolute: 0.3 10*3/uL (ref 0.1–1.0)
Monocytes Relative: 5 %
Neutro Abs: 4.2 10*3/uL (ref 1.7–7.7)
Neutrophils Relative %: 71 %
Platelets: 264 10*3/uL (ref 150–400)
RBC: 4.87 MIL/uL (ref 3.87–5.11)
RDW: 12.8 % (ref 11.5–15.5)
WBC: 5.8 10*3/uL (ref 4.0–10.5)
nRBC: 0 % (ref 0.0–0.2)

## 2022-09-03 LAB — URINALYSIS, ROUTINE W REFLEX MICROSCOPIC
Bilirubin Urine: NEGATIVE
Glucose, UA: NEGATIVE mg/dL
Ketones, ur: NEGATIVE mg/dL
Nitrite: POSITIVE — AB
Protein, ur: NEGATIVE mg/dL
Specific Gravity, Urine: 1.023 (ref 1.005–1.030)
WBC, UA: >50 WBC/hpf — ABNORMAL HIGH (ref 0–5)
pH: 6 (ref 5.0–8.0)

## 2022-09-03 LAB — PREGNANCY, URINE: Preg Test, Ur: NEGATIVE

## 2022-09-03 MED ORDER — CEFDINIR 300 MG PO CAPS: 300.0000 mg | ORAL_CAPSULE | Freq: Two times a day (BID) | ORAL | 0 refills | Status: DC

## 2022-09-03 MED ORDER — CEFTRIAXONE SODIUM 1 G IJ SOLR
1.0000 g | Freq: Once | INTRAMUSCULAR | Status: AC
  Administered 2022-09-03: 1 g via INTRAMUSCULAR
  Filled 2022-09-03: qty 10

## 2022-09-03 MED ORDER — LIDOCAINE HCL (PF) 1 % IJ SOLN
INTRAMUSCULAR | Status: AC
  Filled 2022-09-03: qty 5

## 2022-09-03 MED ORDER — IBUPROFEN 400 MG PO TABS
600.0000 mg | ORAL_TABLET | Freq: Once | ORAL | Status: DC
  Filled 2022-09-03: qty 1

## 2022-09-03 NOTE — ED Triage Notes (Signed)
Pt arrives to ED with c/o dysuria x6 days. She notes that she developed lower back pain over the weekend. She has tried AZO w/o relief.

## 2022-09-03 NOTE — ED Provider Notes (Signed)
[facility] EMERGENCY DEPT Provider Note   CSN: [id number] Arrival date & time: 09/03/22  0845     History  Chief Complaint  Patient presents with   Dysuria    Sandra Vance is a 21 y.o. female.   Dysuria   21 year old female presents emergency department with plaints of dysuria for 6 days as well as left-sided flank pain.  Patient states that she has been trying to manage her symptoms at home with Azo which helped at first but has progressively gotten more uncomfortable.  Reports urinary frequency.  Denies fever, chills, night sweats, chest pain, shortness of breath, abdominal pain, vomiting, vaginal symptoms, change in bowel habits.  She does states that she has felt intermittently nauseous over this time.  Reports flank pain slightly worse from onset but manageable.  Denies history of kidney stones.  Denies radiation of pain.  Past medical history significant for asthma.  Home Medications Prior to Admission medications   Medication Sig Start Date End Date Taking? Authorizing Provider  cefdinir (OMNICEF) 300 MG capsule Take 1 capsule (300 mg total) by mouth 2 (two) times daily. 09/03/22  Yes Sherian Maroon A, PA  AVIANE 0.1-20 MG-MCG tablet Take 1 tablet by mouth daily. 05/30/22   [provider]  cyclobenzaprine (FLEXERIL) 5 MG tablet Take 1 tablet (5 mg total) by mouth at bedtime as needed for muscle spasms. 07/12/22   Rice, Jamesetta Orleans, MD  diclofenac (VOLTAREN) 75 MG EC tablet Take 1 tablet (75 mg total) by mouth 2 (two) times daily. 06/15/22   Lenn Sink, DPM  hydrocortisone (ANUSOL-HC) 2.5 % rectal cream Place 1 application. rectally 2 (two) times daily as needed for hemorrhoids. 01/09/22   Doree Albee, PA-C  hydroxychloroquine (PLAQUENIL) 200 MG tablet Take 1 tablet (200 mg total) by mouth daily. 02/07/22   Rice, Jamesetta Orleans, MD  linaclotide Karlene Einstein) 290 MCG CAPS capsule Take 1 capsule (290 mcg total) by mouth daily before breakfast.  02/28/22   Armbruster, [name], MD  metroNIDAZOLE (FLAGYL) 500 MG tablet Take 1 tablet (500 mg total) by mouth 2 (two) times daily. 03/23/22   Brock Bad, MD  norgestimate-ethinyl estradiol (ORTHO-CYCLEN) 0.25-35 MG-MCG tablet Take 1 tablet by mouth daily.    [provider]  omeprazole (PRILOSEC) 40 MG capsule Take 1 capsule (40 mg total) by mouth daily. 02/28/22   Armbruster, [name], MD  polyethylene glycol (MIRALAX / GLYCOLAX) 17 g packet Take 17 g by mouth 2 (two) times daily. Increase as needed    [provider]  Rectal Protectant-Emollient (CALMOL-4) 76-10 % SUPP Use as directed once to twice daily 02/28/22   Armbruster, [name], MD  triamcinolone cream (KENALOG) 0.1 % Apply 1 Application topically 2 (two) times daily. 07/09/22   Georganna Skeans, MD      Allergies    Shellfish-derived products and Raspberry    Review of Systems   Review of Systems  Genitourinary:  Positive for dysuria.  All other systems reviewed and are negative.   Physical Exam Updated Vital Signs BP 119/70 (BP Location: Right Arm)   Pulse 84   Temp 97.9 F (36.6 C)   Resp 14   Ht 4\' 11"  (1.499 m)   Wt 44.5 kg   SpO2 100%   BMI 19.79 kg/m  Physical Exam Vitals and nursing note reviewed.  Constitutional:      General: She is not in acute distress.    Appearance: She is well-developed.  HENT:  Head: Normocephalic and atraumatic.  Eyes:     Conjunctiva/sclera: Conjunctivae normal.  Cardiovascular:     Rate and Rhythm: Normal rate and regular rhythm.     Heart sounds: No murmur heard. Pulmonary:     Effort: Pulmonary effort is normal. No respiratory distress.     Breath sounds: Normal breath sounds.  Abdominal:     Palpations: Abdomen is soft.     Tenderness: There is abdominal tenderness. There is left CVA tenderness. There is no guarding.     Comments: Suprapubic tenderness to palpation.  Musculoskeletal:        General: No swelling.     Cervical back: Normal range of  motion and neck supple. No rigidity or tenderness.     Right lower leg: No edema.     Left lower leg: No edema.  Skin:    General: Skin is warm and dry.     Capillary Refill: Capillary refill takes less than 2 seconds.  Neurological:     Mental Status: She is alert.  Psychiatric:        Mood and Affect: Mood normal.     ED Results / Procedures / Treatments   Labs (all labs ordered are listed, but only abnormal results are displayed) Labs Reviewed  URINALYSIS, ROUTINE W REFLEX MICROSCOPIC - Abnormal; Notable for the following components:      Result Value   APPearance HAZY (*)    Hgb urine dipstick LARGE (*)    Nitrite POSITIVE (*)    Leukocytes,Ua MODERATE (*)    WBC, UA >50 (*)    Bacteria, UA RARE (*)    Non Squamous Epithelial 0-5 (*)    All other components within normal limits  COMPREHENSIVE METABOLIC PANEL - Abnormal; Notable for the following components:   AST 13 (*)    All other components within normal limits  CBC WITH DIFFERENTIAL/PLATELET - Abnormal; Notable for the following components:   MCV 79.9 (*)    All other components within normal limits  URINE CULTURE  PREGNANCY, URINE    EKG None  Radiology CT Renal Stone Study  Result Date: 09/03/2022 CLINICAL DATA:  [age]-year-old female with dysuria for 6 days. Onset of low back pain the last 2 days. EXAM: CT ABDOMEN AND PELVIS WITHOUT CONTRAST TECHNIQUE: Multidetector CT imaging of the abdomen and pelvis was performed following the standard protocol without IV contrast. RADIATION DOSE REDUCTION: This exam was performed according to the departmental dose-optimization program which includes automated exposure control, adjustment of the mA and/or kV according to patient size and/or use of iterative reconstruction technique. COMPARISON:  Noncontrast CT Abdomen and Pelvis 11/07/2021. CT Abdomen and Pelvis with contrast 06/08/2021. FINDINGS: Lower chest: Negative. Hepatobiliary: Negative noncontrast liver and  gallbladder. Pancreas: Negative. Spleen: Negative. Adrenals/Urinary Tract: Normal adrenal glands. Noncontrast kidneys appears stable since January and within normal limits. No nephrolithiasis, hydronephrosis, or pararenal inflammation. Both proximal ureters appear decompressed. Unremarkable bladder. Stomach/Bowel: Retained gas and stool in the rectum. Redundant but mostly decompressed sigmoid in the pelvis. Mild retained gas and stool in the upstream large bowel. Appendix not delineated. No large bowel inflammation is evident. Nondilated terminal ileum, and no dilated small bowel in the abdomen or pelvis. Negative noncontrast stomach and duodenum. No free air or free fluid. Vascular/Lymphatic: Normal caliber abdominal aorta. No calcified atherosclerosis or lymphadenopathy identified. Reproductive: Negative noncontrast appearance. Other: No pelvic free fluid. Musculoskeletal: Negative. IMPRESSION: No urinary calculus or obstructive uropathy. No acute or inflammatory process identified in the noncontrast abdomen  or pelvis. Electronically Signed   By: Odessa Fleming M.D.   On: 09/03/2022 12:05    Procedures Procedures    Medications Ordered in ED Medications  cefTRIAXone (ROCEPHIN) injection 1 g (has no administration in time range)  ibuprofen (ADVIL) tablet 600 mg (has no administration in time range)    ED Course/ Medical Decision Making/ A&P Clinical Course as of 09/03/22 1250  Mon Sep 03, 2022  1143 Left flank sat Urinary since monday [CR]    Clinical Course User Index [CR] Peter Garter, PA                           Medical Decision Making Amount and/or Complexity of Data Reviewed Labs: ordered. Radiology: ordered.  Risk Prescription drug management.   This patient presents to the ED for concern of urinary symptoms, this involves an extensive number of treatment options, and is a complaint that carries with it a high risk of complications and morbidity.  The differential diagnosis  includes sepsis, urinary tract infection, pyelonephritis, nephrolithiasis, appendicitis, ovarian torsion, ectopic pregnancy, diverticulitis, mesenteric ischemia, SBO/LBO   Co morbidities that complicate the patient evaluation  See HPI   Additional history obtained:  Additional history obtained from EMR External records from outside source obtained and reviewed including hospital records   Lab Tests:  I Ordered, and personally interpreted labs.  The pertinent results include: No leukocytosis noted.  No evidence of anemia.  Platelets within normal range.  No electrolyte abnormalities.  Renal function within normal limits.  No transaminitis.  Urine pregnancy negative.  Urine culture pending.  UA significant for moderate leukocyte, positive nitrite, greater than 50 WBC as well as red bacteria concerning for urinary tract infection.   Imaging Studies ordered:  I ordered imaging studies including CT renal stone study I independently visualized and interpreted imaging which showed no urinary calculus or obstructive uropathy.  No acute inflammatory process identified in the noncontrast abdomen or pelvis. I agree with the radiologist interpretation  Cardiac Monitoring: / EKG:  The patient was maintained on a cardiac monitor.  I personally viewed and interpreted the cardiac monitored which showed an underlying rhythm of: Sinus rhythm   Consultations Obtained:  N/a   Problem List / ED Course / Critical interventions / Medication management  Urinary tract infection/flank pain I ordered medication including Rocephin for antibiotic therapy.  Ibuprofen for pain f  Reevaluation of the patient after these medicines showed that the patient improved I have reviewed the patients home medicines and have made adjustments as needed   Social Determinants of Health:  Denies tobacco, licit drug use   Test / Admission - Considered:  Urinary tract infection Vitals signs  within normal range  and stable throughout visit. Laboratory/imaging studies significant for: See above Patient symptoms likely secondary to urinary tract infection.  Some concern for pyelonephritis given new onset flank pain.  Patient afebrile with no change in renal function and no inflammatory findings on noncontrasted CT.  Patient deemed capable of outpatient therapy with oral antibiotics after dose of Rocephin given the emergency department with close PCP/urology follow-up outpatient.  Pain to be managed with Tylenol/ibuprofen as needed.  Patient recommended to drink plenty of hydrating fluids.  Treatment plan discussed along with patient she cannot understand was agreeable to said plan. Worrisome signs and symptoms were discussed with the patient, and the patient acknowledged understanding to return to the ED if noticed. Patient was stable upon discharge.  Final Clinical Impression(s) / ED Diagnoses Final diagnoses:  Acute cystitis with hematuria    Rx / DC Orders ED Discharge Orders          Ordered    cefdinir (OMNICEF) 300 MG capsule  2 times daily        09/03/22 1245              Peter GarterRobbins, Stephana Morell A, GeorgiaPA 09/03/22 1250    Tegeler, Canary Brimhristopher J, MD 09/03/22 [phone]

## 2022-09-03 NOTE — ED Notes (Signed)
Patient verbalizes understanding of discharge instructions. Opportunity for questioning and answers were provided. Patient discharged from ED.  °

## 2022-09-03 NOTE — Discharge Instructions (Addendum)
Note that your work-up today was over concerning for urinary tract infection.  Given your flank pain, will treat with extended course of antibiotics for concern of progression to infection of your left kidney.  Recommend reevaluation of your symptoms in 3 to 5 days by your primary care provider/urology.  Take Tylenol/Motrin as needed for pain.  Please do not hesitate to return to emergency department for worrisome signs and symptoms we discussed become apparent.

## 2022-09-05 LAB — URINE CULTURE: Culture: >=100000 — AB

## 2022-09-06 ENCOUNTER — Telehealth (HOSPITAL_BASED_OUTPATIENT_CLINIC_OR_DEPARTMENT_OTHER): Payer: Self-pay | Admitting: *Deleted

## 2022-09-06 NOTE — Telephone Encounter (Signed)
Post ED Visit - Positive Culture Follow-up  Culture report reviewed by antimicrobial stewardship pharmacist: [facility] []  , Pharm.D. []  Enzo Bi, Pharm.D., BCPS AQ-ID []  , Pharm.D., BCPS []  Celedonio Miyamoto, Pharm.D., BCPS []  [place], Garvin Fila.D., BCPS, AAHIVP []  , Pharm.D., BCPS, AAHIVP []  Georgina Pillion, PharmD, BCPS []  , PharmD, BCPS []  [place], PharmD, BCPS []  [place], PharmD []  , PharmD, BCPS []  Estella Husk, PharmD  Pharmacy Team []  Lysle Pearl, PharmD []  , PharmD []  Phillips Climes, PharmD []  , Rph []  Agapito Games) , PharmD []  Verlan Friends, PharmD []  , PharmD []  Mervyn Gay, PharmD []  , PharmD []  Vinnie Level, PharmD []  Wonda Olds, PharmD []  , PharmD []  Len Childs, PharmD   Positive urine culture Treated with Cefdinir, organism sensitive to the same and no further patient follow-up is required at this time.  , Pharm D  Greer Pickerel 09/06/2022, 9:35 AM

## 2022-12-07 ENCOUNTER — Encounter: Payer: Self-pay | Admitting: Podiatry

## 2022-12-07 ENCOUNTER — Ambulatory Visit (INDEPENDENT_AMBULATORY_CARE_PROVIDER_SITE_OTHER): Payer: 59

## 2022-12-07 ENCOUNTER — Ambulatory Visit: Payer: 59 | Admitting: Podiatry

## 2022-12-07 DIAGNOSIS — M76821 Posterior tibial tendinitis, right leg: Secondary | ICD-10-CM

## 2022-12-07 DIAGNOSIS — M7751 Other enthesopathy of right foot: Secondary | ICD-10-CM

## 2022-12-07 MED ORDER — TRIAMCINOLONE ACETONIDE 10 MG/ML IJ SUSP
10.0000 mg | Freq: Once | INTRAMUSCULAR | Status: AC
  Administered 2022-12-07: 10 mg

## 2022-12-07 NOTE — Progress Notes (Signed)
Subjective:   Patient ID: Sandra Vance, female   DOB: 22 y.o.   MRN: [id number]   HPI Patient presents stating over the last months she is developed a lot of pain on the inside of her right ankle.  The outside that we had worked on last year is doing well with the orthotics fit well   ROS      Objective:  Physical Exam  Neurovascular status intact inflammation pain of the posterior tibial tendon right as it comes into the navicular with moderate depression of the arch and no other pathology noted     Assessment:  Acute posterior tibial tendinitis right with inflammation with foot structure as issue     Plan:  H&P reviewed x-ray sterile prep injected carefully along the sheath of the tendon explaining risk 3 mg Dexasone Kenalog 5 mg Xylocaine applied fascial brace to lift up the arch instructed on using her orthotics and placed on oral anti-inflammatory.  Reappoint 1 month may require MRI or other treatment depending on symptoms  X-rays indicate moderate to the arch did not indicate other pathology currently

## 2023-01-07 DIAGNOSIS — G5601 Carpal tunnel syndrome, right upper limb: Secondary | ICD-10-CM | POA: Insufficient documentation

## 2023-01-19 ENCOUNTER — Inpatient Hospital Stay (HOSPITAL_COMMUNITY)
Admission: EM | Admit: 2023-01-19 | Discharge: 2023-01-21 | DRG: 872 | Disposition: A | Discharge status: Home / Self Care | Payer: 59 | Attending: Family Medicine | Admitting: Family Medicine

## 2023-01-19 ENCOUNTER — Other Ambulatory Visit: Payer: Self-pay

## 2023-01-19 ENCOUNTER — Ambulatory Visit (HOSPITAL_COMMUNITY): Payer: Self-pay

## 2023-01-19 ENCOUNTER — Encounter (HOSPITAL_COMMUNITY): Payer: Self-pay

## 2023-01-19 ENCOUNTER — Emergency Department (HOSPITAL_COMMUNITY): Payer: 59

## 2023-01-19 DIAGNOSIS — Z832 Family history of diseases of the blood and blood-forming organs and certain disorders involving the immune mechanism: Secondary | ICD-10-CM | POA: Diagnosis not present

## 2023-01-19 DIAGNOSIS — J4520 Mild intermittent asthma, uncomplicated: Secondary | ICD-10-CM | POA: Diagnosis not present

## 2023-01-19 DIAGNOSIS — Z1152 Encounter for screening for COVID-19: Secondary | ICD-10-CM

## 2023-01-19 DIAGNOSIS — I7300 Raynaud's syndrome without gangrene: Secondary | ICD-10-CM | POA: Diagnosis present

## 2023-01-19 DIAGNOSIS — A419 Sepsis, unspecified organism: Secondary | ICD-10-CM | POA: Diagnosis not present

## 2023-01-19 DIAGNOSIS — N12 Tubulo-interstitial nephritis, not specified as acute or chronic: Secondary | ICD-10-CM

## 2023-01-19 DIAGNOSIS — Z91013 Allergy to seafood: Secondary | ICD-10-CM

## 2023-01-19 DIAGNOSIS — D72819 Decreased white blood cell count, unspecified: Secondary | ICD-10-CM | POA: Diagnosis not present

## 2023-01-19 DIAGNOSIS — R509 Fever, unspecified: Secondary | ICD-10-CM

## 2023-01-19 DIAGNOSIS — R000 Tachycardia, unspecified: Secondary | ICD-10-CM

## 2023-01-19 DIAGNOSIS — Z800 Family history of malignant neoplasm of digestive organs: Secondary | ICD-10-CM

## 2023-01-19 DIAGNOSIS — Z79899 Other long term (current) drug therapy: Secondary | ICD-10-CM | POA: Diagnosis not present

## 2023-01-19 DIAGNOSIS — N760 Acute vaginitis: Secondary | ICD-10-CM | POA: Diagnosis not present

## 2023-01-19 DIAGNOSIS — Z825 Family history of asthma and other chronic lower respiratory diseases: Secondary | ICD-10-CM

## 2023-01-19 DIAGNOSIS — N10 Acute tubulo-interstitial nephritis: Secondary | ICD-10-CM | POA: Diagnosis not present

## 2023-01-19 DIAGNOSIS — R1110 Vomiting, unspecified: Secondary | ICD-10-CM | POA: Diagnosis present

## 2023-01-19 DIAGNOSIS — Z833 Family history of diabetes mellitus: Secondary | ICD-10-CM

## 2023-01-19 DIAGNOSIS — Z87440 Personal history of urinary (tract) infections: Secondary | ICD-10-CM

## 2023-01-19 DIAGNOSIS — Z818 Family history of other mental and behavioral disorders: Secondary | ICD-10-CM | POA: Diagnosis not present

## 2023-01-19 DIAGNOSIS — K219 Gastro-esophageal reflux disease without esophagitis: Secondary | ICD-10-CM | POA: Diagnosis not present

## 2023-01-19 DIAGNOSIS — B9689 Other specified bacterial agents as the cause of diseases classified elsewhere: Secondary | ICD-10-CM

## 2023-01-19 DIAGNOSIS — Z91018 Allergy to other foods: Secondary | ICD-10-CM | POA: Diagnosis not present

## 2023-01-19 DIAGNOSIS — Z793 Long term (current) use of hormonal contraceptives: Secondary | ICD-10-CM | POA: Diagnosis not present

## 2023-01-19 DIAGNOSIS — N390 Urinary tract infection, site not specified: Secondary | ICD-10-CM | POA: Diagnosis not present

## 2023-01-19 DIAGNOSIS — Z803 Family history of malignant neoplasm of breast: Secondary | ICD-10-CM

## 2023-01-19 DIAGNOSIS — R109 Unspecified abdominal pain: Secondary | ICD-10-CM | POA: Diagnosis not present

## 2023-01-19 LAB — RESP PANEL BY RT-PCR (RSV, FLU A&B, COVID)  RVPGX2
Influenza A by PCR: NEGATIVE
Influenza B by PCR: NEGATIVE
Resp Syncytial Virus by PCR: NEGATIVE
SARS Coronavirus 2 by RT PCR: NEGATIVE

## 2023-01-19 LAB — URINALYSIS, MICROSCOPIC (REFLEX)

## 2023-01-19 LAB — CBC WITH DIFFERENTIAL/PLATELET
Abs Immature Granulocytes: 0.01 10*3/uL (ref 0.00–0.07)
Basophils Absolute: 0 10*3/uL (ref 0.0–0.1)
Basophils Relative: 1 %
Eosinophils Absolute: 0 10*3/uL (ref 0.0–0.5)
Eosinophils Relative: 0 %
HCT: 36.7 % (ref 36.0–46.0)
Hemoglobin: 12.8 g/dL (ref 12.0–15.0)
Immature Granulocytes: 0 %
Lymphocytes Relative: 18 %
Lymphs Abs: 0.7 10*3/uL (ref 0.7–4.0)
MCH: 27.6 pg (ref 26.0–34.0)
MCHC: 34.9 g/dL (ref 30.0–36.0)
MCV: 79.3 fL — ABNORMAL LOW (ref 80.0–100.0)
Monocytes Absolute: 0.3 10*3/uL (ref 0.1–1.0)
Monocytes Relative: 7 %
Neutro Abs: 2.9 10*3/uL (ref 1.7–7.7)
Neutrophils Relative %: 74 %
Platelets: 171 10*3/uL (ref 150–400)
RBC: 4.63 MIL/uL (ref 3.87–5.11)
RDW: 12.5 % (ref 11.5–15.5)
WBC: 3.9 10*3/uL — ABNORMAL LOW (ref 4.0–10.5)
nRBC: 0 % (ref 0.0–0.2)

## 2023-01-19 LAB — URINALYSIS, ROUTINE W REFLEX MICROSCOPIC
Glucose, UA: NEGATIVE mg/dL
Ketones, ur: NEGATIVE mg/dL
Nitrite: POSITIVE — AB
Protein, ur: 100 mg/dL — AB
Specific Gravity, Urine: >1.03 — ABNORMAL HIGH (ref 1.005–1.030)
pH: 5.5 (ref 5.0–8.0)

## 2023-01-19 LAB — WET PREP, GENITAL
Sperm: NONE SEEN
Trich, Wet Prep: NONE SEEN
WBC, Wet Prep HPF POC: <10 (ref <10)
Yeast Wet Prep HPF POC: NONE SEEN

## 2023-01-19 LAB — HIV ANTIBODY (ROUTINE TESTING W REFLEX): HIV Screen 4th Generation wRfx: NONREACTIVE

## 2023-01-19 LAB — CREATININE, SERUM
Creatinine, Ser: 0.75 mg/dL (ref 0.44–1.00)
GFR, Estimated: >60 mL/min

## 2023-01-19 LAB — BASIC METABOLIC PANEL
Anion gap: 8 (ref 5–15)
BUN: 13 mg/dL (ref 6–20)
CO2: 21 mmol/L — ABNORMAL LOW (ref 22–32)
Calcium: 8.2 mg/dL — ABNORMAL LOW (ref 8.9–10.3)
Chloride: 106 mmol/L (ref 98–111)
Creatinine, Ser: 0.84 mg/dL (ref 0.44–1.00)
GFR, Estimated: >60 mL/min (ref >60)
Glucose, Bld: 81 mg/dL (ref 70–99)
Potassium: 3.7 mmol/L (ref 3.5–5.1)
Sodium: 135 mmol/L (ref 135–145)

## 2023-01-19 LAB — CBC
HCT: 34.3 % — ABNORMAL LOW (ref 36.0–46.0)
Hemoglobin: 12.3 g/dL (ref 12.0–15.0)
MCH: 28.6 pg (ref 26.0–34.0)
MCHC: 35.9 g/dL (ref 30.0–36.0)
MCV: 79.8 fL — ABNORMAL LOW (ref 80.0–100.0)
Platelets: 172 10*3/uL (ref 150–400)
RBC: 4.3 MIL/uL (ref 3.87–5.11)
RDW: 12.4 % (ref 11.5–15.5)
WBC: 3.3 10*3/uL — ABNORMAL LOW (ref 4.0–10.5)
nRBC: 0 % (ref 0.0–0.2)

## 2023-01-19 LAB — LACTIC ACID, PLASMA: Lactic Acid, Venous: 0.9 mmol/L (ref 0.5–1.9)

## 2023-01-19 LAB — PREGNANCY, URINE: Preg Test, Ur: NEGATIVE

## 2023-01-19 MED ORDER — LACTATED RINGERS IV BOLUS
1000.0000 mL | Freq: Once | INTRAVENOUS | Status: AC
  Administered 2023-01-19: 1000 mL via INTRAVENOUS

## 2023-01-19 MED ORDER — SODIUM CHLORIDE 0.9 % IV SOLN
2.0000 g | INTRAVENOUS | Status: DC
  Administered 2023-01-19: 2 g via INTRAVENOUS
  Filled 2023-01-19: qty 20

## 2023-01-19 MED ORDER — TRAZODONE HCL 50 MG PO TABS: 25.0000 mg | ORAL_TABLET | Freq: Every evening | ORAL | Status: DC | PRN

## 2023-01-19 MED ORDER — ONDANSETRON HCL 4 MG/2ML IJ SOLN: 4.0000 mg | Freq: Once | INTRAMUSCULAR | Status: AC

## 2023-01-19 MED ORDER — ACETAMINOPHEN 500 MG PO TABS
1000.0000 mg | ORAL_TABLET | Freq: Once | ORAL | Status: AC
  Administered 2023-01-19: 1000 mg via ORAL
  Filled 2023-01-19: qty 2

## 2023-01-19 MED ORDER — SENNOSIDES-DOCUSATE SODIUM 8.6-50 MG PO TABS: 1.0000 | ORAL_TABLET | Freq: Every evening | ORAL | Status: DC | PRN

## 2023-01-19 MED ORDER — CYCLOBENZAPRINE HCL 5 MG PO TABS: 5.0000 mg | ORAL_TABLET | Freq: Every evening | ORAL | Status: DC | PRN

## 2023-01-19 MED ORDER — LACTATED RINGERS IV SOLN: INTRAVENOUS | Status: DC

## 2023-01-19 MED ORDER — ACETAMINOPHEN 325 MG PO TABS
650.0000 mg | ORAL_TABLET | Freq: Four times a day (QID) | ORAL | Status: DC | PRN
  Administered 2023-01-19: 650 mg via ORAL
  Filled 2023-01-19: qty 2

## 2023-01-19 MED ORDER — IOHEXOL 300 MG/ML  SOLN
100.0000 mL | Freq: Once | INTRAMUSCULAR | Status: AC | PRN
  Administered 2023-01-19: 100 mL via INTRAVENOUS

## 2023-01-19 MED ORDER — ENOXAPARIN SODIUM 40 MG/0.4ML IJ SOSY
40.0000 mg | PREFILLED_SYRINGE | INTRAMUSCULAR | Status: DC
  Administered 2023-01-19 – 2023-01-20 (×2): 40 mg via SUBCUTANEOUS
  Filled 2023-01-19 (×2): qty 0.4

## 2023-01-19 MED ORDER — LACTATED RINGERS IV BOLUS
500.0000 mL | Freq: Once | INTRAVENOUS | Status: AC
  Administered 2023-01-19: 500 mL via INTRAVENOUS

## 2023-01-19 MED ORDER — ALBUTEROL SULFATE (2.5 MG/3ML) 0.083% IN NEBU: 2.5000 mg | INHALATION_SOLUTION | RESPIRATORY_TRACT | Status: DC | PRN

## 2023-01-19 MED ORDER — ACETAMINOPHEN 650 MG RE SUPP: 650.0000 mg | Freq: Four times a day (QID) | RECTAL | Status: DC | PRN

## 2023-01-19 MED ORDER — SODIUM CHLORIDE 0.9 % IV SOLN
1.0000 g | INTRAVENOUS | Status: DC
  Administered 2023-01-20 – 2023-01-21 (×2): 1 g via INTRAVENOUS
  Filled 2023-01-19 (×2): qty 10

## 2023-01-19 MED ORDER — LACTATED RINGERS IV SOLN: INTRAVENOUS | Status: AC

## 2023-01-19 MED ORDER — HYDROXYCHLOROQUINE SULFATE 200 MG PO TABS
200.0000 mg | ORAL_TABLET | Freq: Every day | ORAL | Status: DC
  Administered 2023-01-19 – 2023-01-21 (×3): 200 mg via ORAL
  Filled 2023-01-19 (×3): qty 1

## 2023-01-19 MED ORDER — METRONIDAZOLE 500 MG PO TABS
500.0000 mg | ORAL_TABLET | Freq: Once | ORAL | Status: AC
  Administered 2023-01-19: 500 mg via ORAL
  Filled 2023-01-19: qty 1

## 2023-01-19 MED ORDER — ONDANSETRON HCL 4 MG/2ML IJ SOLN
4.0000 mg | Freq: Four times a day (QID) | INTRAMUSCULAR | Status: DC | PRN
  Administered 2023-01-20: 4 mg via INTRAVENOUS
  Filled 2023-01-19: qty 2

## 2023-01-19 MED ORDER — ONDANSETRON HCL 4 MG/2ML IJ SOLN
INTRAMUSCULAR | Status: AC
  Administered 2023-01-19: 4 mg via INTRAVENOUS
  Filled 2023-01-19: qty 2

## 2023-01-19 MED ORDER — ONDANSETRON HCL 4 MG PO TABS: 4.0000 mg | ORAL_TABLET | Freq: Four times a day (QID) | ORAL | Status: DC | PRN

## 2023-01-19 NOTE — ED Notes (Signed)
Self swab teaching performed.

## 2023-01-19 NOTE — ED Notes (Signed)
Patient was in CT during antbiotics ordered and patient just returned.

## 2023-01-19 NOTE — H&P (Signed)
History and Physical  Sandra Vance [id number] DOB: [birth year] DOA: 01/19/2023  PCP: Dorna Mai, MD   Chief Complaint: fever, flank pain, weakness   HPI: Sandra Vance is a 22 y.o. female with medical history significant for GERD, asthma, constipation, presented to the emergency department this morning after presenting for here to work with complaints of back/flank pain and generalized bodyaches.  States that over the last few days she has had dysuria, as well as chills and subjective fever.  Has also been having some left-sided flank pain.  Says that it feels similar to when she had a UTI in the past.  ED Course: In the emergency department, in the emergency department, she had fever of 100.8, heart rate as high as 124.  Blood pressure in the low 100s, saturating 100% on room air.  Lab work was obtained, most notably showing abnormal urinalysis.  Wet prep was also consistent with bacterial vaginosis, was given a dose of Flagyl.  Review of Systems: Please see HPI for pertinent positives and negatives. A complete 10 system review of systems are otherwise negative.  Past Medical History:  Diagnosis Date   Asthma    Constipation    History reviewed. No pertinent surgical history.  Social History:  reports that she has never smoked. She has never been exposed to tobacco smoke. She has never used smokeless tobacco. She reports current alcohol use. She reports that she does not use drugs.   Allergies  Allergen Reactions   Shellfish-Derived Products    Raspberry Itching and Rash    Family History  Problem Relation Age of Onset   Lupus Mother    Asthma Mother    Irritable bowel syndrome Mother    Healthy Father    Autoimmune disease Sister    Asthma Sister    ADD / ADHD Sister    ADD / ADHD Sister    Asthma Sister    Asthma Brother    Sickle cell anemia Maternal Grandmother    Diabetes Maternal Grandfather    Prostate cancer Paternal Grandfather    Diabetes Maternal  Aunt    Lupus Paternal Aunt    Fibromyalgia Paternal Aunt    Colon cancer Maternal Great-grandfather    Breast cancer Maternal Great-grandmother    Esophageal cancer Neg Hx    Stomach cancer Neg Hx    Pancreatic cancer Neg Hx    Colon polyps Neg Hx      Prior to Admission medications   Medication Sig Start Date End Date Taking? Authorizing Provider  AVIANE 0.1-20 MG-MCG tablet Take 1 tablet by mouth daily. 05/30/22   [provider]  cefdinir (OMNICEF) 300 MG capsule Take 1 capsule (300 mg total) by mouth 2 (two) times daily. 09/03/22   Wilnette Kales, PA  cyclobenzaprine (FLEXERIL) 5 MG tablet Take 1 tablet (5 mg total) by mouth at bedtime as needed for muscle spasms. 07/12/22   Rice, Resa Miner, MD  diclofenac (VOLTAREN) 75 MG EC tablet Take 1 tablet (75 mg total) by mouth 2 (two) times daily. 06/15/22   Wallene Huh, DPM  hydrocortisone (ANUSOL-HC) 2.5 % rectal cream Place 1 application. rectally 2 (two) times daily as needed for hemorrhoids. 01/09/22   Vladimir Crofts, PA-C  hydroxychloroquine (PLAQUENIL) 200 MG tablet Take 1 tablet (200 mg total) by mouth daily. 02/07/22   Collier Salina, MD  linaclotide Rolan Lipa) 290 MCG CAPS capsule Take 1 capsule (290 mcg total) by mouth daily before breakfast. 02/28/22  Yetta Flock, MD  metroNIDAZOLE (FLAGYL) 500 MG tablet Take 1 tablet (500 mg total) by mouth 2 (two) times daily. 03/23/22   Shelly Bombard, MD  norgestimate-ethinyl estradiol (ORTHO-CYCLEN) 0.25-35 MG-MCG tablet Take 1 tablet by mouth daily.    [provider]  omeprazole (PRILOSEC) 40 MG capsule Take 1 capsule (40 mg total) by mouth daily. 02/28/22   Armbruster, Carlota Raspberry, MD  polyethylene glycol (MIRALAX / GLYCOLAX) 17 g packet Take 17 g by mouth 2 (two) times daily. Increase as needed    [provider]  Rectal Protectant-Emollient (CALMOL-4) 76-10 % SUPP Use as directed once to twice daily 02/28/22   Armbruster, Carlota Raspberry, MD   triamcinolone cream (KENALOG) 0.1 % Apply 1 Application topically 2 (two) times daily. 07/09/22   Dorna Mai, MD    Physical Exam: BP 112/76 (BP Location: Right Arm)   Pulse (!) 123   Temp 100.1 F (37.8 C) (Oral)   Resp 16   Ht 4\' 11"  (1.499 m)   Wt 45.4 kg   LMP 01/02/2023 (Exact Date)   SpO2 100%   BMI 20.20 kg/m   General:  Alert, oriented, calm, in no acute distress  Eyes: EOMI, clear conjuctivae, white sclerea Neck: supple, no masses, trachea mildline  Cardiovascular: RRR, no murmurs or rubs, no peripheral edema  Respiratory: clear to auscultation bilaterally, no wheezes, no crackles  Abdomen: soft, nontender, nondistended, normal bowel tones heard  Skin: dry, no rashes  Musculoskeletal: no joint effusions, normal range of motion  Psychiatric: appropriate affect, normal speech  Neurologic: extraocular muscles intact, clear speech, moving all extremities with intact sensorium          Labs on Admission:  Basic Metabolic Panel: Recent Labs  Lab 01/19/23 0819  NA 135  K 3.7  CL 106  CO2 21*  GLUCOSE 81  BUN 13  CREATININE 0.84  CALCIUM 8.2*   Liver Function Tests: No results for input(s): "AST", "ALT", "ALKPHOS", "BILITOT", "PROT", "ALBUMIN" in the last 168 hours. No results for input(s): "LIPASE", "AMYLASE" in the last 168 hours. No results for input(s): "AMMONIA" in the last 168 hours. CBC: Recent Labs  Lab 01/19/23 0819  WBC 3.9*  NEUTROABS 2.9  HGB 12.8  HCT 36.7  MCV 79.3*  PLT 171   Cardiac Enzymes: No results for input(s): "CKTOTAL", "CKMB", "CKMBINDEX", "TROPONINI" in the last 168 hours.  BNP (last 3 results) No results for input(s): "BNP" in the last 8760 hours.  ProBNP (last 3 results) No results for input(s): "PROBNP" in the last 8760 hours.  CBG: No results for input(s): "GLUCAP" in the last 168 hours.  Radiological Exams on Admission: CT ABDOMEN PELVIS W CONTRAST  Result Date: 01/19/2023 CLINICAL DATA:  Abdominal and flank  pain.  Stone suspected.  Sepsis. EXAM: CT ABDOMEN AND PELVIS WITH CONTRAST TECHNIQUE: Multidetector CT imaging of the abdomen and pelvis was performed using the standard protocol following bolus administration of intravenous contrast. RADIATION DOSE REDUCTION: This exam was performed according to the departmental dose-optimization program which includes automated exposure control, adjustment of the mA and/or kV according to patient size and/or use of iterative reconstruction technique. CONTRAST:  139mL OMNIPAQUE IOHEXOL 300 MG/ML  SOLN COMPARISON:  09/01/2022 stone study FINDINGS: Mild to moderate motion degradation throughout. Lower chest: Not imaged Hepatobiliary: Superior liver not imaged. No significant abnormality in the liver, gallbladder, biliary tract. Pancreas: Normal, without mass or ductal dilatation. Spleen: Spleen only partially imaged.  Normal imaged portions. Adrenals/Urinary Tract: Normal adrenal glands.  Normal kidneys, without hydronephrosis. Normal urinary bladder. Stomach/Bowel: Normal stomach, without wall thickening. Colonic stool burden suggests constipation. Normal terminal ileum. The appendix may be identified laterally on 41/2. No pericecal inflammation identified. Normal small bowel. Vascular/Lymphatic: Normal caliber of the aorta and branch vessels. No abdominopelvic adenopathy. Reproductive: Normal uterus and adnexa. Other: Pelvic cul-de-sac and right adnexal small volume fluid is likely physiologic or could relate to recent follicle/cyst rupture. No free intraperitoneal air. Musculoskeletal: No acute osseous abnormality. IMPRESSION: 1. Mild to moderately motion degraded exam. 2. No gross explanation for patient's symptoms. 3. Possible visualization of the appendix. If high clinical concern of appendicitis, consider repeat when patient is able to breath hold and follow directions. 4.  Possible constipation. Electronically Signed   By: Abigail Miyamoto M.D.   On: 01/19/2023 10:03     Assessment/Plan Principal Problem:   Sepsis secondary to UTI [facility]) -meeting criteria with fever, tachycardia.  Lactic acid is normal. -Inpatient admission -Continue empiric IV Rocephin -Follow blood culture and urine culture  Active Problems, for which home medications have been continued as indicated:   Pyelonephritis-this is suspected due to flank pain, though not corroborated on imaging   Fever   Asthma in adult, mild intermittent, uncomplicated   Raynaud's syndrome without gangrene   Tachycardia   GERD (gastroesophageal reflux disease)  DVT prophylaxis: Lovenox   She is a full code  Consults called: None  Admission status: The appropriate patient status for this patient is INPATIENT. Inpatient status is judged to be reasonable and necessary in order to provide the required intensity of service to ensure the patient's safety. The patient's presenting symptoms, physical exam findings, and initial radiographic and laboratory data in the context of their chronic comorbidities is felt to place them at high risk for further clinical deterioration. Furthermore, it is not anticipated that the patient will be medically stable for discharge from the hospital within 2 midnights of admission.    I certify that at the point of admission it is my clinical judgment that the patient will require inpatient hospital care spanning beyond 2 midnights from the point of admission due to high intensity of service, high risk for further deterioration and high frequency of surveillance required   Time spent: 48 minutes  Braylynn Ghan Neva Seat MD [facility] Pager [phone]  If 7PM-7AM, please contact night-coverage [email or web address] Password [email or web address]  01/19/2023, 11:04 AM

## 2023-01-19 NOTE — ED Provider Notes (Signed)
[place] EMERGENCY DEPARTMENT AT [facility] Provider Note   CSN: [id number] Arrival date & time: 01/19/23  0630     History  Chief Complaint  Patient presents with   Multiple Compalints     Sandra Vance is a 22 y.o. female.  HPI   22 year old female with medical history significant for asthma and constipation who presents to the emergency department with dysuria, back pain/flank pain and generalized bodyaches.  The patient states that over the last few days she has had dysuria which has subsequently developed into chills, generalized bodyaches and left-sided flank pain.  She states it feels similar to when she last had a urinary tract infection.  She endorses some vaginal discharge, denies any vaginal bleeding.  She is not concerned for STI and declines pelvic exam.  She endorses fevers.  She also states that she is having NBNB emesis and has been unable to keep anything down.  Home Medications Prior to Admission medications   Medication Sig Start Date End Date Taking? Authorizing Provider  AVIANE 0.1-20 MG-MCG tablet Take 1 tablet by mouth daily. 05/30/22   [provider]  cefdinir (OMNICEF) 300 MG capsule Take 1 capsule (300 mg total) by mouth 2 (two) times daily. 09/03/22   Wilnette Kales, PA  cyclobenzaprine (FLEXERIL) 5 MG tablet Take 1 tablet (5 mg total) by mouth at bedtime as needed for muscle spasms. 07/12/22   Rice, Resa Miner, MD  diclofenac (VOLTAREN) 75 MG EC tablet Take 1 tablet (75 mg total) by mouth 2 (two) times daily. 06/15/22   Wallene Huh, DPM  hydrocortisone (ANUSOL-HC) 2.5 % rectal cream Place 1 application. rectally 2 (two) times daily as needed for hemorrhoids. 01/09/22   Vladimir Crofts, PA-C  hydroxychloroquine (PLAQUENIL) 200 MG tablet Take 1 tablet (200 mg total) by mouth daily. 02/07/22   Rice, Resa Miner, MD  linaclotide Rolan Lipa) 290 MCG CAPS capsule Take 1 capsule (290 mcg total) by mouth daily before breakfast. 02/28/22    Armbruster, Carlota Raspberry, MD  metroNIDAZOLE (FLAGYL) 500 MG tablet Take 1 tablet (500 mg total) by mouth 2 (two) times daily. 03/23/22   Shelly Bombard, MD  norgestimate-ethinyl estradiol (ORTHO-CYCLEN) 0.25-35 MG-MCG tablet Take 1 tablet by mouth daily.    [provider]  omeprazole (PRILOSEC) 40 MG capsule Take 1 capsule (40 mg total) by mouth daily. 02/28/22   Armbruster, Carlota Raspberry, MD  polyethylene glycol (MIRALAX / GLYCOLAX) 17 g packet Take 17 g by mouth 2 (two) times daily. Increase as needed    [provider]  Rectal Protectant-Emollient (CALMOL-4) 76-10 % SUPP Use as directed once to twice daily 02/28/22   Armbruster, Carlota Raspberry, MD  triamcinolone cream (KENALOG) 0.1 % Apply 1 Application topically 2 (two) times daily. 07/09/22   Dorna Mai, MD      Allergies    Shellfish-derived products and Raspberry    Review of Systems   Review of Systems  All other systems reviewed and are negative.   Physical Exam Updated Vital Signs BP 112/76 (BP Location: Right Arm)   Pulse (!) 123   Temp 100.1 F (37.8 C) (Oral)   Resp 16   Ht 4\' 11"  (1.499 m)   Wt 45.4 kg   LMP 01/02/2023 (Exact Date)   SpO2 100%   BMI 20.20 kg/m  Physical Exam Vitals and nursing note reviewed.  Constitutional:      General: She is not in acute distress.    Appearance: She is  well-developed. She is ill-appearing.  HENT:     Head: Normocephalic and atraumatic.     Mouth/Throat:     Mouth: Mucous membranes are dry.  Eyes:     Conjunctiva/sclera: Conjunctivae normal.  Cardiovascular:     Rate and Rhythm: Regular rhythm. Tachycardia present.  Pulmonary:     Effort: Pulmonary effort is normal. No respiratory distress.     Breath sounds: Normal breath sounds.  Abdominal:     Palpations: Abdomen is soft.     Tenderness: There is no abdominal tenderness. There is left CVA tenderness.     Comments: LLQ TTP  Genitourinary:    Comments: Declined by patient Musculoskeletal:        General:  No swelling.     Cervical back: Neck supple.  Skin:    General: Skin is warm and dry.     Capillary Refill: Capillary refill takes less than 2 seconds.  Neurological:     General: No focal deficit present.     Mental Status: She is alert and oriented to person, place, and time.  Psychiatric:        Mood and Affect: Mood normal.     ED Results / Procedures / Treatments   Labs (all labs ordered are listed, but only abnormal results are displayed) Labs Reviewed  WET PREP, GENITAL - Abnormal; Notable for the following components:      Result Value   Clue Cells Wet Prep HPF POC PRESENT (*)    All other components within normal limits  URINALYSIS, ROUTINE W REFLEX MICROSCOPIC - Abnormal; Notable for the following components:   Specific Gravity, Urine >1.030 (*)    Hgb urine dipstick LARGE (*)    Bilirubin Urine SMALL (*)    Protein, ur 100 (*)    Nitrite POSITIVE (*)    Leukocytes,Ua TRACE (*)    All other components within normal limits  CBC WITH DIFFERENTIAL/PLATELET - Abnormal; Notable for the following components:   WBC 3.9 (*)    MCV 79.3 (*)    All other components within normal limits  BASIC METABOLIC PANEL - Abnormal; Notable for the following components:   CO2 21 (*)    Calcium 8.2 (*)    All other components within normal limits  URINALYSIS, MICROSCOPIC (REFLEX) - Abnormal; Notable for the following components:   Bacteria, UA FEW (*)    All other components within normal limits  RESP PANEL BY RT-PCR (RSV, FLU A&B, COVID)  [id number]  CULTURE, BLOOD (ROUTINE X 2)  CULTURE, BLOOD (ROUTINE X 2)  URINE CULTURE  PREGNANCY, URINE  LACTIC ACID, PLASMA  HIV ANTIBODY (ROUTINE TESTING W REFLEX)  RPR  GC/CHLAMYDIA PROBE AMP ([place]) NOT AT [facility]    EKG None  Radiology CT ABDOMEN PELVIS W CONTRAST  Result Date: 01/19/2023 CLINICAL DATA:  Abdominal and flank pain.  Stone suspected.  Sepsis. EXAM: CT ABDOMEN AND PELVIS WITH CONTRAST TECHNIQUE: Multidetector CT imaging  of the abdomen and pelvis was performed using the standard protocol following bolus administration of intravenous contrast. RADIATION DOSE REDUCTION: This exam was performed according to the departmental dose-optimization program which includes automated exposure control, adjustment of the mA and/or kV according to patient size and/or use of iterative reconstruction technique. CONTRAST:  167mL OMNIPAQUE IOHEXOL 300 MG/ML  SOLN COMPARISON:  09/01/2022 stone study FINDINGS: Mild to moderate motion degradation throughout. Lower chest: Not imaged Hepatobiliary: Superior liver not imaged. No significant abnormality in the liver, gallbladder, biliary tract. Pancreas: Normal, without mass or ductal  dilatation. Spleen: Spleen only partially imaged.  Normal imaged portions. Adrenals/Urinary Tract: Normal adrenal glands. Normal kidneys, without hydronephrosis. Normal urinary bladder. Stomach/Bowel: Normal stomach, without wall thickening. Colonic stool burden suggests constipation. Normal terminal ileum. The appendix may be identified laterally on 41/2. No pericecal inflammation identified. Normal small bowel. Vascular/Lymphatic: Normal caliber of the aorta and branch vessels. No abdominopelvic adenopathy. Reproductive: Normal uterus and adnexa. Other: Pelvic cul-de-sac and right adnexal small volume fluid is likely physiologic or could relate to recent follicle/cyst rupture. No free intraperitoneal air. Musculoskeletal: No acute osseous abnormality. IMPRESSION: 1. Mild to moderately motion degraded exam. 2. No gross explanation for patient's symptoms. 3. Possible visualization of the appendix. If high clinical concern of appendicitis, consider repeat when patient is able to breath hold and follow directions. 4.  Possible constipation. Electronically Signed   By: Abigail Miyamoto M.D.   On: 01/19/2023 10:03    Procedures .Critical Care  Performed by: Regan Lemming, MD Authorized by: Regan Lemming, MD   Critical care  provider statement:    Critical care time (minutes):  30   Critical care was time spent personally by me on the following activities:  Development of treatment plan with patient or surrogate, discussions with consultants, evaluation of patient's response to treatment, examination of patient, ordering and review of laboratory studies, ordering and review of radiographic studies, ordering and performing treatments and interventions, pulse oximetry, re-evaluation of patient's condition and review of old charts     Medications Ordered in ED Medications  lactated ringers infusion ( Intravenous New Bag/Given 01/19/23 1000)  cefTRIAXone (ROCEPHIN) 2 g in sodium chloride 0.9 % 100 mL IVPB (0 g Intravenous Stopped 01/19/23 1028)  lactated ringers bolus 1,000 mL (0 mLs Intravenous Stopped 01/19/23 0959)  acetaminophen (TYLENOL) tablet 1,000 mg (1,000 mg Oral Given 01/19/23 0831)  ondansetron (ZOFRAN) injection 4 mg (4 mg Intravenous Given 01/19/23 0831)  iohexol (OMNIPAQUE) 300 MG/ML solution 100 mL (100 mLs Intravenous Contrast Given 01/19/23 0935)  lactated ringers bolus 500 mL (0 mLs Intravenous Stopped 01/19/23 1104)  metroNIDAZOLE (FLAGYL) tablet 500 mg (500 mg Oral Given 01/19/23 1104)    ED Course/ Medical Decision Making/ A&P Clinical Course as of 01/19/23 1109  Sat Jan 19, 2023  0819 Pulse Rate(!): 124 [JL]  0900 WBC, UA: 6-10 [JL]  0900 Bacteria, UA(!): FEW [JL]  0900 Hgb urine dipstick(!): LARGE [JL]  0900 Nitrite(!): POSITIVE [JL]  0900 Leukocytes,Ua(!): TRACE [JL]  0900 Temp(!): 100.8 F (38.2 C) [JL]  0901 Pulse Rate(!): 108 [JL]  0952 Clue Cells Wet Prep HPF POC(!): PRESENT [JL]    Clinical Course User Index [JL] Regan Lemming, MD                             Medical Decision Making Amount and/or Complexity of Data Reviewed Labs: ordered. Decision-making details documented in ED Course. Radiology: ordered.  Risk OTC drugs. Prescription drug management. Decision regarding  hospitalization.    22 year old female with medical history significant for asthma and constipation who presents to the emergency department with dysuria, back pain/flank pain and generalized bodyaches.  The patient states that over the last few days she has had dysuria which has subsequently developed into chills, generalized bodyaches and left-sided flank pain.  She states it feels similar to when she last had a urinary tract infection.  She endorses some vaginal discharge, denies any vaginal bleeding.  She is not concerned for STI and declines pelvic exam.  She endorses fevers.  She also states that she is having NBNB emesis and has been unable to keep anything down.  On arrival, the patient was febrile to 100.8, tachycardic heart rate 124, BP 100/75, saturating 99% on room air.  Sinus tachycardia noted on cardiac telemetry.  Physical exam significant for left-sided CVA tenderness, sinus tachycardia, no abdominal tenderness to palpation.  No evidence for PID.  Patient did endorse some vaginal discharge.  Considered STI however patient declines pelvic.  She does consent to STI testing.  HIV, RPR, GC/chlamydia and wet prep were collected through blood work and vaginal self swab.  Patient symptoms are most consistent with pyelonephritis from UTI.  Feels well wet prep revealed clue cells for which we will treat with oral Flagyl.  IV access was obtained and the patient was fluid resuscitated with 30 cc/kg of LR, covered for pyelonephritis with Rocephin IV.  CBC without a leukocytosis, leukopenia to 3.9 present, anemia to 12.8 present, BMP without significant electrolyte abnormality, normal renal function.  Urine pregnancy negative, COVID-19, influenza, RSV PCR testing negative, urinalysis with positive nitrites, trace leukocytes, bacteria present, WBCs present.  CT abdomen pelvis revealed no acute abdominal or pelvic abnormality.  Patient meeting SIRS criteria, code sepsis had been called on patient arrival,  initial lactic acid normal, appears volume resuscitated following fluids, continues to remain tachycardic with soft blood pressures, recommended admission for observation in the setting of sepsis from likely pyelonephritis and UTI given the patient's dysuria and CVA tenderness and pain.  Patient consented to admission, hospitalist medicine consulted for admission, Dr. Renaee Munda accepting.   Final Clinical Impression(s) / ED Diagnoses Final diagnoses:  Sepsis, due to unspecified organism, unspecified whether acute organ dysfunction present ([place])  Pyelonephritis  BV (bacterial vaginosis)    Rx / DC Orders ED Discharge Orders     None         Regan Lemming, MD 01/19/23 1109

## 2023-01-19 NOTE — ED Triage Notes (Signed)
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 Pt ambulatory to triage w/steady gait, NAD noted.

## 2023-01-19 NOTE — ED Notes (Signed)
,ED TO INPATIENT HANDOFF REPORT  ED Nurse Name and Phone #: [phone]  S Name/Age/Gender [name] 22 y.o. female Room/Bed: WA18/WA18  Code Status   Code Status: Prior  Home/SNF/Other Home Patient oriented to: self, place, time, and situation Is this baseline? 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 Pt ambulatory to triage w/steady gait, NAD noted.    Allergies Allergies  Allergen Reactions   Shellfish-Derived Products    Raspberry Itching and Rash    Level of Care/Admitting Diagnosis ED Disposition     ED Disposition  Admit   Condition  --   Comment  The patient appears reasonably stabilized for admission considering the current resources, flow, and capabilities available in the ED at this time, and I doubt any other [facility] requiring further screening and/or treatment in the ED prior to admission is  present.          B Medical/Surgery History Past Medical History:  Diagnosis Date   Asthma    Constipation    History reviewed. No pertinent surgical history.   A IV Location/Drains/Wounds Patient Lines/Drains/Airways Status     Active Line/Drains/Airways     Name Placement date Placement time Site Days   Peripheral IV 01/19/23 20 G Left Antecubital 01/19/23  [id number]  Antecubital  less than 1            Intake/Output Last 24 hours  Intake/Output Summary (Last 24 hours) at 01/19/2023 1201 Last data filed at 01/19/2023 1104 Gross per 24 hour  Intake 1600 ml  Output --  Net 1600 ml    Labs/Imaging Results for orders placed or performed during the hospital encounter of 01/19/23 (from the past 48 hour(s))  Resp panel by RT-PCR (RSV, Flu A&B, Covid) Anterior Nasal Swab     Status: None   Collection Time: 01/19/23  6:40 AM    Specimen: Anterior Nasal Swab  Result Value Ref Range   SARS Coronavirus 2 by RT PCR NEGATIVE NEGATIVE    Comment: (NOTE) SARS-CoV-2 target nucleic acids are NOT DETECTED.  The SARS-CoV-2 RNA is generally detectable in upper respiratory specimens during the acute phase of infection. The lowest concentration of SARS-CoV-2 viral copies this assay can detect is 138 copies/mL. A negative result does not preclude SARS-Cov-2 infection and should not be used as the sole basis for treatment or other patient management decisions. A negative result may occur with  improper specimen collection/handling, submission of specimen other than nasopharyngeal swab, presence of viral mutation(s) within the areas targeted by this assay, and inadequate number of viral copies(<138 copies/mL). A negative result must be combined with clinical observations, patient history, and epidemiological information. The expected result is Negative.  Fact Sheet for Patients:  EntrepreneurPulse.com.au  Fact Sheet for Healthcare Providers:  IncredibleEmployment.be  This test is no t yet approved or cleared by the [place] FDA and  has been authorized for detection and/or diagnosis of SARS-CoV-2 by FDA under an Emergency Use Authorization (EUA). This EUA will remain  in effect (meaning this test can be used) for the duration of the COVID-19 declaration under Section 564(b)(1) of the Act, 21 U.S.C.section 360bbb-3(b)(1), unless the authorization is terminated  or revoked sooner.       Influenza A by PCR NEGATIVE NEGATIVE   Influenza B by  PCR NEGATIVE NEGATIVE    Comment: (NOTE) The Xpert Xpress SARS-CoV-2/FLU/RSV plus assay is intended as an aid in the diagnosis of influenza from Nasopharyngeal swab specimens and should not be used as a sole basis for treatment. Nasal washings and aspirates are unacceptable for Xpert Xpress SARS-CoV-2/FLU/RSV testing.  Fact Sheet for  Patients: EntrepreneurPulse.com.au  Fact Sheet for Healthcare Providers: IncredibleEmployment.be  This test is not yet approved or cleared by the [place] FDA and has been authorized for detection and/or diagnosis of SARS-CoV-2 by FDA under an Emergency Use Authorization (EUA). This EUA will remain in effect (meaning this test can be used) for the duration of the COVID-19 declaration under Section 564(b)(1) of the Act, 21 U.S.C. section 360bbb-3(b)(1), unless the authorization is terminated or revoked.     Resp Syncytial Virus by PCR NEGATIVE NEGATIVE    Comment: (NOTE) Fact Sheet for Patients: EntrepreneurPulse.com.au  Fact Sheet for Healthcare Providers: IncredibleEmployment.be  This test is not yet approved or cleared by the [place] FDA and has been authorized for detection and/or diagnosis of SARS-CoV-2 by FDA under an Emergency Use Authorization (EUA). This EUA will remain in effect (meaning this test can be used) for the duration of the COVID-19 declaration under Section 564(b)(1) of the Act, 21 U.S.C. section 360bbb-3(b)(1), unless the authorization is terminated or revoked.  Performed at [facility], [place] [address], [place], [place] [address]   Pregnancy, urine     Status: None   Collection Time: 01/19/23  6:43 AM  Result Value Ref Range   Preg Test, Ur NEGATIVE NEGATIVE    Comment:        THE SENSITIVITY OF THIS METHODOLOGY IS >20 mIU/mL. Performed at [facility], [place] [address] [place], [place] [address]   Urinalysis, Routine w reflex microscopic -Urine, Clean Catch     Status: Abnormal   Collection Time: 01/19/23  6:43 AM  Result Value Ref Range   Color, Urine YELLOW YELLOW   APPearance CLEAR CLEAR   Specific Gravity, Urine >1.030 (H) 1.005 - 1.030   pH 5.5 5.0 - 8.0   Glucose, UA NEGATIVE NEGATIVE mg/dL   Hgb urine dipstick LARGE (A)  NEGATIVE   Bilirubin Urine SMALL (A) NEGATIVE   Ketones, ur NEGATIVE NEGATIVE mg/dL   Protein, ur 100 (A) NEGATIVE mg/dL   Nitrite POSITIVE (A) NEGATIVE   Leukocytes,Ua TRACE (A) NEGATIVE    Comment: Performed at [facility], [place] [address], [place], [place] [address]  Urinalysis, Microscopic (reflex)     Status: Abnormal   Collection Time: 01/19/23  6:43 AM  Result Value Ref Range   RBC / HPF 11-20 0 - 5 RBC/hpf   WBC, UA 6-10 0 - 5 WBC/hpf   Bacteria, UA FEW (A) NONE SEEN   Squamous Epithelial / HPF 0-5 0 - 5 /HPF   Mucus PRESENT    Urine-Other MICROSCOPIC EXAM PERFORMED ON UNCONCENTRATED URINE     Comment: Performed at [facility], [place] [address], [place], [place] [address]  Blood culture (routine x 2)     Status: None (Preliminary result)   Collection Time: 01/19/23  8:19 AM   Specimen: Left Antecubital; Blood  Result Value Ref Range   Specimen Description LEFT ANTECUBITAL BLOOD    Special Requests      Blood Culture adequate volume BOTTLES DRAWN AEROBIC AND ANAEROBIC Performed at [facility], [place] [address], [place], [place] [address]    Culture PENDING  Report Status PENDING   CBC with Differential     Status: Abnormal   Collection Time: 01/19/23  8:19 AM  Result Value Ref Range   WBC 3.9 (L) 4.0 - 10.5 K/uL   RBC 4.63 3.87 - 5.11 MIL/uL   Hemoglobin 12.8 12.0 - 15.0 g/dL   HCT 36.7 36.0 - 46.0 %   MCV 79.3 (L) 80.0 - 100.0 fL   MCH 27.6 26.0 - 34.0 pg   MCHC 34.9 30.0 - 36.0 g/dL   RDW 12.5 11.5 - 15.5 %   Platelets 171 150 - 400 K/uL   nRBC 0.0 0.0 - 0.2 %   Neutrophils Relative % 74 %   Neutro Abs 2.9 1.7 - 7.7 K/uL   Lymphocytes Relative 18 %   Lymphs Abs 0.7 0.7 - 4.0 K/uL   Monocytes Relative 7 %   Monocytes Absolute 0.3 0.1 - 1.0 K/uL   Eosinophils Relative 0 %   Eosinophils Absolute 0.0 0.0 - 0.5 K/uL   Basophils Relative 1 %   Basophils Absolute 0.0 0.0 - 0.1 K/uL   Immature Granulocytes 0 %   Abs  Immature Granulocytes 0.01 0.00 - 0.07 K/uL    Comment: Performed at [facility], [place] [address], [place], [place] [id number]  Basic metabolic panel     Status: Abnormal   Collection Time: 01/19/23  8:19 AM  Result Value Ref Range   Sodium 135 135 - 145 mmol/L   Potassium 3.7 3.5 - 5.1 mmol/L   Chloride 106 98 - 111 mmol/L   CO2 21 (L) 22 - 32 mmol/L   Glucose, Bld 81 70 - 99 mg/dL    Comment: Glucose reference range applies only to samples taken after fasting for at least 8 hours.   BUN 13 6 - 20 mg/dL   Creatinine, Ser 0.84 0.44 - 1.00 mg/dL   Calcium 8.2 (L) 8.9 - 10.3 mg/dL   GFR, Estimated >60 >60 mL/min    Comment: (NOTE) Calculated using the CKD-EPI Creatinine Equation (2021)    Anion gap 8 5 - 15    Comment: Performed at [facility], [place] [address], [place], [place] [address]  Lactic acid, plasma     Status: None   Collection Time: 01/19/23  8:19 AM  Result Value Ref Range   Lactic Acid, Venous 0.9 0.5 - 1.9 mmol/L    Comment: Performed at [facility], [place] [address], [place], [place] [address]  Blood culture (routine x 2)     Status: None (Preliminary result)   Collection Time: 01/19/23  8:37 AM   Specimen: Right Antecubital; Blood  Result Value Ref Range   Specimen Description RIGHT ANTECUBITAL BLOOD    Special Requests      Blood Culture results may not be optimal due to an inadequate volume of blood received in culture bottles BOTTLES DRAWN AEROBIC AND ANAEROBIC Performed at [place] [address] [place], [place] [address]    Culture PENDING    Report Status PENDING   Wet prep, genital     Status: Abnormal   Collection Time: 01/19/23  8:46 AM   Specimen: Vaginal  Result Value Ref Range   Yeast Wet Prep HPF POC NONE SEEN NONE SEEN   Trich, Wet Prep NONE SEEN NONE SEEN   Clue Cells Wet Prep HPF POC PRESENT (A) NONE SEEN   WBC, Wet Prep HPF POC <10 <10   Sperm NONE SEEN     Comment:  Performed at [facility], [place] [address], [place], [place] [address]   CT ABDOMEN PELVIS W CONTRAST  Result Date: 01/19/2023 CLINICAL DATA:  Abdominal and flank pain.  Stone suspected.  Sepsis. EXAM: CT ABDOMEN AND PELVIS WITH CONTRAST TECHNIQUE: Multidetector CT imaging of the abdomen and pelvis was performed using the standard protocol following bolus administration of intravenous contrast. RADIATION DOSE REDUCTION: This exam was performed according to the departmental dose-optimization program which includes automated exposure control, adjustment of the mA and/or kV according to patient size and/or use of iterative reconstruction technique. CONTRAST:  159mL OMNIPAQUE IOHEXOL 300 MG/ML  SOLN COMPARISON:  09/01/2022 stone study FINDINGS: Mild to moderate motion degradation throughout. Lower chest: Not imaged Hepatobiliary: Superior liver not imaged. No significant abnormality in the liver, gallbladder, biliary tract. Pancreas: Normal, without mass or ductal dilatation. Spleen: Spleen only partially imaged.  Normal imaged portions. Adrenals/Urinary Tract: Normal adrenal glands. Normal kidneys, without hydronephrosis. Normal urinary bladder. Stomach/Bowel: Normal stomach, without wall thickening. Colonic stool burden suggests constipation. Normal terminal ileum. The appendix may be identified laterally on 41/2. No pericecal inflammation identified. Normal small bowel. Vascular/Lymphatic: Normal caliber of the aorta and branch vessels. No abdominopelvic adenopathy. Reproductive: Normal uterus and adnexa. Other: Pelvic cul-de-sac and right adnexal small volume fluid is likely physiologic or could relate to recent follicle/cyst rupture. No free intraperitoneal air. Musculoskeletal: No acute osseous abnormality. IMPRESSION: 1. Mild to moderately motion degraded exam. 2. No gross explanation for patient's symptoms. 3. Possible visualization of the appendix. If high clinical concern of appendicitis,  consider repeat when patient is able to breath hold and follow directions. 4.  Possible constipation. Electronically Signed   By: Abigail Miyamoto M.D.   On: 01/19/2023 10:03    Pending Labs Unresulted Labs (From admission, onward)     Start     Ordered   01/19/23 [id number]  HIV Antibody (routine testing w rflx)  (HIV Antibody (Routine testing w reflex) panel)  Once,   URGENT        01/19/23 0821   01/19/23 0822  RPR  Once,   URGENT        01/19/23 0821   01/19/23 0757  Urine Culture  Once,   URGENT       Question:  Indication  Answer:  Flank Pain   01/19/23 0756            Vitals/Pain Today's Vitals   01/19/23 0830 01/19/23 1000 01/19/23 1104 01/19/23 1115  BP: 103/73 112/76  107/76  Pulse: (!) 108 (!) 123  (!) 104  Resp: 16 16  16   Temp: (!) 100.8 F (38.2 C) 100.1 F (37.8 C)    TempSrc: Oral Oral    SpO2: 100% 100%  100%  Weight:      Height:      PainSc:   5      Isolation Precautions No active isolations  Medications Medications  lactated ringers infusion ( Intravenous New Bag/Given 01/19/23 1000)  cefTRIAXone (ROCEPHIN) 2 g in sodium chloride 0.9 % 100 mL IVPB (0 g Intravenous Stopped 01/19/23 1028)  lactated ringers bolus 1,000 mL (0 mLs Intravenous Stopped 01/19/23 0959)  acetaminophen (TYLENOL) tablet 1,000 mg (1,000 mg Oral Given 01/19/23 0831)  ondansetron (ZOFRAN) injection 4 mg (4 mg Intravenous Given 01/19/23 0831)  iohexol (OMNIPAQUE) 300 MG/ML solution 100 mL (100 mLs Intravenous Contrast Given 01/19/23 0935)  lactated ringers bolus 500 mL (0 mLs Intravenous Stopped 01/19/23 1104)  metroNIDAZOLE (FLAGYL) tablet 500 mg (500 mg  Oral Given 01/19/23 1104)    Mobility walks     Focused Assessments Renal Assessment Handoff: Dysuria, flank pain, back pain fever nausea   R Recommendations: See Admitting Provider Note  Report given to:   Additional Notes: [id number]

## 2023-01-19 NOTE — Progress Notes (Signed)
Elink following for sepsis protocol. 

## 2023-01-20 DIAGNOSIS — I7300 Raynaud's syndrome without gangrene: Secondary | ICD-10-CM | POA: Diagnosis not present

## 2023-01-20 DIAGNOSIS — N10 Acute tubulo-interstitial nephritis: Secondary | ICD-10-CM

## 2023-01-20 DIAGNOSIS — J4520 Mild intermittent asthma, uncomplicated: Secondary | ICD-10-CM | POA: Diagnosis not present

## 2023-01-20 DIAGNOSIS — A419 Sepsis, unspecified organism: Secondary | ICD-10-CM | POA: Diagnosis not present

## 2023-01-20 LAB — CBC
HCT: 32.8 % — ABNORMAL LOW (ref 36.0–46.0)
Hemoglobin: 11.6 g/dL — ABNORMAL LOW (ref 12.0–15.0)
MCH: 28 pg (ref 26.0–34.0)
MCHC: 35.4 g/dL (ref 30.0–36.0)
MCV: 79.2 fL — ABNORMAL LOW (ref 80.0–100.0)
Platelets: 151 10*3/uL (ref 150–400)
RBC: 4.14 MIL/uL (ref 3.87–5.11)
RDW: 12.5 % (ref 11.5–15.5)
WBC: 3.7 10*3/uL — ABNORMAL LOW (ref 4.0–10.5)
nRBC: 0 % (ref 0.0–0.2)

## 2023-01-20 LAB — BASIC METABOLIC PANEL
Anion gap: 9 (ref 5–15)
BUN: 9 mg/dL (ref 6–20)
CO2: 21 mmol/L — ABNORMAL LOW (ref 22–32)
Calcium: 8 mg/dL — ABNORMAL LOW (ref 8.9–10.3)
Chloride: 105 mmol/L (ref 98–111)
Creatinine, Ser: 0.81 mg/dL (ref 0.44–1.00)
GFR, Estimated: >60 mL/min (ref >60)
Glucose, Bld: 103 mg/dL — ABNORMAL HIGH (ref 70–99)
Potassium: 3.5 mmol/L (ref 3.5–5.1)
Sodium: 135 mmol/L (ref 135–145)

## 2023-01-20 LAB — URINE CULTURE

## 2023-01-20 LAB — RPR: RPR Ser Ql: NONREACTIVE

## 2023-01-20 MED ORDER — HYDROCORTISONE (PERIANAL) 2.5 % EX CREA
TOPICAL_CREAM | Freq: Two times a day (BID) | CUTANEOUS | Status: DC
  Filled 2023-01-20: qty 28.35

## 2023-01-20 MED ORDER — ACETAMINOPHEN 650 MG RE SUPP: 650.0000 mg | Freq: Four times a day (QID) | RECTAL | Status: DC | PRN

## 2023-01-20 MED ORDER — LACTATED RINGERS IV SOLN: INTRAVENOUS | Status: DC

## 2023-01-20 MED ORDER — IBUPROFEN 200 MG PO TABS
200.0000 mg | ORAL_TABLET | Freq: Once | ORAL | Status: AC | PRN
  Administered 2023-01-20: 200 mg via ORAL
  Filled 2023-01-20: qty 1

## 2023-01-20 MED ORDER — OXYCODONE HCL 5 MG PO TABS: 5.0000 mg | ORAL_TABLET | Freq: Four times a day (QID) | ORAL | Status: DC | PRN

## 2023-01-20 MED ORDER — ACETAMINOPHEN 325 MG PO TABS: 650.0000 mg | ORAL_TABLET | Freq: Four times a day (QID) | ORAL | Status: DC | PRN

## 2023-01-20 NOTE — Progress Notes (Signed)
Sent J. Olena Heckle, NP, the following message:  [name] Olena Heckle... So, this pt has gone into the RED MEWS.... Temp 102.9-103, heart rate 110-118, BP stable at 103/61 and respirations 18-20. Sats 100% on room air. No visible distress. Gave her Tylenol and incentive spirometer to use. I did see where they did an ANA test on her and it is positive, along with WBC of 3.3 (which has decreased from 3.9). They do have her on Rocephin IV every 24 hours.   Sandra Essex, NP then sent the following message: hey, I would also add some icepacks under knees , and behind neck and covers off and room cold if you haven;t   Shared with him that ice packs were placed and room cooled.  Reverse isolation is in placed, as he stated that this was fine.

## 2023-01-20 NOTE — Progress Notes (Signed)
  Progress Note   Patient: Sandra Vance [id number] DOB: 07-Sep-2001 DOA: 01/19/2023     1 DOS: the patient was seen and examined on 01/20/2023   Brief hospital course: 22 year old woman PMH uncomplicated, presented with back and flank pain, dysuria, generalized bodyaches.  Admitted for sepsis secondary to pyelonephritis.  Assessment and Plan: Sepsis secondary to UTI [facility]) Pyelonephritis Met criteria with fever, tachycardia.  Lactic acid normal. Clinical diagnosis of UTI and pyelonephritis based on history and examination. Imaging unrevealing. Febrile but hemodynamics stable. Leukopenic.  Continue empiric IV Rocephin Follow-up blood and urine cultures   Asthma in adult, mild intermittent, uncomplicated Stable. Continue bronchodilator.  Raynaud's syndrome without gangrene Continue hydroxychloroquine     Subjective:  Feels better but still has left flank pain Has had fever Poor appetite PMH kidney infections Works here at [employer]: Vitals:   01/20/23 0312 01/20/23 0451 01/20/23 0910 01/20/23 1249  BP:  (!) 101/56 (!) 99/56 97/85  Pulse:  (!) 117 (!) 122 93  Resp:  16 17 16   Temp: (!) 102.9 F (39.4 C) (!) 101.8 F (38.8 C) 100.2 F (37.9 C) 100.2 F (37.9 C)  TempSrc: Oral Oral Oral Oral  SpO2:  99% 99% 93%  Weight:      Height:       Physical Exam Vitals reviewed.  Constitutional:      General: She is not in acute distress.    Appearance: She is not ill-appearing or toxic-appearing.  Cardiovascular:     Rate and Rhythm: Regular rhythm. Tachycardia present.     Heart sounds: No murmur heard. Pulmonary:     Effort: Pulmonary effort is normal. No respiratory distress.     Breath sounds: No wheezing, rhonchi or rales.  Abdominal:     Palpations: Abdomen is soft.     Tenderness: There is no abdominal tenderness.     Comments: Mild left CVA pain  Neurological:     Mental Status: She is alert.  Psychiatric:        Mood and Affect: Mood normal.         Behavior: Behavior normal.     Data Reviewed: BMP noted WBC 3.7, stable Hgb 11.6, microcytic  Family Communication: none requested  Disposition: Status is: Inpatient Remains inpatient appropriate because: acute pyelonephritis  Planned Discharge Destination: Home    Time spent: 25 minutes  Author: Murray Hodgkins, MD 01/20/2023 3:40 PM  For on call review [email or web address].

## 2023-01-20 NOTE — Progress Notes (Signed)
Clarene Essex, NP came up to unit, in regards to this pt. He had been informed of pt's temp. Orders received. Ibuprofen was administered as ordered. Pt remains warm, dry,no visible distress. No c/o pain.

## 2023-01-20 NOTE — Hospital Course (Addendum)
22 year old woman PMH uncomplicated, presented with back and flank pain, dysuria, generalized bodyaches.  Admitted for sepsis secondary to pyelonephritis.

## 2023-01-20 NOTE — Progress Notes (Signed)
       Overnight   NAME: Sandra Vance MRN: [id number] DOB : [birth year]    Date of Service   01/20/2023   HPI/Events of Note   Notified by RN for hyperthermia of 102.9 F Ordered Ibuprofen and continuance of cooling measure.   Interventions/ Plan   Continue cooling measures Ibuprofen Tylenol again when available by order time       Gershon Cull BSN MSNA MSN [place]

## 2023-01-21 DIAGNOSIS — J4520 Mild intermittent asthma, uncomplicated: Secondary | ICD-10-CM | POA: Diagnosis not present

## 2023-01-21 DIAGNOSIS — N10 Acute tubulo-interstitial nephritis: Secondary | ICD-10-CM | POA: Diagnosis not present

## 2023-01-21 DIAGNOSIS — A419 Sepsis, unspecified organism: Secondary | ICD-10-CM | POA: Diagnosis not present

## 2023-01-21 DIAGNOSIS — N390 Urinary tract infection, site not specified: Secondary | ICD-10-CM | POA: Diagnosis not present

## 2023-01-21 LAB — GC/CHLAMYDIA PROBE AMP (~~LOC~~) NOT AT ARMC
Chlamydia: NEGATIVE
Comment: NEGATIVE
Comment: NORMAL
Neisseria Gonorrhea: NEGATIVE

## 2023-01-21 MED ORDER — METRONIDAZOLE 500 MG PO TABS: 500.0000 mg | ORAL_TABLET | Freq: Two times a day (BID) | ORAL | 0 refills | Status: AC

## 2023-01-21 MED ORDER — METRONIDAZOLE 500 MG PO TABS: 500.0000 mg | ORAL_TABLET | Freq: Two times a day (BID) | ORAL | Status: DC

## 2023-01-21 MED ORDER — CEFADROXIL 500 MG PO CAPS: 1000.0000 mg | ORAL_CAPSULE | Freq: Two times a day (BID) | ORAL | 0 refills | Status: AC

## 2023-01-21 NOTE — Plan of Care (Signed)

## 2023-01-21 NOTE — Discharge Summary (Addendum)
Physician Discharge Summary   Patient: Sandra Vance MRN: [id number] DOB: [birth year]  Admit date:     01/19/2023  Discharge date: 01/21/23  Discharge Physician: Murray Hodgkins   PCP: Dorna Mai, MD   Recommendations at discharge:  Resolution of sepsis, pyelonephritis  Discharge Diagnoses: Principal Problem:   Sepsis secondary to UTI Active Problems:   Acute pyelonephritis   Fever   Asthma in adult, mild intermittent, uncomplicated   Raynaud's syndrome without gangrene   Tachycardia   GERD (gastroesophageal reflux disease)  Resolved Problems:   * No resolved hospital problems. *  Hospital Course: 22 year old woman PMH uncomplicated, presented with back and flank pain, dysuria, generalized bodyaches.  Admitted for sepsis secondary to pyelonephritis.  Condition rapidly improved on IV antibiotics.  Discharged home in good condition.  Sepsis secondary to UTI [facility]) Pyelonephritis Met criteria with fever, tachycardia.  Lactic acid normal. Clinical diagnosis of UTI and pyelonephritis based on history and examination. Imaging unrevealing.  Urine culture unrevealing.  Blood cultures no growth thus far.  Now afebrile more than 24 hours. Transition to oral antibiotics and complete course as an outpatient.  BV Treat with Flagyl.   Asthma in adult, mild intermittent, uncomplicated Stable. Continue bronchodilator.   Raynaud's syndrome without gangrene Continue hydroxychloroquine       Consultants:  None  Procedures performed:  None   Disposition: Home  Diet recommendation:  Regular diet  DISCHARGE MEDICATION: Allergies as of 01/21/2023       Reactions   Shellfish-derived Products Swelling   Raspberry Itching, Rash        Medication List     STOP taking these medications    cefdinir 300 MG capsule Commonly known as: OMNICEF   diclofenac 75 MG EC tablet Commonly known as: VOLTAREN   linaclotide 290 MCG Caps capsule Commonly known as: LINZESS    omeprazole 40 MG capsule Commonly known as: PRILOSEC       TAKE these medications    Calmol-4 76-10 % Supp Use as directed once to twice daily   cefadroxil 500 MG capsule Commonly known as: DURICEF Take 2 capsules (1,000 mg total) by mouth 2 (two) times daily for 8 days.   cyclobenzaprine 5 MG tablet Commonly known as: FLEXERIL Take 1 tablet (5 mg total) by mouth at bedtime as needed for muscle spasms.   hydrocortisone 2.5 % rectal cream Commonly known as: ANUSOL-HC Place 1 application. rectally 2 (two) times daily as needed for hemorrhoids.   hydroxychloroquine 200 MG tablet Commonly known as: PLAQUENIL Take 1 tablet (200 mg total) by mouth daily.   metroNIDAZOLE 500 MG tablet Commonly known as: FLAGYL Take 1 tablet (500 mg total) by mouth every 12 (twelve) hours for 7 days. What changed: when to take this   triamcinolone cream 0.1 % Commonly known as: KENALOG Apply 1 Application topically 2 (two) times daily.       Feels better   Discharge Exam: Filed Weights   01/19/23 [id number]  Weight: 45.4 kg   Physical Exam Vitals reviewed.  Constitutional:      General: She is not in acute distress.    Appearance: She is not ill-appearing or toxic-appearing.  Cardiovascular:     Rate and Rhythm: Normal rate and regular rhythm.     Heart sounds: No murmur heard. Pulmonary:     Effort: Pulmonary effort is normal. No respiratory distress.     Breath sounds: No wheezing, rhonchi or rales.  Musculoskeletal:     Comments: Minimal flank pain  Neurological:     Mental Status: She is alert.  Psychiatric:        Mood and Affect: Mood normal.        Behavior: Behavior normal.      Condition at discharge: good  The results of significant diagnostics from this hospitalization (including imaging, microbiology, ancillary and laboratory) are listed below for reference.   Imaging Studies: CT ABDOMEN PELVIS W CONTRAST  Result Date: 01/19/2023 CLINICAL DATA:  Abdominal and  flank pain.  Stone suspected.  Sepsis. EXAM: CT ABDOMEN AND PELVIS WITH CONTRAST TECHNIQUE: Multidetector CT imaging of the abdomen and pelvis was performed using the standard protocol following bolus administration of intravenous contrast. RADIATION DOSE REDUCTION: This exam was performed according to the departmental dose-optimization program which includes automated exposure control, adjustment of the mA and/or kV according to patient size and/or use of iterative reconstruction technique. CONTRAST:  135mL OMNIPAQUE IOHEXOL 300 MG/ML  SOLN COMPARISON:  09/01/2022 stone study FINDINGS: Mild to moderate motion degradation throughout. Lower chest: Not imaged Hepatobiliary: Superior liver not imaged. No significant abnormality in the liver, gallbladder, biliary tract. Pancreas: Normal, without mass or ductal dilatation. Spleen: Spleen only partially imaged.  Normal imaged portions. Adrenals/Urinary Tract: Normal adrenal glands. Normal kidneys, without hydronephrosis. Normal urinary bladder. Stomach/Bowel: Normal stomach, without wall thickening. Colonic stool burden suggests constipation. Normal terminal ileum. The appendix may be identified laterally on 41/2. No pericecal inflammation identified. Normal small bowel. Vascular/Lymphatic: Normal caliber of the aorta and branch vessels. No abdominopelvic adenopathy. Reproductive: Normal uterus and adnexa. Other: Pelvic cul-de-sac and right adnexal small volume fluid is likely physiologic or could relate to recent follicle/cyst rupture. No free intraperitoneal air. Musculoskeletal: No acute osseous abnormality. IMPRESSION: 1. Mild to moderately motion degraded exam. 2. No gross explanation for patient's symptoms. 3. Possible visualization of the appendix. If high clinical concern of appendicitis, consider repeat when patient is able to breath hold and follow directions. 4.  Possible constipation. Electronically Signed   By: Abigail Miyamoto M.D.   On: 01/19/2023 10:03     Microbiology: Results for orders placed or performed during the hospital encounter of 01/19/23  Resp panel by RT-PCR (RSV, Flu A&B, Covid) Anterior Nasal Swab     Status: None   Collection Time: 01/19/23  6:40 AM   Specimen: Anterior Nasal Swab  Result Value Ref Range Status   SARS Coronavirus 2 by RT PCR NEGATIVE NEGATIVE Final    Comment: (NOTE) SARS-CoV-2 target nucleic acids are NOT DETECTED.  The SARS-CoV-2 RNA is generally detectable in upper respiratory specimens during the acute phase of infection. The lowest concentration of SARS-CoV-2 viral copies this assay can detect is 138 copies/mL. A negative result does not preclude SARS-Cov-2 infection and should not be used as the sole basis for treatment or other patient management decisions. A negative result may occur with  improper specimen collection/handling, submission of specimen other than nasopharyngeal swab, presence of viral mutation(s) within the areas targeted by this assay, and inadequate number of viral copies(<138 copies/mL). A negative result must be combined with clinical observations, patient history, and epidemiological information. The expected result is Negative.  Fact Sheet for Patients:  EntrepreneurPulse.com.au  Fact Sheet for Healthcare Providers:  IncredibleEmployment.be  This test is no t yet approved or cleared by the [place] FDA and  has been authorized for detection and/or diagnosis of SARS-CoV-2 by FDA under an Emergency Use Authorization (EUA). This EUA will remain  in effect (meaning this test can be used) for the  duration of the COVID-19 declaration under Section 564(b)(1) of the Act, 21 U.S.C.section 360bbb-3(b)(1), unless the authorization is terminated  or revoked sooner.       Influenza A by PCR NEGATIVE NEGATIVE Final   Influenza B by PCR NEGATIVE NEGATIVE Final    Comment: (NOTE) The Xpert Xpress SARS-CoV-2/FLU/RSV plus assay is intended  as an aid in the diagnosis of influenza from Nasopharyngeal swab specimens and should not be used as a sole basis for treatment. Nasal washings and aspirates are unacceptable for Xpert Xpress SARS-CoV-2/FLU/RSV testing.  Fact Sheet for Patients: EntrepreneurPulse.com.au  Fact Sheet for Healthcare Providers: IncredibleEmployment.be  This test is not yet approved or cleared by the [place] FDA and has been authorized for detection and/or diagnosis of SARS-CoV-2 by FDA under an Emergency Use Authorization (EUA). This EUA will remain in effect (meaning this test can be used) for the duration of the COVID-19 declaration under Section 564(b)(1) of the Act, 21 U.S.C. section 360bbb-3(b)(1), unless the authorization is terminated or revoked.     Resp Syncytial Virus by PCR NEGATIVE NEGATIVE Final    Comment: (NOTE) Fact Sheet for Patients: EntrepreneurPulse.com.au  Fact Sheet for Healthcare Providers: IncredibleEmployment.be  This test is not yet approved or cleared by the [place] FDA and has been authorized for detection and/or diagnosis of SARS-CoV-2 by FDA under an Emergency Use Authorization (EUA). This EUA will remain in effect (meaning this test can be used) for the duration of the COVID-19 declaration under Section 564(b)(1) of the Act, 21 U.S.C. section 360bbb-3(b)(1), unless the authorization is terminated or revoked.  Performed at [facility], [place] [address] [place], [place] [address]   Urine Culture     Status: Abnormal   Collection Time: 01/19/23  6:43 AM   Specimen: Urine, Clean Catch  Result Value Ref Range Status   Specimen Description   Final    URINE, CLEAN CATCH Performed at [facility], [place] [address], [place], [place] [address]    Special Requests   Final    NONE Performed at [facility], [place] [address]  [place], [place] [address]    Culture MULTIPLE SPECIES PRESENT, SUGGEST RECOLLECTION (A)  Final   Report Status 01/20/2023 FINAL  Final  Blood culture (routine x 2)     Status: None (Preliminary result)   Collection Time: 01/19/23  8:19 AM   Specimen: Left Antecubital; Blood  Result Value Ref Range Status   Specimen Description LEFT ANTECUBITAL BLOOD  Final   Special Requests   Final    Blood Culture adequate volume BOTTLES DRAWN AEROBIC AND ANAEROBIC   Culture   Final    NO GROWTH 2 DAYS Performed at [facility], [place] [address], [place], [place] [address]    Report Status PENDING  Incomplete  Blood culture (routine x 2)     Status: None (Preliminary result)   Collection Time: 01/19/23  8:37 AM   Specimen: Right Antecubital; Blood  Result Value Ref Range Status   Specimen Description RIGHT ANTECUBITAL BLOOD  Final   Special Requests   Final    Blood Culture results may not be optimal due to an inadequate volume of blood received in culture bottles BOTTLES DRAWN AEROBIC AND ANAEROBIC   Culture   Final    NO GROWTH 2 DAYS Performed at [place] [address] [place], [place] [address]    Report Status PENDING  Incomplete  Wet prep, genital  Status: Abnormal   Collection Time: 01/19/23  8:46 AM   Specimen: Vaginal  Result Value Ref Range Status   Yeast Wet Prep HPF POC NONE SEEN NONE SEEN Final   Trich, Wet Prep NONE SEEN NONE SEEN Final   Clue Cells Wet Prep HPF POC PRESENT (A) NONE SEEN Final   WBC, Wet Prep HPF POC <10 <10 Final   Sperm NONE SEEN  Final    Comment: Performed at [facility], [place] [address] [place], [place] [address]    Labs: CBC: Recent Labs  Lab 01/19/23 [phone] 01/19/23 1606 01/20/23 0439  WBC 3.9* 3.3* 3.7*  NEUTROABS 2.9  --   --   HGB 12.8 12.3 11.6*  HCT 36.7 34.3* 32.8*  MCV 79.3* 79.8* 79.2*  PLT 171 172 [id number]   Basic Metabolic Panel: Recent Labs  Lab 01/19/23 0819 01/19/23 1606 01/20/23 0439  NA 135   --  135  K 3.7  --  3.5  CL 106  --  105  CO2 21*  --  21*  GLUCOSE 81  --  103*  BUN 13  --  9  CREATININE 0.84 0.75 0.81  CALCIUM 8.2*  --  8.0*    Discharge time spent: less than 30 minutes.  Signed: Murray Hodgkins, MD [facility] 01/21/2023

## 2023-01-21 NOTE — TOC CM/SW Note (Signed)
Transition of Care [facility]) Screening Note  Patient Details  Name: Sandra Vance Date of Birth: [birth year]  Transition of Care [facility]) CM/SW Contact:    Sherie Don, LCSW Phone Number: 01/21/2023, 8:58 AM  Transition of Care Department [facility]) has reviewed patient and no TOC needs have been identified at this time. We will continue to monitor patient advancement through interdisciplinary progression rounds. If new patient transition needs arise, please place a TOC consult.

## 2023-01-22 ENCOUNTER — Telehealth: Payer: Self-pay

## 2023-01-22 NOTE — Transitions of Care (Post Inpatient/ED Visit) (Signed)
   01/22/2023  Name: Sandra Vance MRN: [id number] DOB: 12/20/2000  Today's TOC FU Call Status: Today's TOC FU Call Status:: Successful TOC FU Call Competed TOC FU Call Complete Date: 01/22/23  Transition Care Management Follow-up Telephone Call Date of Discharge: 01/21/23 Discharge Facility: [facility]) Type of Discharge: Inpatient Admission Primary Inpatient Discharge Diagnosis:: Sepsis secondary to UTI How have you been since you were released from the hospital?: Better Any questions or concerns?: No  Items Reviewed: Did you receive and understand the discharge instructions provided?: Yes Medications obtained and verified?: Yes (Medications Reviewed) Any new allergies since your discharge?: No Dietary orders reviewed?: NA Do you have support at home?: Yes People in Home: significant other  Home Care and Equipment/Supplies: [place] Ordered?: NA Any new equipment or medical supplies ordered?: NA  Functional Questionnaire: Do you need assistance with bathing/showering or dressing?: No Do you need assistance with meal preparation?: No Do you need assistance with eating?: No Do you have difficulty maintaining continence: No Do you need assistance with getting out of bed/getting out of a chair/moving?: No Do you have difficulty managing or taking your medications?: No  Follow up appointments reviewed: PCP Follow-up appointment confirmed?: No (needs followup, but no open appts-message sent to provider.) MD Provider Line Number:[phone] Given: Yes [facility] Follow-up appointment confirmed?: NA Do you need transportation to your follow-up appointment?: No Do you understand care options if your condition(s) worsen?: Yes-patient verbalized understanding    Norton Blizzard, [place] ([place])  [place] [phone]

## 2023-01-24 LAB — CULTURE, BLOOD (ROUTINE X 2)
Culture: NO GROWTH
Culture: NO GROWTH
Special Requests: ADEQUATE

## 2023-01-31 ENCOUNTER — Encounter: Payer: Self-pay | Admitting: Family Medicine

## 2023-01-31 ENCOUNTER — Ambulatory Visit: Payer: 59 | Admitting: Family Medicine

## 2023-01-31 VITALS — BP 108/71 | HR 79 | Temp 98.1°F | Resp 16 | Wt 102.2 lb

## 2023-01-31 DIAGNOSIS — Z0289 Encounter for other administrative examinations: Secondary | ICD-10-CM | POA: Diagnosis not present

## 2023-01-31 DIAGNOSIS — N390 Urinary tract infection, site not specified: Secondary | ICD-10-CM

## 2023-01-31 DIAGNOSIS — A419 Sepsis, unspecified organism: Secondary | ICD-10-CM | POA: Diagnosis not present

## 2023-01-31 DIAGNOSIS — Z09 Encounter for follow-up examination after completed treatment for conditions other than malignant neoplasm: Secondary | ICD-10-CM

## 2023-01-31 NOTE — Progress Notes (Signed)
Patient is here for completion of paper for [facility]

## 2023-02-04 ENCOUNTER — Encounter (HOSPITAL_COMMUNITY): Payer: Self-pay

## 2023-02-04 ENCOUNTER — Emergency Department (HOSPITAL_COMMUNITY)
Admission: EM | Admit: 2023-02-04 | Discharge: 2023-02-04 | Disposition: A | Discharge status: Home / Self Care | Payer: 59 | Attending: Emergency Medicine | Admitting: Emergency Medicine

## 2023-02-04 DIAGNOSIS — J45909 Unspecified asthma, uncomplicated: Secondary | ICD-10-CM | POA: Insufficient documentation

## 2023-02-04 DIAGNOSIS — Z1152 Encounter for screening for COVID-19: Secondary | ICD-10-CM | POA: Insufficient documentation

## 2023-02-04 DIAGNOSIS — B9789 Other viral agents as the cause of diseases classified elsewhere: Secondary | ICD-10-CM | POA: Diagnosis not present

## 2023-02-04 DIAGNOSIS — J069 Acute upper respiratory infection, unspecified: Secondary | ICD-10-CM | POA: Diagnosis not present

## 2023-02-04 DIAGNOSIS — R0981 Nasal congestion: Secondary | ICD-10-CM | POA: Diagnosis present

## 2023-02-04 LAB — SARS CORONAVIRUS 2 BY RT PCR: SARS Coronavirus 2 by RT PCR: POSITIVE — AB

## 2023-02-04 MED ORDER — CEPACOL 15-2.3 MG MT LOZG: 1.0000 | LOZENGE | Freq: Four times a day (QID) | OROMUCOSAL | 0 refills | Status: DC

## 2023-02-04 NOTE — Discharge Instructions (Addendum)
You were seen in the emergency department today for sore throat and body aches. You likely have a viral infection. Please follow-up in your MyChart for your COVID swab results. Please use tylenol and motrin for fever and body ache relief. Eat a gentle diet. Drink plenty of fluids. Rest. Please return for significantly worsening symptoms.

## 2023-02-04 NOTE — ED Triage Notes (Signed)
Pt c/o runny nose and generalized body aches x 2 days. Denies seasonal allergies.

## 2023-02-04 NOTE — ED Provider Notes (Signed)
[place] EMERGENCY DEPARTMENT AT [facility] Provider Note   CSN: [id number] Arrival date & time: 02/04/23  3748     History  Chief Complaint  Patient presents with   Nasal Congestion    Sandra Vance is a 22 y.o. female.  With past medical history of asthma, GERD who presents to the emergency department with nasal congestion.  Patient states that on Friday she began having sore throat.  States that over the weekend she then started having bodyaches and rhinorrhea.  She states that at work there are multiple people with similar symptoms.  She has not taken her temperature but feels like she has had a fever.  She denies having cough or shortness of breath, nausea or vomiting, chest pain, sinus pain or pressure, headache.  HPI     Home Medications Prior to Admission medications   Medication Sig Start Date End Date Taking? Authorizing Provider  Benzocaine-Menthol (CEPACOL) 15-2.3 MG LOZG Use as directed 1 lozenge in the mouth or throat in the morning, at noon, in the evening, and at bedtime. 02/04/23  Yes Cristopher Peru, PA-C  cyclobenzaprine (FLEXERIL) 5 MG tablet Take 1 tablet (5 mg total) by mouth at bedtime as needed for muscle spasms. 07/12/22   Rice, Jamesetta Orleans, MD  hydrocortisone (ANUSOL-HC) 2.5 % rectal cream Place 1 application. rectally 2 (two) times daily as needed for hemorrhoids. Patient not taking: Reported on 01/19/2023 01/09/22   Doree Albee, PA-C  hydroxychloroquine (PLAQUENIL) 200 MG tablet Take 1 tablet (200 mg total) by mouth daily. 02/07/22   Fuller Plan, MD  Rectal Protectant-Emollient (CALMOL-4) 76-10 % SUPP Use as directed once to twice daily Patient not taking: Reported on 01/19/2023 02/28/22   Benancio Deeds, MD  triamcinolone cream (KENALOG) 0.1 % Apply 1 Application topically 2 (two) times daily. Patient not taking: Reported on 01/19/2023 07/09/22   Georganna Skeans, MD      Allergies    Shellfish-derived products and  Raspberry    Review of Systems   Review of Systems  HENT:  Positive for rhinorrhea and sore throat.   Musculoskeletal:  Positive for myalgias.  All other systems reviewed and are negative.   Physical Exam Updated Vital Signs BP 102/69   Pulse 93   Temp 98.6 F (37 C) (Oral)   Resp 16   Ht 4\' 11"  (1.499 m)   Wt 46.3 kg   LMP 01/02/2023 (Exact Date)   SpO2 99%   BMI 20.60 kg/m  Physical Exam Vitals and nursing note reviewed.  Constitutional:      Appearance: Normal appearance.  HENT:     Head: Normocephalic.     Nose: Rhinorrhea present.     Mouth/Throat:     Mouth: Mucous membranes are moist.     Pharynx: Oropharynx is clear. Posterior oropharyngeal erythema present. No oropharyngeal exudate.  Eyes:     General: No scleral icterus.    Extraocular Movements: Extraocular movements intact.  Cardiovascular:     Rate and Rhythm: Normal rate and regular rhythm.     Pulses: Normal pulses.  Pulmonary:     Effort: Pulmonary effort is normal. No respiratory distress.     Breath sounds: Normal breath sounds. No wheezing.  Abdominal:     General: Bowel sounds are normal.     Palpations: Abdomen is soft.  Musculoskeletal:     Cervical back: Neck supple.  Lymphadenopathy:     Cervical: No cervical adenopathy.  Skin:    General:  Skin is warm and dry.     Capillary Refill: Capillary refill takes less than 2 seconds.  Neurological:     General: No focal deficit present.     Mental Status: She is alert and oriented to person, place, and time.  Psychiatric:        Mood and Affect: Mood normal.        Behavior: Behavior normal.     ED Results / Procedures / Treatments   Labs (all labs ordered are listed, but only abnormal results are displayed) Labs Reviewed  SARS CORONAVIRUS 2 BY RT PCR    EKG None  Radiology No results found.  Procedures Procedures   Medications Ordered in ED Medications - No data to display  ED Course/ Medical Decision Making/ A&P     Medical Decision Making Risk OTC drugs.  Initial Impression and Ddx 22 year old female who presents to the emergency department with sore throat Patient PMH that increases complexity of ED encounter: Asthma Differential: Viral upper respiratory infection, pneumonia, strep pharyngitis  Interpretation of Diagnostics I independent reviewed and interpreted the labs as followed: COVID swab pending  - I independently visualized the following imaging with scope of interpretation limited to determining acute life threatening conditions related to emergency care: Not indicated  Patient Reassessment and Ultimate Disposition/Management 22 year old female who presents to the emergency department with sore throat.  She is overall well-appearing, nonseptic and nontoxic appearing.  She is afebrile.  No acute respiratory distress.  Her lungs are clear bilaterally without hypoxia.  Euvolemic appearing.  She has some oropharyngeal erythema without tonsillar swelling or exudates.  Patient presents with symptoms most consistent with viral upper respiratory infection. Based on history and physical doubt sinusitis.  COVID swab is pending at time of discharge. Doubt underlying cardiopulmonary process.  Considered and doubt heart failure exacerbation, COPD or asthma.  No indication for chest x-ray.  We discussed following up in MyChart with her COVID swab.  She is agreeable to this.  Will provide her with a work note.  She is instructed to use Tylenol and NSAIDs for body aches and fever relief.  Instructed to drink plenty of fluids, rest, gentle diet.  Given return precautions for shortness of breath.  The patient has been appropriately medically screened and/or stabilized in the ED. I have low suspicion for any other emergent medical condition which would require further screening, evaluation or treatment in the ED or require inpatient management. At time of discharge the patient is hemodynamically stable  and in no acute distress. I have discussed work-up results and diagnosis with patient and answered all questions. Patient is agreeable with discharge plan. We discussed strict return precautions for returning to the emergency department and they verbalized understanding.     Patient management required discussion with the following services or consulting groups:  None  Complexity of Problems Addressed Acute uncomplicated illness or injury with no diagnostics  Additional Data Reviewed and Analyzed Further history obtained from: Past medical history and medications listed in the EMR and Care Everywhere  Patient Encounter Risk Assessment Prescriptions  Final Clinical Impression(s) / ED Diagnoses Final diagnoses:  Viral upper respiratory tract infection    Rx / DC Orders ED Discharge Orders          Ordered    Benzocaine-Menthol (CEPACOL) 15-2.3 MG LOZG  4 times daily        02/04/23 0928              Cristopher Peru, PA-C 02/04/23  6578    Wynetta Fines, MD 02/05/23 0700

## 2023-02-05 ENCOUNTER — Encounter: Payer: Self-pay | Admitting: Family Medicine

## 2023-02-05 NOTE — Progress Notes (Signed)
Established Patient Office Visit  Subjective Sandra Vance: Sandra Vance, female    DOB: 2001-07-19  Age: 22 y.o. MRN: [id number]  CC: No chief complaint on file.   HPI Sandra Vance presents for hospital follow up where she was admitted for sepsis 2/2 UTI. She reports improvement. She also needs completion of FMLA forms.    Outpatient Encounter Medications as of 01/31/2023  Medication Sig   cyclobenzaprine (FLEXERIL) 5 MG tablet Take 1 tablet (5 mg total) by mouth at bedtime as needed for muscle spasms.   hydroxychloroquine (PLAQUENIL) 200 MG tablet Take 1 tablet (200 mg total) by mouth daily.   hydrocortisone (ANUSOL-HC) 2.5 % rectal cream Place 1 application. rectally 2 (two) times daily as needed for hemorrhoids. (Patient not taking: Reported on 01/19/2023)   Rectal Protectant-Emollient (CALMOL-4) 76-10 % SUPP Use as directed once to twice daily (Patient not taking: Reported on 01/19/2023)   triamcinolone cream (KENALOG) 0.1 % Apply 1 Application topically 2 (two) times daily. (Patient not taking: Reported on 01/19/2023)   No facility-administered encounter medications on file as of 01/31/2023.    Past Medical History:  Diagnosis Date   Asthma    Constipation     No past surgical history on file.  Family History  Problem Relation Age of Onset   Lupus Mother    Asthma Mother    Irritable bowel syndrome Mother    Healthy Father    Autoimmune disease Sister    Asthma Sister    ADD / ADHD Sister    ADD / ADHD Sister    Asthma Sister    Asthma Brother    Sickle cell anemia Maternal Grandmother    Diabetes Maternal Grandfather    Prostate cancer Paternal Grandfather    Diabetes Maternal Aunt    Lupus Paternal Aunt    Fibromyalgia Paternal Aunt    Colon cancer Maternal Great-grandfather    Breast cancer Maternal Great-grandmother    Esophageal cancer Neg Hx    Stomach cancer Neg Hx    Pancreatic cancer Neg Hx    Colon polyps Neg Hx     Social History    Socioeconomic History   Marital status: Significant Other    Spouse name: Not on file   Number of children: Not on file   Years of education: Not on file   Highest education level: Some college, no degree  Occupational History   Occupation: Web designer: [employer]  Tobacco Use   Smoking status: Never    Passive exposure: Never   Smokeless tobacco: Never  Vaping Use   Vaping Use: Every day   Substances: Nicotine, CBD, Flavoring  Substance and Sexual Activity   Alcohol use: Yes    Comment: occasionally   Drug use: No   Sexual activity: Yes    Birth control/protection: Pill, OCP  Other Topics Concern   Not on file  Social History Narrative   Not on file   Social Determinants of Health   Financial Resource Strain: Medium Risk (01/29/2023)   Overall Financial Resource Strain (CARDIA)    Difficulty of Paying Living Expenses: Somewhat hard  Food Insecurity: No Food Insecurity (01/29/2023)   Hunger Vital Sign    Worried About Running Out of Food in the Last Year: Never true    Ran Out of Food in the Last Year: Never true  Transportation Needs: No Transportation Needs (01/29/2023)   PRAPARE - Transportation    Lack  of Transportation (Medical): No    Lack of Transportation (Non-Medical): No  Physical Activity: Sufficiently Active (01/29/2023)   Exercise Vital Sign    Days of Exercise per Week: 2 days    Minutes of Exercise per Session: 90 min  Stress: No Stress Concern Present (01/29/2023)   [employer] of Occupational Health - Occupational Stress Questionnaire    Feeling of Stress : Not at all  Social Connections: Unknown (01/29/2023)   Social Connection and Isolation Panel [NHANES]    Frequency of Communication with Friends and Family: Twice a week    Frequency of Social Gatherings with Friends and Family: Once a week    Attends Religious Services: 1 to 4 times per year    Active Member of [employer] or Organizations: No    Attends Banker  Meetings: Not on file    Marital Status: Patient declined  Intimate Partner Violence: Not At Risk (01/19/2023)   Humiliation, Afraid, Rape, and Kick questionnaire    Fear of Current or Ex-Partner: No    Emotionally Abused: No    Physically Abused: No    Sexually Abused: No    Review of Systems  All other systems reviewed and are negative.       Objective    BP 108/71   Pulse 79   Temp 98.1 F (36.7 C) (Oral)   Resp 16   Wt 102 lb 3.2 oz (46.4 kg)   LMP 01/02/2023 (Exact Date)   SpO2 98%   BMI 20.64 kg/m   Physical Exam Vitals and nursing note reviewed.  Constitutional:      General: She is not in acute distress. Cardiovascular:     Rate and Rhythm: Normal rate and regular rhythm.  Pulmonary:     Effort: Pulmonary effort is normal.     Breath sounds: Normal breath sounds.  Abdominal:     Palpations: Abdomen is soft.     Tenderness: There is no abdominal tenderness.  Neurological:     General: No focal deficit present.     Mental Status: She is alert and oriented to person, place, and time.         Assessment & Plan:   1. Sepsis secondary to UTI Improving daily per patient  2. Hospital discharge follow-up   3. Encounter for completion of form with patient FMLA paper completed    No follow-ups on file.   Tommie Raymond, MD

## 2023-06-07 ENCOUNTER — Ambulatory Visit (INDEPENDENT_AMBULATORY_CARE_PROVIDER_SITE_OTHER): Payer: 59 | Admitting: Podiatry

## 2023-06-07 ENCOUNTER — Ambulatory Visit (INDEPENDENT_AMBULATORY_CARE_PROVIDER_SITE_OTHER): Payer: 59

## 2023-06-07 ENCOUNTER — Encounter: Payer: Self-pay | Admitting: Podiatry

## 2023-06-07 DIAGNOSIS — M7751 Other enthesopathy of right foot: Secondary | ICD-10-CM

## 2023-06-07 DIAGNOSIS — M76821 Posterior tibial tendinitis, right leg: Secondary | ICD-10-CM | POA: Diagnosis not present

## 2023-06-07 MED ORDER — TRIAMCINOLONE ACETONIDE 10 MG/ML IJ SUSP
10.0000 mg | Freq: Once | INTRAMUSCULAR | Status: AC
Start: 2023-06-07 — End: 2023-06-07
  Administered 2023-06-07: 10 mg via INTRA_ARTICULAR

## 2023-06-10 NOTE — Progress Notes (Signed)
Subjective:   Patient ID: Sandra Vance, female   DOB: 22 y.o.   MRN: [id number]   HPI Patient presents with pain on the inside of the right ankle stated she did good for about 6 months and orthotics have been beneficial   ROS      Objective:  Physical Exam  Neurovascular status intact with the patient found to have inflammation of the posterior tibial tendon as it comes under medial malleolus inserts into the navicular     Assessment:  Reoccurrence posterior tibial tendinitis right with 6 months of improvement from orthotics medication     Plan:  H&P reviewed did discuss someday we may need to get MRI but we will continue to try conservative and I did sterile prep and I reinjected the sheath of the tendon carefully after explaining risk 3 mg dexamethasone Kenalog 5 mg Xylocaine and advised on continued orthotic therapy.  Reappoint as needed

## 2023-06-12 ENCOUNTER — Other Ambulatory Visit: Payer: Self-pay

## 2023-06-12 ENCOUNTER — Emergency Department (HOSPITAL_COMMUNITY): Payer: 59

## 2023-06-12 ENCOUNTER — Ambulatory Visit (HOSPITAL_COMMUNITY)
Admission: EM | Admit: 2023-06-12 | Discharge: 2023-06-12 | Disposition: A | Discharge status: Home / Self Care | Payer: 59

## 2023-06-12 ENCOUNTER — Emergency Department (EMERGENCY_DEPARTMENT_HOSPITAL): Payer: 59 | Admitting: Anesthesiology

## 2023-06-12 ENCOUNTER — Encounter (HOSPITAL_COMMUNITY): Admission: EM | Disposition: A | Discharge status: Home / Self Care | Payer: Self-pay | Source: Home / Self Care

## 2023-06-12 ENCOUNTER — Encounter (HOSPITAL_COMMUNITY): Payer: Self-pay

## 2023-06-12 ENCOUNTER — Other Ambulatory Visit: Payer: Self-pay | Admitting: Obstetrics and Gynecology

## 2023-06-12 ENCOUNTER — Emergency Department (HOSPITAL_COMMUNITY): Payer: 59 | Admitting: Anesthesiology

## 2023-06-12 DIAGNOSIS — Z3A00 Weeks of gestation of pregnancy not specified: Secondary | ICD-10-CM

## 2023-06-12 DIAGNOSIS — K661 Hemoperitoneum: Secondary | ICD-10-CM | POA: Diagnosis not present

## 2023-06-12 DIAGNOSIS — O209 Hemorrhage in early pregnancy, unspecified: Secondary | ICD-10-CM | POA: Diagnosis not present

## 2023-06-12 DIAGNOSIS — K219 Gastro-esophageal reflux disease without esophagitis: Secondary | ICD-10-CM | POA: Insufficient documentation

## 2023-06-12 DIAGNOSIS — Z01812 Encounter for preprocedural laboratory examination: Secondary | ICD-10-CM

## 2023-06-12 DIAGNOSIS — O26891 Other specified pregnancy related conditions, first trimester: Secondary | ICD-10-CM | POA: Diagnosis present

## 2023-06-12 DIAGNOSIS — O009 Unspecified ectopic pregnancy without intrauterine pregnancy: Secondary | ICD-10-CM

## 2023-06-12 DIAGNOSIS — J45909 Unspecified asthma, uncomplicated: Secondary | ICD-10-CM | POA: Diagnosis not present

## 2023-06-12 DIAGNOSIS — Z3A01 Less than 8 weeks gestation of pregnancy: Secondary | ICD-10-CM

## 2023-06-12 DIAGNOSIS — O26899 Other specified pregnancy related conditions, unspecified trimester: Secondary | ICD-10-CM | POA: Diagnosis not present

## 2023-06-12 DIAGNOSIS — O3480 Maternal care for other abnormalities of pelvic organs, unspecified trimester: Secondary | ICD-10-CM | POA: Diagnosis not present

## 2023-06-12 DIAGNOSIS — O00101 Right tubal pregnancy without intrauterine pregnancy: Secondary | ICD-10-CM | POA: Diagnosis not present

## 2023-06-12 HISTORY — PX: LAPAROSCOPIC UNILATERAL SALPINGECTOMY: SHX5934

## 2023-06-12 LAB — CBC WITH DIFFERENTIAL/PLATELET
Abs Immature Granulocytes: 0.04 10*3/uL (ref 0.00–0.07)
Basophils Absolute: 0.1 10*3/uL (ref 0.0–0.1)
Basophils Relative: 1 %
Eosinophils Absolute: 0 10*3/uL (ref 0.0–0.5)
Eosinophils Relative: 0 %
HCT: 38.4 % (ref 36.0–46.0)
Hemoglobin: 13.6 g/dL (ref 12.0–15.0)
Immature Granulocytes: 0 %
Lymphocytes Relative: 11 %
Lymphs Abs: 1.2 10*3/uL (ref 0.7–4.0)
MCH: 28.6 pg (ref 26.0–34.0)
MCHC: 35.4 g/dL (ref 30.0–36.0)
MCV: 80.8 fL (ref 80.0–100.0)
Monocytes Absolute: 0.6 10*3/uL (ref 0.1–1.0)
Monocytes Relative: 5 %
Neutro Abs: 9.2 10*3/uL — ABNORMAL HIGH (ref 1.7–7.7)
Neutrophils Relative %: 83 %
Platelets: 274 10*3/uL (ref 150–400)
RBC: 4.75 MIL/uL (ref 3.87–5.11)
RDW: 12.8 % (ref 11.5–15.5)
WBC: 11.2 10*3/uL — ABNORMAL HIGH (ref 4.0–10.5)
nRBC: 0 % (ref 0.0–0.2)

## 2023-06-12 LAB — COMPREHENSIVE METABOLIC PANEL
ALT: 12 U/L (ref 0–44)
AST: 24 U/L (ref 15–41)
Albumin: 4.6 g/dL (ref 3.5–5.0)
Alkaline Phosphatase: 67 U/L (ref 38–126)
Anion gap: 9 (ref 5–15)
BUN: 18 mg/dL (ref 6–20)
CO2: 21 mmol/L — ABNORMAL LOW (ref 22–32)
Calcium: 8.9 mg/dL (ref 8.9–10.3)
Chloride: 105 mmol/L (ref 98–111)
Creatinine, Ser: 0.81 mg/dL (ref 0.44–1.00)
GFR, Estimated: >60 mL/min (ref >60)
Glucose, Bld: 76 mg/dL (ref 70–99)
Potassium: 4.1 mmol/L (ref 3.5–5.1)
Sodium: 135 mmol/L (ref 135–145)
Total Bilirubin: 1.2 mg/dL (ref 0.3–1.2)
Total Protein: 8 g/dL (ref 6.5–8.1)

## 2023-06-12 LAB — URINALYSIS, ROUTINE W REFLEX MICROSCOPIC
Bilirubin Urine: NEGATIVE
Glucose, UA: NEGATIVE mg/dL
Ketones, ur: NEGATIVE mg/dL
Leukocytes,Ua: NEGATIVE
Nitrite: NEGATIVE
Protein, ur: NEGATIVE mg/dL
Specific Gravity, Urine: 1.029 (ref 1.005–1.030)
pH: 5 (ref 5.0–8.0)

## 2023-06-12 LAB — HCG, QUANTITATIVE, PREGNANCY: hCG, Beta Chain, Quant, S: 710 m[IU]/mL — ABNORMAL HIGH (ref <5)

## 2023-06-12 LAB — LIPASE, BLOOD: Lipase: 26 U/L (ref 11–51)

## 2023-06-12 LAB — PREGNANCY, URINE: Preg Test, Ur: POSITIVE — AB

## 2023-06-12 SURGERY — SALPINGECTOMY, UNILATERAL, LAPAROSCOPIC
Anesthesia: General

## 2023-06-12 MED ORDER — BUPIVACAINE HCL (PF) 0.25 % IJ SOLN
INTRAMUSCULAR | Status: DC | PRN
  Administered 2023-06-12: 30 mL

## 2023-06-12 MED ORDER — FENTANYL CITRATE (PF) 100 MCG/2ML IJ SOLN: 25.0000 ug | INTRAMUSCULAR | Status: DC | PRN

## 2023-06-12 MED ORDER — SUCCINYLCHOLINE CHLORIDE 200 MG/10ML IV SOSY
PREFILLED_SYRINGE | INTRAVENOUS | Status: DC | PRN
  Administered 2023-06-12: 100 mg via INTRAVENOUS

## 2023-06-12 MED ORDER — LACTATED RINGERS IV SOLN: INTRAVENOUS | Status: DC

## 2023-06-12 MED ORDER — FENTANYL CITRATE (PF) 100 MCG/2ML IJ SOLN
INTRAMUSCULAR | Status: DC | PRN
  Administered 2023-06-12: 100 ug via INTRAVENOUS
  Administered 2023-06-12 (×2): 50 ug via INTRAVENOUS

## 2023-06-12 MED ORDER — ONDANSETRON HCL 4 MG/2ML IJ SOLN
INTRAMUSCULAR | Status: DC | PRN
  Administered 2023-06-12: 4 mg via INTRAVENOUS

## 2023-06-12 MED ORDER — PROPOFOL 10 MG/ML IV BOLUS
INTRAVENOUS | Status: AC
  Filled 2023-06-12: qty 20

## 2023-06-12 MED ORDER — MIDAZOLAM HCL 5 MG/5ML IJ SOLN
INTRAMUSCULAR | Status: DC | PRN
  Administered 2023-06-12: 2 mg via INTRAVENOUS

## 2023-06-12 MED ORDER — POVIDONE-IODINE 10 % EX SWAB: 2.0000 | Freq: Once | CUTANEOUS | Status: DC

## 2023-06-12 MED ORDER — ACETAMINOPHEN 10 MG/ML IV SOLN
INTRAVENOUS | Status: AC
  Filled 2023-06-12: qty 100

## 2023-06-12 MED ORDER — KETOROLAC TROMETHAMINE 30 MG/ML IJ SOLN
INTRAMUSCULAR | Status: DC | PRN
Start: 2023-06-12 — End: 2023-06-12
  Administered 2023-06-12: 30 mg via INTRAVENOUS

## 2023-06-12 MED ORDER — BUPIVACAINE HCL (PF) 0.25 % IJ SOLN
INTRAMUSCULAR | Status: AC
  Filled 2023-06-12: qty 30

## 2023-06-12 MED ORDER — SUCCINYLCHOLINE CHLORIDE 200 MG/10ML IV SOSY
PREFILLED_SYRINGE | INTRAVENOUS | Status: AC
  Filled 2023-06-12: qty 10

## 2023-06-12 MED ORDER — DEXAMETHASONE SODIUM PHOSPHATE 10 MG/ML IJ SOLN
INTRAMUSCULAR | Status: DC | PRN
  Administered 2023-06-12: 10 mg via INTRAVENOUS

## 2023-06-12 MED ORDER — LIDOCAINE 2% (20 MG/ML) 5 ML SYRINGE
INTRAMUSCULAR | Status: AC
  Filled 2023-06-12: qty 5

## 2023-06-12 MED ORDER — ORAL CARE MOUTH RINSE: 15.0000 mL | Freq: Once | OROMUCOSAL | Status: AC

## 2023-06-12 MED ORDER — ONDANSETRON HCL 4 MG/2ML IJ SOLN
INTRAMUSCULAR | Status: AC
  Filled 2023-06-12: qty 2

## 2023-06-12 MED ORDER — ACETAMINOPHEN 10 MG/ML IV SOLN
1000.0000 mg | Freq: Once | INTRAVENOUS | Status: DC | PRN
  Administered 2023-06-12: 1000 mg via INTRAVENOUS

## 2023-06-12 MED ORDER — SUGAMMADEX SODIUM 200 MG/2ML IV SOLN
INTRAVENOUS | Status: DC | PRN
  Administered 2023-06-12: 200 mg via INTRAVENOUS

## 2023-06-12 MED ORDER — ROCURONIUM BROMIDE 100 MG/10ML IV SOLN
INTRAVENOUS | Status: DC | PRN
  Administered 2023-06-12: 40 mg via INTRAVENOUS

## 2023-06-12 MED ORDER — DEXAMETHASONE SODIUM PHOSPHATE 10 MG/ML IJ SOLN
INTRAMUSCULAR | Status: AC
  Filled 2023-06-12: qty 1

## 2023-06-12 MED ORDER — MIDAZOLAM HCL 2 MG/2ML IJ SOLN
INTRAMUSCULAR | Status: AC
  Filled 2023-06-12: qty 2

## 2023-06-12 MED ORDER — PHENYLEPHRINE HCL (PRESSORS) 10 MG/ML IV SOLN
INTRAVENOUS | Status: DC | PRN
  Administered 2023-06-12: 80 ug via INTRAVENOUS

## 2023-06-12 MED ORDER — SODIUM CHLORIDE 0.9 % IR SOLN
Status: DC | PRN
  Administered 2023-06-12: 3000 mL

## 2023-06-12 MED ORDER — OXYCODONE-ACETAMINOPHEN 5-325 MG PO TABS
1.0000 | ORAL_TABLET | Freq: Four times a day (QID) | ORAL | 0 refills | Status: DC | PRN
Start: 2023-06-12 — End: 2023-08-30

## 2023-06-12 MED ORDER — FENTANYL CITRATE (PF) 250 MCG/5ML IJ SOLN
INTRAMUSCULAR | Status: AC
  Filled 2023-06-12: qty 5

## 2023-06-12 MED ORDER — LACTATED RINGERS IV BOLUS
1000.0000 mL | Freq: Once | INTRAVENOUS | Status: AC
  Administered 2023-06-12: 1000 mL via INTRAVENOUS

## 2023-06-12 MED ORDER — DEXMEDETOMIDINE HCL IN NACL 80 MCG/20ML IV SOLN
INTRAVENOUS | Status: DC | PRN
  Administered 2023-06-12: 8 ug via INTRAVENOUS
  Administered 2023-06-12: 12 ug via INTRAVENOUS
  Administered 2023-06-12: 8 ug via INTRAVENOUS

## 2023-06-12 MED ORDER — IBUPROFEN 600 MG PO TABS: 600.0000 mg | ORAL_TABLET | Freq: Four times a day (QID) | ORAL | 3 refills | Status: DC | PRN

## 2023-06-12 MED ORDER — LIDOCAINE HCL (CARDIAC) PF 100 MG/5ML IV SOSY
PREFILLED_SYRINGE | INTRAVENOUS | Status: DC | PRN
  Administered 2023-06-12: 60 mg via INTRAVENOUS

## 2023-06-12 MED ORDER — PROPOFOL 10 MG/ML IV BOLUS
INTRAVENOUS | Status: DC | PRN
  Administered 2023-06-12: 110 mg via INTRAVENOUS

## 2023-06-12 MED ORDER — CHLORHEXIDINE GLUCONATE 0.12 % MT SOLN
15.0000 mL | Freq: Once | OROMUCOSAL | Status: AC
  Administered 2023-06-12: 15 mL via OROMUCOSAL
  Filled 2023-06-12: qty 15

## 2023-06-12 MED ORDER — FENTANYL CITRATE PF 50 MCG/ML IJ SOSY
50.0000 ug | PREFILLED_SYRINGE | Freq: Once | INTRAMUSCULAR | Status: AC
  Administered 2023-06-12: 50 ug via INTRAVENOUS
  Filled 2023-06-12: qty 1

## 2023-06-12 MED ORDER — SODIUM CHLORIDE 0.9 % IV BOLUS
1000.0000 mL | Freq: Once | INTRAVENOUS | Status: AC
  Administered 2023-06-12: 1000 mL via INTRAVENOUS

## 2023-06-12 MED ORDER — ONDANSETRON HCL 4 MG/2ML IJ SOLN
4.0000 mg | Freq: Once | INTRAMUSCULAR | Status: AC
  Administered 2023-06-12: 4 mg via INTRAVENOUS
  Filled 2023-06-12: qty 2

## 2023-06-12 SURGICAL SUPPLY — 30 items
ADH SKN CLS APL DERMABOND .7 (GAUZE/BANDAGES/DRESSINGS) ×1
DERMABOND ADVANCED .7 DNX12 (GAUZE/BANDAGES/DRESSINGS) ×1 IMPLANT
DRSG OPSITE POSTOP 3X4 (GAUZE/BANDAGES/DRESSINGS) IMPLANT
DURAPREP 26ML APPLICATOR (WOUND CARE) ×1 IMPLANT
ELECT REM PT RETURN 9FT ADLT (ELECTROSURGICAL)
ELECTRODE REM PT RTRN 9FT ADLT (ELECTROSURGICAL) IMPLANT
GLOVE BIOGEL PI IND STRL 6.5 (GLOVE) ×2 IMPLANT
GLOVE BIOGEL PI IND STRL 7.0 (GLOVE) ×2 IMPLANT
GLOVE SURG SS PI 6.5 STRL IVOR (GLOVE) ×1 IMPLANT
GOWN STRL REUS W/ TWL LRG LVL3 (GOWN DISPOSABLE) ×2 IMPLANT
GOWN STRL REUS W/TWL LRG LVL3 (GOWN DISPOSABLE) ×2
IRRIG SUCT STRYKERFLOW 2 WTIP (MISCELLANEOUS) ×1
IRRIGATION SUCT STRKRFLW 2 WTP (MISCELLANEOUS) IMPLANT
KIT PINK PAD W/HEAD ARE REST (MISCELLANEOUS) ×1 IMPLANT
KIT PINK PAD W/HEAD ARM REST (MISCELLANEOUS) ×1 IMPLANT
KIT TURNOVER KIT B (KITS) ×1 IMPLANT
NS IRRIG 1000ML POUR BTL (IV SOLUTION) ×1 IMPLANT
PACK LAPAROSCOPY BASIN (CUSTOM PROCEDURE TRAY) ×1 IMPLANT
PROTECTOR NERVE ULNAR (MISCELLANEOUS) ×2 IMPLANT
SET TUBE SMOKE EVAC HIGH FLOW (TUBING) ×1 IMPLANT
SHEARS HARMONIC ACE PLUS 36CM (ENDOMECHANICALS) IMPLANT
SLEEVE Z-THREAD 5X100MM (TROCAR) ×1 IMPLANT
SUT MNCRL AB 4-0 PS2 18 (SUTURE) ×1 IMPLANT
SUT VICRYL 0 UR6 27IN ABS (SUTURE) ×2 IMPLANT
SYS BAG RETRIEVAL 10MM (BASKET) ×1
SYSTEM BAG RETRIEVAL 10MM (BASKET) IMPLANT
TOWEL GREEN STERILE FF (TOWEL DISPOSABLE) ×2 IMPLANT
TRAY FOLEY W/BAG SLVR 14FR (SET/KITS/TRAYS/PACK) ×1 IMPLANT
TROCAR BALLN 12MMX100 BLUNT (TROCAR) ×1 IMPLANT
TROCAR XCEL NON-BLD 5MMX100MML (ENDOMECHANICALS) ×1 IMPLANT

## 2023-06-12 NOTE — Transfer of Care (Signed)
Immediate Anesthesia Transfer of Care Note  Patient: Sandra Vance  Procedure(s) Performed: LAPAROSCOPIC RIGHT SALPINGECTOMY WITH REMOVAL OF ECTOPIC PREGNANCY  Patient Location: PACU  Anesthesia Type:General  Level of Consciousness: awake, alert , and oriented  Airway & Oxygen Therapy: Patient Spontanous Breathing and Patient connected to nasal cannula oxygen  Post-op Assessment: Report given to RN, Post -op Vital signs reviewed and stable, and Patient moving all extremities  Post vital signs: Reviewed and stable  Last Vitals:  Vitals Value Taken Time  BP 101/73 06/12/23 2100  Temp 36.1 C 06/12/23 2058  Pulse 82 06/12/23 2107  Resp 18 06/12/23 2107  SpO2 93 % 06/12/23 2107  Vitals shown include unfiled device data.  Last Pain:  Vitals:   06/12/23 2058  TempSrc:   PainSc: Asleep      Patients Stated Pain Goal: 0 (06/12/23 1837)  Complications: No notable events documented.

## 2023-06-12 NOTE — Op Note (Signed)
[name] PROCEDURE DATE: 06/12/2023  PREOPERATIVE DIAGNOSIS: Ruptured ectopic pregnancy POSTOPERATIVE DIAGNOSIS: Ruptured right fallopian tube ectopic pregnancy PROCEDURE: Laparoscopic right salpingectomy and removal of ectopic pregnancy SURGEON:  Dr. Catalina Antigua ASSISTANT: none ANESTHESIOLOGIST: Stoltzfus, Nelle Don, DO Anesthesiologist: Atilano Median, DO CRNA: Reine Just, CRNA  INDICATIONS: 22 y.o. No obstetric history on file. at Unknown here for with ruptured ectopic pregnancy. On exam, she had stable vital signs, and an acute abdomen. Hgb  Lab Results  Component Value Date   HGB 13.6 06/12/2023  . Patient was counseled regarding need for laparoscopic salpingectomy. Risks of surgery including bleeding which may require transfusion or reoperation, infection, injury to bowel or other surrounding organs, need for additional procedures including laparotomy and other postoperative/anesthesia complications were explained to patient.  Written informed consent was obtained.  FINDINGS:  Small amount of hemoperitoneum estimated to be about 100 of blood and clots.  Dilated right fallopian tube containing ectopic gestation. Small normal appearing uterus, normal left fallopian tube, right ovary and left ovary.  ANESTHESIA: General INTRAVENOUS FLUIDS: .1000 ml ESTIMATED BLOOD LOSS:  10 mL  URINE OUTPUT: 140 ml SPECIMENS: right fallopian tube containing ectopic gestation COMPLICATIONS: None immediate  PROCEDURE IN DETAIL:  The patient was taken to the operating room where general anesthesia was administered and was found to be adequate.  She was placed in the dorsal lithotomy position, and was prepped and draped in a sterile manner.  A Foley catheter was inserted into her bladder and attached to Pj Zehner drainage and a uterine manipulator was then advanced into the uterus .  After an adequate timeout was performed, attention was then turned to the patient's abdomen where a  10-mm skin incision was made on the umbilical fold.  The fascia was identified, tented up and incised with Mayo scissors. The fascia was tagged with 0- Vicryl. The peritoneum was identified tented up and entered sharply with Metzenbaum scissors.  10-mm trocar and sleeve were then advanced without difficulty into the abdomen where intraabdominal placement was confirmed by the laparoscope. Pneumoperitoneum was achieved with insufflation of CO2 gas. A survey of the patient's pelvis and abdomen revealed the findings as above.  Two 5-mm left lower quadrant ports were placed under direct visualization.  The 5-mm Nezhat suction irrigator was then used to suction the hemoperitoneum and irrigate the pelvis.  Attention was then turned to the right fallopian tube which was grasped and ligated from the underlying mesosalpinx and uterine attachment using the Enseal instrument.  Good hemostasis was noted.  The specimen was placed in an EndoCatch bag and removed from the abdomen intact.  The abdomen was desufflated, and all instruments were removed.  The fascial incision at the 10-mm site was reapproximated with 0 Vicryl figure-of-eight stiches; and all skin incisions were closed with a 3-0 Vicryl subcuticular stitch. The patient tolerated the procedures well.  All instruments, needles, and sponge counts were correct x 2. The patient was taken to the recovery room in stable condition.   The patient will be discharged to home as per PACU criteria.  Routine postoperative instructions given.  She will follow up in the clinic in 2 weeks for postoperative evaluation .

## 2023-06-12 NOTE — H&P (Signed)
Sandra Vance is an 22 y.o. female P0 transferred from [facility] for surgical management of a ruptured ectopic pregnancy. Patient presented with worsening lower abdominal pain which began yesterday evening. Patient reports some nausea and emesis as well.   Pertinent Gynecological History: Menses: regular every month without intermenstrual spotting Contraception: none DES exposure: denies Blood transfusions: none Sexually transmitted diseases: no past history Last pap: abnormal: ASCUS neg HPV  Date: 03/2022 OB History: G1, P0   Menstrual History: Patient's last menstrual period was 05/07/2023.    Past Medical History:  Diagnosis Date   Asthma    Constipation     Past Surgical History:  Procedure Laterality Date   COLONOSCOPY      Family History  Problem Relation Age of Onset   Lupus Mother    Asthma Mother    Irritable bowel syndrome Mother    Healthy Father    Autoimmune disease Sister    Asthma Sister    ADD / ADHD Sister    ADD / ADHD Sister    Asthma Sister    Asthma Brother    Sickle cell anemia Maternal Grandmother    Diabetes Maternal Grandfather    Prostate cancer Paternal Grandfather    Diabetes Maternal Aunt    Lupus Paternal Aunt    Fibromyalgia Paternal Aunt    Colon cancer Maternal Great-grandfather    Breast cancer Maternal Great-grandmother    Esophageal cancer Neg Hx    Stomach cancer Neg Hx    Pancreatic cancer Neg Hx    Colon polyps Neg Hx     Social History:  reports that she has never smoked. She has never been exposed to tobacco smoke. She has never used smokeless tobacco. She reports current alcohol use. She reports that she does not use drugs.  Allergies:  Allergies  Allergen Reactions   Shellfish-Derived Products Swelling   Raspberry Itching and Rash    Medications Prior to Admission  Medication Sig Dispense Refill Last Dose   Benzocaine-Menthol (CEPACOL) 15-2.3 MG LOZG Use as directed 1 lozenge in the mouth or throat in the  morning, at noon, in the evening, and at bedtime. 60 lozenge 0    cyclobenzaprine (FLEXERIL) 5 MG tablet Take 1 tablet (5 mg total) by mouth at bedtime as needed for muscle spasms. 30 tablet 0    hydroxychloroquine (PLAQUENIL) 200 MG tablet Take 1 tablet (200 mg total) by mouth daily. 30 tablet 2     Review of Systems See pertinent in HPI. All other systems reviewed and non contributory  Blood pressure 116/73, pulse 96, temperature 98.6 F (37 C), temperature source Oral, resp. rate 17, height 4\' 11"  (1.499 m), weight 46.7 kg, last menstrual period 05/07/2023, SpO2 100%. Physical Exam GENERAL: Well-developed, well-nourished female in no acute distress.  LUNGS: Clear to auscultation bilaterally.  HEART: Regular rate and rhythm. ABDOMEN: Soft, lower abdominal tenderness, no rebound, no guarding PELVIC: Deferred to OR EXTREMITIES: No cyanosis, clubbing, or edema, 2+ distal pulses.  Results for orders placed or performed during the hospital encounter of 06/12/23 (from the past 24 hour(s))  CBC with Differential     Status: Abnormal   Collection Time: 06/12/23  1:26 PM  Result Value Ref Range   WBC 11.2 (H) 4.0 - 10.5 K/uL   RBC 4.75 3.87 - 5.11 MIL/uL   Hemoglobin 13.6 12.0 - 15.0 g/dL   HCT 47.8 29.5 - 62.1 %   MCV 80.8 80.0 - 100.0 fL   MCH 28.6 26.0 -  34.0 pg   MCHC 35.4 30.0 - 36.0 g/dL   RDW 41.3 24.4 - 01.0 %   Platelets 274 150 - 400 K/uL   nRBC 0.0 0.0 - 0.2 %   Neutrophils Relative % 83 %   Neutro Abs 9.2 (H) 1.7 - 7.7 K/uL   Lymphocytes Relative 11 %   Lymphs Abs 1.2 0.7 - 4.0 K/uL   Monocytes Relative 5 %   Monocytes Absolute 0.6 0.1 - 1.0 K/uL   Eosinophils Relative 0 %   Eosinophils Absolute 0.0 0.0 - 0.5 K/uL   Basophils Relative 1 %   Basophils Absolute 0.1 0.0 - 0.1 K/uL   Immature Granulocytes 0 %   Abs Immature Granulocytes 0.04 0.00 - 0.07 K/uL  Comprehensive metabolic panel     Status: Abnormal   Collection Time: 06/12/23  1:26 PM  Result Value Ref Range    Sodium 135 135 - 145 mmol/L   Potassium 4.1 3.5 - 5.1 mmol/L   Chloride 105 98 - 111 mmol/L   CO2 21 (L) 22 - 32 mmol/L   Glucose, Bld 76 70 - 99 mg/dL   BUN 18 6 - 20 mg/dL   Creatinine, Ser 2.72 0.44 - 1.00 mg/dL   Calcium 8.9 8.9 - 53.6 mg/dL   Total Protein 8.0 6.5 - 8.1 g/dL   Albumin 4.6 3.5 - 5.0 g/dL   AST 24 15 - 41 U/L   ALT 12 0 - 44 U/L   Alkaline Phosphatase 67 38 - 126 U/L   Total Bilirubin 1.2 0.3 - 1.2 mg/dL   GFR, Estimated >64 >40 mL/min   Anion gap 9 5 - 15  Lipase, blood     Status: None   Collection Time: 06/12/23  1:26 PM  Result Value Ref Range   Lipase 26 11 - 51 U/L  hCG, quantitative, pregnancy     Status: Abnormal   Collection Time: 06/12/23  1:26 PM  Result Value Ref Range   hCG, Beta Chain, Quant, S 710 (H) <5 mIU/mL  Urinalysis, Routine w reflex microscopic -Urine, Clean Catch     Status: Abnormal   Collection Time: 06/12/23  1:38 PM  Result Value Ref Range   Color, Urine YELLOW YELLOW   APPearance CLEAR CLEAR   Specific Gravity, Urine 1.029 1.005 - 1.030   pH 5.0 5.0 - 8.0   Glucose, UA NEGATIVE NEGATIVE mg/dL   Hgb urine dipstick LARGE (A) NEGATIVE   Bilirubin Urine NEGATIVE NEGATIVE   Ketones, ur NEGATIVE NEGATIVE mg/dL   Protein, ur NEGATIVE NEGATIVE mg/dL   Nitrite NEGATIVE NEGATIVE   Leukocytes,Ua NEGATIVE NEGATIVE   RBC / HPF 11-20 0 - 5 RBC/hpf   WBC, UA 0-5 0 - 5 WBC/hpf   Bacteria, UA RARE (A) NONE SEEN   Squamous Epithelial / HPF 0-5 0 - 5 /HPF   Mucus PRESENT    Hyaline Casts, UA PRESENT   Pregnancy, urine     Status: Abnormal   Collection Time: 06/12/23  1:38 PM  Result Value Ref Range   Preg Test, Ur POSITIVE (A) NEGATIVE    US OB Transvaginal  Result Date: 06/12/2023 CLINICAL DATA:  Abdominal pain and vaginal bleeding in 1st trimester pregnancy. EXAM: OBSTETRIC <14 WK [place] AND TRANSVAGINAL OB US TECHNIQUE: Both transabdominal and transvaginal ultrasound examinations were performed for complete evaluation of the  gestation as well as the maternal uterus, adnexal regions, and pelvic cul-de-sac. Transvaginal technique was performed to assess early pregnancy. COMPARISON:  None Available. FINDINGS: Intrauterine  gestational sac: None Maternal uterus/adnexae: Retroflexed uterus. Thin endometrium measuring 2 mm. Both ovaries are normal in appearance. A complex hypoechoic lesion is seen in the right adnexa which is separate from the ovary, measuring 3.4 x 2.1 x 2.4 cm. Peristalsing bowel loops also seen in the right adnexa, however this lesion showed no peristalsis. A small amount of complex free fluid is also seen, suspicious for hemoperitoneum. IMPRESSION: No IUP visualized. 3.4 cm complex right adnexal lesion and small amount of complex free fluid, highly suspicious for ectopic pregnancy. Critical Value/emergent results were called by telephone at the time of interpretation on 06/12/2023 at 5:34 pm to provider [facility] , who verbally acknowledged these results. Electronically Signed   By: Danae Orleans M.D.   On: 06/12/2023 17:35   US OB Comp Less 14 Wks  Result Date: 06/12/2023 CLINICAL DATA:  Abdominal pain and vaginal bleeding in 1st trimester pregnancy. EXAM: OBSTETRIC <14 WK [place] AND TRANSVAGINAL OB US TECHNIQUE: Both transabdominal and transvaginal ultrasound examinations were performed for complete evaluation of the gestation as well as the maternal uterus, adnexal regions, and pelvic cul-de-sac. Transvaginal technique was performed to assess early pregnancy. COMPARISON:  None Available. FINDINGS: Intrauterine gestational sac: None Maternal uterus/adnexae: Retroflexed uterus. Thin endometrium measuring 2 mm. Both ovaries are normal in appearance. A complex hypoechoic lesion is seen in the right adnexa which is separate from the ovary, measuring 3.4 x 2.1 x 2.4 cm. Peristalsing bowel loops also seen in the right adnexa, however this lesion showed no peristalsis. A small amount of complex free fluid is also seen,  suspicious for hemoperitoneum. IMPRESSION: No IUP visualized. 3.4 cm complex right adnexal lesion and small amount of complex free fluid, highly suspicious for ectopic pregnancy. Critical Value/emergent results were called by telephone at the time of interpretation on 06/12/2023 at 5:34 pm to provider [facility] , who verbally acknowledged these results. Electronically Signed   By: Danae Orleans M.D.   On: 06/12/2023 17:35    Assessment/Plan: 22 yo with radiologic findings concerning for a ruptured ectopic pregnancy - Discussed surgical management with a laparoscopic salpingectomy. Risks, benefits and alternatives were explained including but not limited to risks of bleeding, infection and damage to adjacent organs. Patient verbalized understanding and all questions were answered  Sandra Vance 06/12/2023, 7:36 PM

## 2023-06-12 NOTE — Anesthesia Preprocedure Evaluation (Signed)
Anesthesia Evaluation  Patient identified by MRN, date of birth, ID band Patient awake    Reviewed: Allergy & Precautions, NPO status , Patient's Chart, lab work & pertinent test results  Airway Mallampati: II  TM Distance: >3 FB Neck ROM: Full    Dental no notable dental hx.    Pulmonary asthma    Pulmonary exam normal        Cardiovascular negative cardio ROS  Rhythm:Regular Rate:Normal     Neuro/Psych negative neurological ROS  negative psych ROS   GI/Hepatic Neg liver ROS,GERD  ,,  Endo/Other  negative endocrine ROS    Renal/GU   negative genitourinary   Musculoskeletal   Abdominal Normal abdominal exam  (+)   Peds  Hematology Lab Results      Component                Value               Date                      WBC                      11.2 (H)            06/12/2023                HGB                      13.6                06/12/2023                HCT                      38.4                06/12/2023                MCV                      80.8                06/12/2023                PLT                      274                 06/12/2023             Lab Results      Component                Value               Date                      NA                       135                 06/12/2023                K                        4.1  06/12/2023                CO2                      21 (L)              06/12/2023                GLUCOSE                  76                  06/12/2023                BUN                      18                  06/12/2023                CREATININE               0.81                06/12/2023                CALCIUM                  8.9                 06/12/2023                GFR                      120.73              01/09/2022                EGFR                     102                 06/13/2022                GFRNONAA                 >60                  06/12/2023              Anesthesia Other Findings   Reproductive/Obstetrics (+) Pregnancy ectopic                             Anesthesia Physical Anesthesia Plan  ASA: 2  Anesthesia Plan: General   Post-op Pain Management:    Induction: Intravenous  PONV Risk Score and Plan: 3 and Ondansetron, Dexamethasone, Midazolam and Treatment may vary due to age or medical condition  Airway Management Planned: Mask and Oral ETT  Additional Equipment: None  Intra-op Plan:   Post-operative Plan: Extubation in OR  Informed Consent: I have reviewed the patients History and Physical, chart, labs and discussed the procedure including the risks, benefits and alternatives for the proposed anesthesia with the patient or authorized representative who has indicated his/her understanding and acceptance.     Dental advisory given  Plan Discussed with: CRNA  Anesthesia Plan Comments:        Anesthesia Quick Evaluation

## 2023-06-12 NOTE — Anesthesia Procedure Notes (Signed)
Procedure Name: Intubation Date/Time: 06/12/2023 7:58 PM  Performed by: Subrena Devereux T, CRNAPre-anesthesia Checklist: Patient identified, Emergency Drugs available, Suction available and Patient being monitored Patient Re-evaluated:Patient Re-evaluated prior to induction Oxygen Delivery Method: Circle system utilized Preoxygenation: Pre-oxygenation with 100% oxygen Induction Type: IV induction and Rapid sequence Ventilation: Mask ventilation without difficulty Laryngoscope Size: Mac and 3 Grade View: Grade I Tube type: Oral Tube size: 7.0 mm Number of attempts: 1 Airway Equipment and Method: Stylet and Oral airway Placement Confirmation: ETT inserted through vocal cords under direct vision, positive ETCO2 and breath sounds checked- equal and bilateral Secured at: 21 cm Tube secured with: Tape Dental Injury: Teeth and Oropharynx as per pre-operative assessment

## 2023-06-12 NOTE — ED Provider Notes (Signed)
[place] EMERGENCY DEPARTMENT AT [facility] Provider Note   CSN: [id number] Arrival date & time: 06/12/23  1256     History  Chief Complaint  Patient presents with   Abdominal Pain    Sandra Vance is a 22 y.o. female.  22 year old female presenting emergency department for abdominal pain, nausea vomiting.  Reports symptoms began last night, gradual onset.  Noted periumbilically into lower abdomen.  Described as crampy.  Had 1 episode of vomiting.  No fevers, chills, rhinorrhea, chest pain, shortness of breath.  Last bowel movement this morning, noted to be diarrhea.  Denies dysuria   Abdominal Pain      Home Medications Prior to Admission medications   Medication Sig Start Date End Date Taking? Authorizing Provider  Benzocaine-Menthol (CEPACOL) 15-2.3 MG LOZG Use as directed 1 lozenge in the mouth or throat in the morning, at noon, in the evening, and at bedtime. 02/04/23   Cristopher Peru, PA-C  cyclobenzaprine (FLEXERIL) 5 MG tablet Take 1 tablet (5 mg total) by mouth at bedtime as needed for muscle spasms. 07/12/22   Rice, Jamesetta Orleans, MD  hydroxychloroquine (PLAQUENIL) 200 MG tablet Take 1 tablet (200 mg total) by mouth daily. 02/07/22   Rice, Jamesetta Orleans, MD      Allergies    Shellfish-derived products and Raspberry    Review of Systems   Review of Systems  Gastrointestinal:  Positive for abdominal pain.    Physical Exam Updated Vital Signs BP 128/89 (BP Location: Right Arm)   Pulse (!) 114   Temp 98.9 F (37.2 C) (Oral)   Resp 16   Ht 4\' 11"  (1.499 m)   Wt 46.7 kg   LMP 06/12/2023   SpO2 100%   BMI 20.80 kg/m  Physical Exam Vitals and nursing note reviewed.  HENT:     Head: Normocephalic.  Cardiovascular:     Rate and Rhythm: Normal rate and regular rhythm.  Pulmonary:     Effort: Pulmonary effort is normal.     Breath sounds: Normal breath sounds.  Abdominal:     General: Abdomen is flat.     Palpations: Abdomen is soft.      Tenderness: There is abdominal tenderness (mild) in the right lower quadrant, periumbilical area, suprapubic area and left lower quadrant.  Skin:    General: Skin is warm.     Capillary Refill: Capillary refill takes less than 2 seconds.  Neurological:     Mental Status: She is alert and oriented to person, place, and time.  Psychiatric:        Mood and Affect: Mood normal.        Behavior: Behavior normal.     ED Results / Procedures / Treatments   Labs (all labs ordered are listed, but only abnormal results are displayed) Labs Reviewed  CBC WITH DIFFERENTIAL/PLATELET - Abnormal; Notable for the following components:      Result Value   WBC 11.2 (*)    Neutro Abs 9.2 (*)    All other components within normal limits  COMPREHENSIVE METABOLIC PANEL - Abnormal; Notable for the following components:   CO2 21 (*)    All other components within normal limits  URINALYSIS, ROUTINE W REFLEX MICROSCOPIC - Abnormal; Notable for the following components:   Hgb urine dipstick LARGE (*)    Bacteria, UA RARE (*)    All other components within normal limits  PREGNANCY, URINE - Abnormal; Notable for the following components:   Preg Test, Ur  POSITIVE (*)    All other components within normal limits  HCG, QUANTITATIVE, PREGNANCY - Abnormal; Notable for the following components:   hCG, Beta Chain, Quant, S 710 (*)    All other components within normal limits  LIPASE, BLOOD    EKG None  Radiology No results found.  Procedures Procedures    Medications Ordered in ED Medications  sodium chloride 0.9 % bolus 1,000 mL (1,000 mLs Intravenous New Bag/Given 06/12/23 1343)  ondansetron (ZOFRAN) injection 4 mg (4 mg Intravenous Given 06/12/23 1342)    ED Course/ Medical Decision Making/ A&P Clinical Course as of 06/12/23 1703  Wed Jun 12, 2023  1316 CT abd 01/19/23 per chart review:"IMPRESSION: 1. Mild to moderately motion degraded exam. 2. No gross explanation for patient's symptoms. 3.  Possible visualization of the appendix. If high clinical concern of appendicitis, consider repeat when patient is able to breath hold and follow directions. 4.  Possible constipation. " [TY]  1535 HCG, Beta Chain, Quant, S(!): 710 [TY]    Clinical Course User Index [TY] Coral Spikes, DO                                 Medical Decision Making This is a 22 year old female presenting emergency department with abdominal pain.  She slightly tachycardic on arrival 114, hemodynamically stable.  Physical exam with some mild lower abdominal tenderness.  No focal peritonitis.  She is clinically well-appearing.  Labs largely reassuring.  She has no anemia.  Mild leukocytosis.  CMP with no significant metabolic derangement.  Normal kidney function.  No transaminitis to suggest hepatobiliary disease.  Lipase negative.  Pancreatitis unlikely.  Patient's pregnancy test did come back positive.  On further history from patient she states her last missed period was last month.  She did also take a Plan B pill late last month as well.  hCG level is at 710.  Patient noted that she has been bleeding heavily for the past 3 days.  No lightheadedness, no dizziness.  Patient is below discriminatory zone, however has had bleeding for some time, will get ultrasound to evaluate for spontaneous abortion versus ectopic versus retained products.  See ED course for final MDM and disposition. Care signed out to afternoon team.   Amount and/or Complexity of Data Reviewed Labs: ordered. Decision-making details documented in ED Course. Radiology: ordered.  Risk Prescription drug management.          Final Clinical Impression(s) / ED Diagnoses Final diagnoses:  None    Rx / DC Orders ED Discharge Orders     None         Coral Spikes, DO 06/12/23 1703

## 2023-06-12 NOTE — ED Provider Notes (Signed)
  Physical Exam  BP 110/69   Pulse (!) 114   Temp 99.5 F (37.5 C) (Oral)   Resp 18   Ht 4\' 11"  (1.499 m)   Wt 46.7 kg   LMP 06/12/2023   SpO2 100%   BMI 20.80 kg/m   Physical Exam  Procedures  Procedures  ED Course / MDM   Clinical Course as of 06/12/23 1746  Wed Jun 12, 2023  1316 CT abd 01/19/23 per chart review:"IMPRESSION: 1. Mild to moderately motion degraded exam. 2. No gross explanation for patient's symptoms. 3. Possible visualization of the appendix. If high clinical concern of appendicitis, consider repeat when patient is able to breath hold and follow directions. 4.  Possible constipation. " [TY]  1535 HCG, Beta Chain, Quant, S(!): 710 [TY]    Clinical Course User Index [TY] Coral Spikes, DO   Medical Decision Making Care assumed at 4 PM from Dr. Maple Hudson.  Patient is here with positive pregnancy test with lower abdominal cramps.  Her hCG is 700.  Signout pending ultrasound to confirm IUP and rule out ectopic  5:46 PM I reviewed patient's ultrasound and patient has a ruptured ectopic.  I discussed case with Dr. Jolayne Panther from OB/GYN.  She states that she will take patient emergently to the OR at [facility].   CRITICAL CARE Performed by: Richardean Canal   Total critical care time: 32 minutes  Critical care time was exclusive of separately billable procedures and treating other patients.  Critical care was necessary to treat or prevent imminent or life-threatening deterioration.  Critical care was time spent personally by me on the following activities: development of treatment plan with patient and/or surrogate as well as nursing, discussions with consultants, evaluation of patient's response to treatment, examination of patient, obtaining history from patient or surrogate, ordering and performing treatments and interventions, ordering and review of laboratory studies, ordering and review of radiographic studies, pulse oximetry and re-evaluation of patient's  condition.   Problems Addressed: Ruptured ectopic pregnancy: acute illness or injury  Amount and/or Complexity of Data Reviewed Labs: ordered. Decision-making details documented in ED Course. Radiology: ordered and independent interpretation performed. Decision-making details documented in ED Course.  Risk Prescription drug management. Decision regarding hospitalization.         Charlynne Pander, MD 06/12/23 [phone]

## 2023-06-12 NOTE — ED Triage Notes (Signed)
Pt coming in complaining of a burning abd pain that refers around the right side to her back. Pt states it began last night, pt endorses nausea, denies any changes to urinary habits.

## 2023-06-12 NOTE — Anesthesia Postprocedure Evaluation (Signed)
Anesthesia Post Note  Patient: Sandra Vance  Procedure(s) Performed: LAPAROSCOPIC RIGHT SALPINGECTOMY WITH REMOVAL OF ECTOPIC PREGNANCY     Patient location during evaluation: PACU Anesthesia Type: General Level of consciousness: awake and alert Pain management: pain level controlled Vital Signs Assessment: post-procedure vital signs reviewed and stable Respiratory status: spontaneous breathing, nonlabored ventilation, respiratory function stable and patient connected to nasal cannula oxygen Cardiovascular status: blood pressure returned to baseline and stable Postop Assessment: no apparent nausea or vomiting Anesthetic complications: no   No notable events documented.  Last Vitals:  Vitals:   06/12/23 2100 06/12/23 2115  BP: 101/73 105/67  Pulse: 82 84  Resp: 19 (!) 21  Temp:  (!) 36.1 C  SpO2: 95% 94%    Last Pain:  Vitals:   06/12/23 2115  TempSrc:   PainSc: 0-No pain                 Nelle Don Kirrah Mustin

## 2023-06-13 ENCOUNTER — Encounter (HOSPITAL_COMMUNITY): Payer: Self-pay | Admitting: Obstetrics and Gynecology

## 2023-06-13 ENCOUNTER — Telehealth: Payer: Self-pay | Admitting: Family Medicine

## 2023-06-13 NOTE — Telephone Encounter (Signed)
Pt said she could just get the paperwork done on the 28th.

## 2023-06-13 NOTE — Telephone Encounter (Signed)
Called patient to let her know we will need to make appointment to get paperwork filled out, pt has appointment scheduled on 8/28 I let patient to call us back and let us know if she would like to get it filled out then or sooner. Place paperwork in brown folder under providers name.

## 2023-06-14 LAB — SURGICAL PATHOLOGY

## 2023-06-19 ENCOUNTER — Encounter: Payer: Self-pay | Admitting: Family Medicine

## 2023-06-19 ENCOUNTER — Ambulatory Visit (INDEPENDENT_AMBULATORY_CARE_PROVIDER_SITE_OTHER): Payer: 59 | Admitting: Family Medicine

## 2023-06-19 VITALS — BP 115/79 | HR 85 | Temp 98.2°F | Resp 16 | Ht 59.0 in | Wt 99.8 lb

## 2023-06-19 DIAGNOSIS — Z130 Encounter for screening for diseases of the blood and blood-forming organs and certain disorders involving the immune mechanism: Secondary | ICD-10-CM | POA: Diagnosis not present

## 2023-06-19 DIAGNOSIS — Z13220 Encounter for screening for lipoid disorders: Secondary | ICD-10-CM | POA: Diagnosis not present

## 2023-06-19 DIAGNOSIS — Z0000 Encounter for general adult medical examination without abnormal findings: Secondary | ICD-10-CM

## 2023-06-19 NOTE — Progress Notes (Unsigned)
Physical for work.  Need paper work filled out

## 2023-06-20 ENCOUNTER — Encounter: Payer: Self-pay | Admitting: Family Medicine

## 2023-06-20 LAB — CBC WITH DIFFERENTIAL/PLATELET
Basophils Absolute: 0 10*3/uL (ref 0.0–0.2)
Basos: 1 %
EOS (ABSOLUTE): 0 10*3/uL (ref 0.0–0.4)
Eos: 0 %
Hematocrit: 39.8 % (ref 34.0–46.6)
Hemoglobin: 13.4 g/dL (ref 11.1–15.9)
Immature Grans (Abs): 0 10*3/uL (ref 0.0–0.1)
Immature Granulocytes: 0 %
Lymphocytes Absolute: 1.3 10*3/uL (ref 0.7–3.1)
Lymphs: 24 %
MCH: 28 pg (ref 26.6–33.0)
MCHC: 33.7 g/dL (ref 31.5–35.7)
MCV: 83 fL (ref 79–97)
Monocytes Absolute: 0.3 10*3/uL (ref 0.1–0.9)
Monocytes: 5 %
Neutrophils Absolute: 3.7 10*3/uL (ref 1.4–7.0)
Neutrophils: 70 %
Platelets: 298 10*3/uL (ref 150–450)
RBC: 4.79 x10E6/uL (ref 3.77–5.28)
RDW: 12.7 % (ref 11.7–15.4)
WBC: 5.3 10*3/uL (ref 3.4–10.8)

## 2023-06-20 LAB — CMP14+EGFR
ALT: 9 IU/L (ref 0–32)
AST: 19 IU/L (ref 0–40)
Albumin: 4.6 g/dL (ref 4.0–5.0)
Alkaline Phosphatase: 77 IU/L (ref 44–121)
BUN/Creatinine Ratio: 18 (ref 9–23)
BUN: 15 mg/dL (ref 6–20)
Bilirubin Total: 0.4 mg/dL (ref 0.0–1.2)
CO2: 21 mmol/L (ref 20–29)
Calcium: 9.5 mg/dL (ref 8.7–10.2)
Chloride: 104 mmol/L (ref 96–106)
Creatinine, Ser: 0.83 mg/dL (ref 0.57–1.00)
Globulin, Total: 2.8 g/dL (ref 1.5–4.5)
Glucose: 82 mg/dL (ref 70–99)
Potassium: 4.5 mmol/L (ref 3.5–5.2)
Sodium: 139 mmol/L (ref 134–144)
Total Protein: 7.4 g/dL (ref 6.0–8.5)
eGFR: 102 mL/min/{1.73_m2} (ref >59)

## 2023-06-20 LAB — LIPID PANEL
Chol/HDL Ratio: 2.7 ratio (ref 0.0–4.4)
Cholesterol, Total: 148 mg/dL (ref 100–199)
HDL: 54 mg/dL (ref >39)
LDL Chol Calc (NIH): 84 mg/dL (ref 0–99)
Triglycerides: 42 mg/dL (ref 0–149)
VLDL Cholesterol Cal: 10 mg/dL (ref 5–40)

## 2023-06-20 NOTE — Progress Notes (Signed)
Established Patient Office Visit  Subjective    Patient ID: Sandra Vance, female    DOB: Oct 27, 2000  Age: 22 y.o. MRN: [id number]  CC:  Chief Complaint  Patient presents with   Annual Exam    Physical for work and need paper work filled out    HPI Sandra Vance presents for routine annual exam. Patient denies acute complaints or concerns.    Outpatient Encounter Medications as of 06/19/2023  Medication Sig   Benzocaine-Menthol (CEPACOL) 15-2.3 MG LOZG Use as directed 1 lozenge in the mouth or throat in the morning, at noon, in the evening, and at bedtime.   cyclobenzaprine (FLEXERIL) 5 MG tablet Take 1 tablet (5 mg total) by mouth at bedtime as needed for muscle spasms.   hydroxychloroquine (PLAQUENIL) 200 MG tablet Take 1 tablet (200 mg total) by mouth daily.   ibuprofen (ADVIL) 600 MG tablet Take 1 tablet (600 mg total) by mouth every 6 (six) hours as needed.   oxyCODONE-acetaminophen (PERCOCET/ROXICET) 5-325 MG tablet Take 1 tablet by mouth every 6 (six) hours as needed.   No facility-administered encounter medications on file as of 06/19/2023.    Past Medical History:  Diagnosis Date   Asthma    Constipation     Past Surgical History:  Procedure Laterality Date   COLONOSCOPY     LAPAROSCOPIC UNILATERAL SALPINGECTOMY N/A 06/12/2023   Procedure: LAPAROSCOPIC RIGHT SALPINGECTOMY WITH REMOVAL OF ECTOPIC PREGNANCY;  Surgeon: Catalina Antigua, MD;  Location: [facility] OR;  Service: Gynecology;  Laterality: N/A;    Family History  Problem Relation Age of Onset   Lupus Mother    Asthma Mother    Irritable bowel syndrome Mother    Healthy Father    Autoimmune disease Sister    Asthma Sister    ADD / ADHD Sister    ADD / ADHD Sister    Asthma Sister    Asthma Brother    Sickle cell anemia Maternal Grandmother    Diabetes Maternal Grandfather    Prostate cancer Paternal Grandfather    Diabetes Maternal Aunt    Lupus Paternal Aunt    Fibromyalgia Paternal Aunt    Colon  cancer Maternal Great-grandfather    Breast cancer Maternal Great-grandmother    Esophageal cancer Neg Hx    Stomach cancer Neg Hx    Pancreatic cancer Neg Hx    Colon polyps Neg Hx     Social History   Socioeconomic History   Marital status: Significant Other    Spouse name: Not on file   Number of children: Not on file   Years of education: Not on file   Highest education level: Some college, no degree  Occupational History   Occupation: Web designer: [employer]  Tobacco Use   Smoking status: Never    Passive exposure: Never   Smokeless tobacco: Never  Vaping Use   Vaping status: Former   Substances: Nicotine, CBD, Flavoring  Substance and Sexual Activity   Alcohol use: Yes    Comment: occasionally   Drug use: No   Sexual activity: Yes    Birth control/protection: Pill, OCP  Other Topics Concern   Not on file  Social History Narrative   Not on file   Social Determinants of Health   Financial Resource Strain: Medium Risk (01/29/2023)   Overall Financial Resource Strain (CARDIA)    Difficulty of Paying Living Expenses: Somewhat hard  Food Insecurity: No Food Insecurity (01/29/2023)   Hunger  Vital Sign    Worried About Programme researcher, broadcasting/film/video in the Last Year: Never true    Ran Out of Food in the Last Year: Never true  Transportation Needs: No Transportation Needs (01/29/2023)   PRAPARE - Administrator, Civil Service (Medical): No    Lack of Transportation (Non-Medical): No  Physical Activity: Sufficiently Active (01/29/2023)   Exercise Vital Sign    Days of Exercise per Week: 2 days    Minutes of Exercise per Session: 90 min  Stress: No Stress Concern Present (01/29/2023)   [employer] of Occupational Health - Occupational Stress Questionnaire    Feeling of Stress : Not at all  Social Connections: Unknown (01/29/2023)   Social Connection and Isolation Panel [NHANES]    Frequency of Communication with Friends and Family: Twice a week     Frequency of Social Gatherings with Friends and Family: Once a week    Attends Religious Services: 1 to 4 times per year    Active Member of [employer] or Organizations: No    Attends Banker Meetings: Not on file    Marital Status: Patient declined  Intimate Partner Violence: Not At Risk (01/19/2023)   Humiliation, Afraid, Rape, and Kick questionnaire    Fear of Current or Ex-Partner: No    Emotionally Abused: No    Physically Abused: No    Sexually Abused: No    Review of Systems  All other systems reviewed and are negative.       Objective    BP 115/79 (BP Location: Right Arm, Patient Position: Sitting, Cuff Size: Normal)   Pulse 85   Temp 98.2 F (36.8 C) (Oral)   Resp 16   Ht 4\' 11"  (1.499 m)   Wt 99 lb 12.8 oz (45.3 kg)   LMP 05/07/2023   SpO2 96%   BMI 20.16 kg/m   Physical Exam Vitals and nursing note reviewed.  Constitutional:      General: She is not in acute distress. HENT:     Head: Normocephalic and atraumatic.     Right Ear: Tympanic membrane, ear canal and external ear normal.     Left Ear: Tympanic membrane, ear canal and external ear normal.     Nose: Nose normal.     Mouth/Throat:     Mouth: Mucous membranes are moist.     Pharynx: Oropharynx is clear.  Eyes:     Conjunctiva/sclera: Conjunctivae normal.     Pupils: Pupils are equal, round, and reactive to light.  Neck:     Thyroid: No thyromegaly.  Cardiovascular:     Rate and Rhythm: Normal rate and regular rhythm.     Heart sounds: Normal heart sounds. No murmur heard. Pulmonary:     Effort: Pulmonary effort is normal. No respiratory distress.     Breath sounds: Normal breath sounds.  Abdominal:     General: There is no distension.     Palpations: Abdomen is soft. There is no mass.     Tenderness: There is no abdominal tenderness.  Musculoskeletal:        General: Normal range of motion.     Cervical back: Normal range of motion and neck supple.  Skin:    General: Skin is  warm and dry.  Neurological:     General: No focal deficit present.     Mental Status: She is alert and oriented to person, place, and time.  Psychiatric:        Mood and  Affect: Mood normal.        Behavior: Behavior normal.         Assessment & Plan:   1. Annual physical exam  - CMP14+EGFR  2. Screening for deficiency anemia  - CBC with Differential  3. Screening for lipid disorders  - Lipid Panel    No follow-ups on file.   Tommie Raymond, MD

## 2023-07-02 ENCOUNTER — Encounter: Payer: Self-pay | Admitting: Obstetrics and Gynecology

## 2023-07-02 ENCOUNTER — Ambulatory Visit (INDEPENDENT_AMBULATORY_CARE_PROVIDER_SITE_OTHER): Payer: 59 | Admitting: Obstetrics and Gynecology

## 2023-07-02 VITALS — BP 114/75 | HR 80 | Wt 101.0 lb

## 2023-07-02 DIAGNOSIS — Z30017 Encounter for initial prescription of implantable subdermal contraceptive: Secondary | ICD-10-CM | POA: Diagnosis not present

## 2023-07-02 DIAGNOSIS — Z3202 Encounter for pregnancy test, result negative: Secondary | ICD-10-CM | POA: Diagnosis not present

## 2023-07-02 DIAGNOSIS — Z01812 Encounter for preprocedural laboratory examination: Secondary | ICD-10-CM

## 2023-07-02 DIAGNOSIS — Z9889 Other specified postprocedural states: Secondary | ICD-10-CM

## 2023-07-02 LAB — POCT URINE PREGNANCY: Preg Test, Ur: NEGATIVE

## 2023-07-02 MED ORDER — ETONOGESTREL 68 MG ~~LOC~~ IMPL
68.0000 mg | DRUG_IMPLANT | Freq: Once | SUBCUTANEOUS | Status: AC
Start: 2023-07-02 — End: 2023-07-02
  Administered 2023-07-02: 68 mg via SUBCUTANEOUS

## 2023-07-02 NOTE — Progress Notes (Signed)
22 yo P0010 here for post op check from a laparoscopic right salpingectomy for the treatment of an ectopic pregnancy. Patient is doing well and has returned to her regular activities. She is scheduled to return to work this weekend. Patient is interested in Nexplanon for contraception. She is without any other complaints  Past Medical History:  Diagnosis Date   Asthma    Constipation    Past Surgical History:  Procedure Laterality Date   COLONOSCOPY     LAPAROSCOPIC UNILATERAL SALPINGECTOMY N/A 06/12/2023   Procedure: LAPAROSCOPIC RIGHT SALPINGECTOMY WITH REMOVAL OF ECTOPIC PREGNANCY;  Surgeon: Catalina Antigua, MD;  Location: [facility] OR;  Service: Gynecology;  Laterality: N/A;   Family History  Problem Relation Age of Onset   Lupus Mother    Asthma Mother    Irritable bowel syndrome Mother    Healthy Father    Autoimmune disease Sister    Asthma Sister    ADD / ADHD Sister    ADD / ADHD Sister    Asthma Sister    Asthma Brother    Sickle cell anemia Maternal Grandmother    Diabetes Maternal Grandfather    Prostate cancer Paternal Grandfather    Diabetes Maternal Aunt    Lupus Paternal Aunt    Fibromyalgia Paternal Aunt    Colon cancer Maternal Great-grandfather    Breast cancer Maternal Great-grandmother    Esophageal cancer Neg Hx    Stomach cancer Neg Hx    Pancreatic cancer Neg Hx    Colon polyps Neg Hx    Social History   Tobacco Use   Smoking status: Never    Passive exposure: Never   Smokeless tobacco: Never  Vaping Use   Vaping status: Former   Substances: Nicotine, CBD, Flavoring  Substance Use Topics   Alcohol use: Yes    Comment: occasionally   Drug use: No   ROS See pertinent in HPI. All other systems reviewed and non contributory Blood pressure 114/75, pulse 80, weight 101 lb (45.8 kg), last menstrual period 05/07/2023.  GENERAL: Well-developed, well-nourished female in no acute distress.  ABDOMEN: Soft, nontender, nondistended. No  organomegaly. INCISION: healing well without erythema, induration or drainage EXTREMITIES: No cyanosis, clubbing, or edema, 2+ distal pulses.  A/P 22 yo here for post op check and nexplanon insertion - Patient is medically cleared to resume all activities of daily living - Reviewed contraception options and patient opted for Nexplanon - Patient given informed consent, signed copy in the chart, time out was performed. Pregnancy test was negative. Appropriate time out taken.  Patient's left arm was prepped and draped in the usual sterile fashion.. The ruler used to measure and mark insertion area.  Patient was prepped with alcohol swab and then injected with 3 cc of 1% lidocaine with epinephrine.  Patient was prepped with betadine, Nexplanon removed form packaging.  Device confirmed in needle, then inserted full length of needle and withdrawn per handbook instructions.  Patient insertion site covered with a Band Aid and pressure bandage.   Minimal blood loss.  Patient tolerated the procedure well.  - Patient is current on pap smear

## 2023-08-30 ENCOUNTER — Ambulatory Visit (HOSPITAL_COMMUNITY)
Admission: RE | Admit: 2023-08-30 | Discharge: 2023-08-30 | Disposition: A | Discharge status: Home / Self Care | Payer: 59 | Source: Ambulatory Visit

## 2023-08-30 ENCOUNTER — Ambulatory Visit (INDEPENDENT_AMBULATORY_CARE_PROVIDER_SITE_OTHER): Payer: 59

## 2023-08-30 ENCOUNTER — Encounter (HOSPITAL_COMMUNITY): Payer: Self-pay

## 2023-08-30 VITALS — BP 108/73 | HR 98 | Temp 98.7°F | Resp 16 | Ht 60.0 in | Wt 99.0 lb

## 2023-08-30 DIAGNOSIS — R0602 Shortness of breath: Secondary | ICD-10-CM | POA: Diagnosis not present

## 2023-08-30 DIAGNOSIS — J189 Pneumonia, unspecified organism: Secondary | ICD-10-CM

## 2023-08-30 DIAGNOSIS — J984 Other disorders of lung: Secondary | ICD-10-CM | POA: Diagnosis not present

## 2023-08-30 DIAGNOSIS — R059 Cough, unspecified: Secondary | ICD-10-CM | POA: Diagnosis not present

## 2023-08-30 MED ORDER — AMOXICILLIN 500 MG PO CAPS: 1000.0000 mg | ORAL_CAPSULE | Freq: Three times a day (TID) | ORAL | 0 refills | Status: AC

## 2023-08-30 MED ORDER — ALBUTEROL SULFATE HFA 108 (90 BASE) MCG/ACT IN AERS: 1.0000 | INHALATION_SPRAY | Freq: Four times a day (QID) | RESPIRATORY_TRACT | 0 refills | Status: AC | PRN

## 2023-08-30 MED ORDER — AZITHROMYCIN 250 MG PO TABS: 250.0000 mg | ORAL_TABLET | Freq: Every day | ORAL | 0 refills | Status: DC

## 2023-08-30 MED ORDER — PREDNISONE 20 MG PO TABS: 40.0000 mg | ORAL_TABLET | Freq: Every day | ORAL | 0 refills | Status: AC

## 2023-08-30 NOTE — ED Provider Notes (Signed)
[facility]    CSN: [id number] Arrival date & time: 08/30/23  1545      History   Chief Complaint Chief Complaint  Patient presents with   Cough    Been having a cough. Chest pains. No appetite. Body chills and sweats for a week - Entered by patient    HPI Sandra Vance is a 22 y.o. female.   Patient presents to clinic for complaints of cough, fever, intermittent chest soreness, chills, sweats, body aches and shortness of breath for the past week.  She had a fever last night of 101.  She denies any sore throat or sinus pressure.  She does have a history of asthma, reports she has not had any recent exacerbations and she has never been hospitalized for her asthma.  She does not have an albuterol inhaler.  She did use a nebulizer treatment last night.     The history is provided by the patient and medical records.  Cough   Past Medical History:  Diagnosis Date   Asthma    Constipation     Patient Active Problem List   Diagnosis Date Noted   Ruptured right tubal ectopic pregnancy causing hemoperitoneum 06/12/2023   Fever 01/19/2023   Tachycardia 01/19/2023   Sepsis secondary to UTI (HCC) 01/19/2023   GERD (gastroesophageal reflux disease) 01/19/2023   Raynaud's syndrome without gangrene 04/09/2022   Upper back pain 02/07/2022   Acute pyelonephritis 06/08/2021   Asthma in adult, mild intermittent, uncomplicated 06/08/2021   Positive ANA (antinuclear antibody) 04/07/2021   Arthralgia 04/07/2021   Weight loss 04/07/2021   Lymphadenopathy of head and neck 04/07/2021   Rash 04/07/2021   Urinary frequency 04/07/2021    Past Surgical History:  Procedure Laterality Date   COLONOSCOPY     LAPAROSCOPIC UNILATERAL SALPINGECTOMY N/A 06/12/2023   Procedure: LAPAROSCOPIC RIGHT SALPINGECTOMY WITH REMOVAL OF ECTOPIC PREGNANCY;  Surgeon: Catalina Antigua, MD;  Location: [facility];  Service: Gynecology;  Laterality: N/A;    OB History   No obstetric history on file.       Home Medications    Prior to Admission medications   Medication Sig Start Date End Date Taking? Authorizing Provider  albuterol (VENTOLIN HFA) 108 (90 Base) MCG/ACT inhaler Inhale 1-2 puffs into the lungs every 6 (six) hours as needed for wheezing or shortness of breath. 08/30/23  Yes Rinaldo Ratel, [place] N, FNP  amoxicillin (AMOXIL) 500 MG capsule Take 2 capsules (1,000 mg total) by mouth 3 (three) times daily for 5 days. 08/30/23 09/04/23 Yes Rinaldo Ratel, [place] N, FNP  azithromycin (ZITHROMAX) 250 MG tablet Take 1 tablet (250 mg total) by mouth daily. Take first 2 tablets together, then 1 every day until finished. 08/30/23  Yes Rinaldo Ratel, [place] N, FNP  etonogestrel (NEXPLANON) 68 MG IMPL implant 1 each by Subdermal route once.   Yes [provider]  predniSONE (DELTASONE) 20 MG tablet Take 2 tablets (40 mg total) by mouth daily for 5 days. 08/30/23 09/04/23 Yes Rinaldo Ratel, [place] N, FNP  hydroxychloroquine (PLAQUENIL) 200 MG tablet Take 1 tablet (200 mg total) by mouth daily. 02/07/22   Rice, Jamesetta Orleans, MD    Family History Family History  Problem Relation Age of Onset   Lupus Mother    Asthma Mother    Irritable bowel syndrome Mother    Healthy Father    Autoimmune disease Sister    Asthma Sister    ADD / ADHD Sister    ADD / ADHD Sister  Asthma Sister    Asthma Brother    Sickle cell anemia Maternal Grandmother    Diabetes Maternal Grandfather    Prostate cancer Paternal Grandfather    Diabetes Maternal Aunt    Lupus Paternal Aunt    Fibromyalgia Paternal Aunt    Colon cancer Maternal Great-grandfather    Breast cancer Maternal Great-grandmother    Esophageal cancer Neg Hx    Stomach cancer Neg Hx    Pancreatic cancer Neg Hx    Colon polyps Neg Hx     Social History Social History   Tobacco Use   Smoking status: Never    Passive exposure: Never   Smokeless tobacco: Never  Vaping Use   Vaping status: Former   Substances: Nicotine, CBD, Flavoring   Substance Use Topics   Alcohol use: Yes    Comment: occasionally   Drug use: No     Allergies   Shellfish-derived products and Raspberry   Review of Systems Review of Systems   Physical Exam Triage Vital Signs ED Triage Vitals  Encounter Vitals Group     BP 08/30/23 1618 108/73     Systolic BP Percentile --      Diastolic BP Percentile --      Pulse Rate 08/30/23 1618 98     Resp 08/30/23 1618 16     Temp 08/30/23 1618 98.7 F (37.1 C)     Temp Source 08/30/23 1618 Oral     SpO2 08/30/23 1618 98 %     Weight 08/30/23 1618 99 lb (44.9 kg)     Height 08/30/23 1618 5' (1.524 m)     Head Circumference --      Peak Flow --      Pain Score 08/30/23 1617 6     Pain Loc --      Pain Education --      Exclude from Growth Chart --    No data found.  Updated Vital Signs BP 108/73 (BP Location: Left Arm)   Pulse 98   Temp 98.7 F (37.1 C) (Oral)   Resp 16   Ht 5' (1.524 m)   Wt 99 lb (44.9 kg)   LMP 07/15/2023 (Exact Date)   SpO2 98%   BMI 19.33 kg/m   Visual Acuity Right Eye Distance:   Left Eye Distance:   Bilateral Distance:    Right Eye Near:   Left Eye Near:    Bilateral Near:     Physical Exam Vitals and nursing note reviewed.  Constitutional:      Appearance: Normal appearance.  HENT:     Head: Normocephalic and atraumatic.     Right Ear: External ear normal.     Left Ear: External ear normal.     Nose: Nose normal.     Mouth/Throat:     Mouth: Mucous membranes are moist.  Eyes:     Conjunctiva/sclera: Conjunctivae normal.  Cardiovascular:     Rate and Rhythm: Normal rate and regular rhythm.     Heart sounds: Normal heart sounds. No murmur heard. Pulmonary:     Effort: Pulmonary effort is normal.     Breath sounds: Decreased air movement present.     Comments: Generalized diminished air movement.  No wheezing or rhonchi auscultated. Musculoskeletal:        General: Normal range of motion.  Skin:    General: Skin is warm and dry.   Neurological:     General: No focal deficit present.     Mental Status:  She is alert.  Psychiatric:        Mood and Affect: Mood normal.        Behavior: Behavior is cooperative.      UC Treatments / Results  Labs (all labs ordered are listed, but only abnormal results are displayed) Labs Reviewed - No data to display  EKG   Radiology No results found.  Procedures Procedures (including critical care time)  Medications Ordered in UC Medications - No data to display  Initial Impression / Assessment and Plan / UC Course  I have reviewed the triage vital signs and the nursing notes.  Pertinent labs & imaging results that were available during my care of the patient were reviewed by me and considered in my medical decision making (see chart for details).  Vitals and triage reviewed, patient is hemodynamically stable.  Lungs are diminished in all fields, no wheezing or rhonchi.  Able to speak in full sentences, oxygenation 98% on room air.  Imaging concerning for left middle lobe pneumonia, covered with amoxicillin and a macrolide for CAP.  Symptomatic management for shortness of breath with albuterol inhaler and prednisone therapy, history of asthma.  Will treat as a mild asthma exacerbation.  Plan of care, follow-up care and return precautions given, no questions at this time.     Final Clinical Impressions(s) / UC Diagnoses   Final diagnoses:  Community acquired pneumonia of left lung, unspecified part of lung     Discharge Instructions      Your chest x-ray was concerning for pneumonia.  Please start the antibiotics tonight, take all antibiotics as prescribed and until finished, you can take them with food to prevent gastrointestinal upset.  You can use your albuterol inhaler as needed for any wheezing or shortness of breath.  For any fevers, body aches or pain you can alternate between 800 mg of ibuprofen and 500 mg of Tylenol every 4-6 hours.  Start the steroids  tomorrow with breakfast.    Your symptoms should improve over the next 72 hours on antibiotics, if no improvement or any changes please return to clinic.       ED Prescriptions     Medication Sig Dispense Auth. Provider   amoxicillin (AMOXIL) 500 MG capsule Take 2 capsules (1,000 mg total) by mouth 3 (three) times daily for 5 days. 30 capsule Rinaldo Ratel, [place] N, [place]   azithromycin (ZITHROMAX) 250 MG tablet Take 1 tablet (250 mg total) by mouth daily. Take first 2 tablets together, then 1 every day until finished. 6 tablet Rinaldo Ratel, [place] N, FNP   albuterol (VENTOLIN HFA) 108 (90 Base) MCG/ACT inhaler Inhale 1-2 puffs into the lungs every 6 (six) hours as needed for wheezing or shortness of breath. 18 g Rinaldo Ratel, [place] N, [place]   predniSONE (DELTASONE) 20 MG tablet Take 2 tablets (40 mg total) by mouth daily for 5 days. 10 tablet Zalia Hautala, [place] N, FNP      PDMP not reviewed this encounter.   Maritza Hosterman, [place] N, [place] 08/30/23 [phone]

## 2023-08-30 NOTE — Discharge Instructions (Addendum)
Your chest x-ray was concerning for pneumonia.  Please start the antibiotics tonight, take all antibiotics as prescribed and until finished, you can take them with food to prevent gastrointestinal upset.  You can use your albuterol inhaler as needed for any wheezing or shortness of breath.  For any fevers, body aches or pain you can alternate between 800 mg of ibuprofen and 500 mg of Tylenol every 4-6 hours.  Start the steroids tomorrow with breakfast.    Your symptoms should improve over the next 72 hours on antibiotics, if no improvement or any changes please return to clinic.

## 2023-08-30 NOTE — ED Triage Notes (Signed)
Patient here today with c/o cough, fever on and off, chest soreness, chills, sweats, body aches, and SOB X 1 week. She has been taking Mucinex with no relief. No sick contacts. No recent travel.

## 2023-12-02 ENCOUNTER — Encounter (HOSPITAL_COMMUNITY): Payer: Self-pay

## 2023-12-02 ENCOUNTER — Ambulatory Visit (HOSPITAL_COMMUNITY)
Admission: RE | Admit: 2023-12-02 | Discharge: 2023-12-02 | Disposition: A | Discharge status: Home / Self Care | Payer: 59 | Source: Ambulatory Visit | Attending: Emergency Medicine | Admitting: Emergency Medicine

## 2023-12-02 VITALS — BP 108/73 | HR 80 | Temp 98.4°F | Resp 18

## 2023-12-02 DIAGNOSIS — R1084 Generalized abdominal pain: Secondary | ICD-10-CM

## 2023-12-02 DIAGNOSIS — K429 Umbilical hernia without obstruction or gangrene: Secondary | ICD-10-CM

## 2023-12-02 LAB — POCT URINALYSIS DIP (MANUAL ENTRY)
Bilirubin, UA: NEGATIVE
Glucose, UA: NEGATIVE mg/dL
Ketones, POC UA: NEGATIVE mg/dL
Leukocytes, UA: NEGATIVE
Nitrite, UA: NEGATIVE
Protein Ur, POC: NEGATIVE mg/dL
Spec Grav, UA: <=1.005 — AB (ref 1.010–1.025)
Urobilinogen, UA: 0.2 U/dL
pH, UA: 5.5 (ref 5.0–8.0)

## 2023-12-02 LAB — POCT URINE PREGNANCY: Preg Test, Ur: NEGATIVE

## 2023-12-02 NOTE — ED Provider Notes (Signed)
 [facility]    CSN: [id number] Arrival date & time: 12/02/23  1450      History   Chief Complaint Chief Complaint  Patient presents with   Abdominal Pain    Been having stomach pain for a month. I think I have a hernia on my stomach - Entered by patient    HPI Sandra Vance is a 23 y.o. female.   23 year old female pt, Sandra Vance, presents to urgent care for valuation of abdominal pain with bloating for months. Patient complaining of a knot above her umbilical area since November 2024, old belly button ring scar noted.  Last normal bowel movement was yesterday,takes laxatives for constipation. Reports "bloating" after eating  PMH: asthma,constipation  The history is provided by the patient. No language interpreter was used.    Past Medical History:  Diagnosis Date   Asthma    Constipation     Patient Active Problem List   Diagnosis Date Noted   Umbilical hernia without obstruction and without gangrene 12/02/2023   Generalized abdominal pain 12/02/2023   Ruptured right tubal ectopic pregnancy causing hemoperitoneum 06/12/2023   Fever 01/19/2023   Tachycardia 01/19/2023   Sepsis secondary to UTI (HCC) 01/19/2023   GERD (gastroesophageal reflux disease) 01/19/2023   Raynaud's syndrome without gangrene 04/09/2022   Upper back pain 02/07/2022   Acute pyelonephritis 06/08/2021   Asthma in adult, mild intermittent, uncomplicated 06/08/2021   Positive ANA (antinuclear antibody) 04/07/2021   Arthralgia 04/07/2021   Weight loss 04/07/2021   Lymphadenopathy of head and neck 04/07/2021   Rash 04/07/2021   Urinary frequency 04/07/2021    Past Surgical History:  Procedure Laterality Date   COLONOSCOPY     LAPAROSCOPIC UNILATERAL SALPINGECTOMY N/A 06/12/2023   Procedure: LAPAROSCOPIC RIGHT SALPINGECTOMY WITH REMOVAL OF ECTOPIC PREGNANCY;  Surgeon: Verlyn Goad, MD;  Location: [facility];  Service: Gynecology;  Laterality: N/A;    OB History   No obstetric  history on file.      Home Medications    Prior to Admission medications   Medication Sig Start Date End Date Taking? Authorizing Provider  albuterol  (VENTOLIN  HFA) 108 (90 Base) MCG/ACT inhaler Inhale 1-2 puffs into the lungs every 6 (six) hours as needed for wheezing or shortness of breath. 08/30/23   Harlow Lighter, [place]  N, FNP  azithromycin  (ZITHROMAX ) 250 MG tablet Take 1 tablet (250 mg total) by mouth daily. Take first 2 tablets together, then 1 every day until finished. 08/30/23   Harlow Lighter, [place]  N, FNP  etonogestrel  (NEXPLANON ) 68 MG IMPL implant 1 each by Subdermal route once.    [provider]  hydroxychloroquine  (PLAQUENIL ) 200 MG tablet Take 1 tablet (200 mg total) by mouth daily. 02/07/22   Rice, Haig Levan, MD    Family History Family History  Problem Relation Age of Onset   Lupus Mother    Asthma Mother    Irritable bowel syndrome Mother    Healthy Father    Autoimmune disease Sister    Asthma Sister    ADD / ADHD Sister    ADD / ADHD Sister    Asthma Sister    Asthma Brother    Sickle cell anemia Maternal Grandmother    Diabetes Maternal Grandfather    Prostate cancer Paternal Grandfather    Diabetes Maternal Aunt    Lupus Paternal Aunt    Fibromyalgia Paternal Aunt    Colon cancer Maternal Great-grandfather    Breast cancer Maternal Great-grandmother    Esophageal cancer Neg  Hx    Stomach cancer Neg Hx    Pancreatic cancer Neg Hx    Colon polyps Neg Hx     Social History Social History   Tobacco Use   Smoking status: Never    Passive exposure: Never   Smokeless tobacco: Never  Vaping Use   Vaping status: Former   Substances: Nicotine, CBD, Flavoring  Substance Use Topics   Alcohol use: Yes    Comment: occasionally   Drug use: No     Allergies   Shellfish-derived products and Raspberry   Review of Systems Review of Systems  Constitutional:  Negative for fever.  Gastrointestinal:  Positive for abdominal distention, abdominal  pain and constipation. Negative for anal bleeding, blood in stool, diarrhea, nausea, rectal pain and vomiting.       "Bloating" after eating  All other systems reviewed and are negative.    Physical Exam Triage Vital Signs ED Triage Vitals  Encounter Vitals Group     BP 12/02/23 1515 108/73     Systolic BP Percentile --      Diastolic BP Percentile --      Pulse Rate 12/02/23 1515 80     Resp 12/02/23 1515 18     Temp 12/02/23 1515 98.4 F (36.9 C)     Temp Source 12/02/23 1515 Oral     SpO2 12/02/23 1515 98 %     Weight --      Height --      Head Circumference --      Peak Flow --      Pain Score 12/02/23 1516 7     Pain Loc --      Pain Education --      Exclude from Growth Chart --    No data found.  Updated Vital Signs BP 108/73 (BP Location: Right Arm)   Pulse 80   Temp 98.4 F (36.9 C) (Oral)   Resp 18   SpO2 98%   Visual Acuity Right Eye Distance:   Left Eye Distance:   Bilateral Distance:    Right Eye Near:   Left Eye Near:    Bilateral Near:     Physical Exam Vitals and nursing note reviewed.  Constitutional:      Appearance: Normal appearance. She is well-developed.  HENT:     Head: Normocephalic.     Right Ear: Tympanic membrane normal.     Left Ear: Tympanic membrane normal.     Nose: Nose normal.     Mouth/Throat:     Pharynx: Uvula midline.  Neck:     Trachea: Trachea normal.  Cardiovascular:     Rate and Rhythm: Normal rate and regular rhythm.     Pulses: Normal pulses.     Heart sounds: Normal heart sounds.  Pulmonary:     Effort: Pulmonary effort is normal.     Breath sounds: Normal breath sounds.  Abdominal:     General: Bowel sounds are increased.     Tenderness: There is no guarding or rebound.     Hernia: A hernia is present. Hernia is present in the umbilical area.     Comments: No abdominal tenderness w palpation  Neurological:     General: No focal deficit present.     Mental Status: She is alert and oriented to  person, place, and time.     GCS: GCS eye subscore is 4. GCS verbal subscore is 5. GCS motor subscore is 6.     Cranial Nerves: No  cranial nerve deficit.     Sensory: No sensory deficit.  Psychiatric:        Attention and Perception: Attention normal.        Mood and Affect: Mood normal.        Speech: Speech normal.        Behavior: Behavior is cooperative.      UC Treatments / Results  Labs (all labs ordered are listed, but only abnormal results are displayed) Labs Reviewed  POCT URINALYSIS DIP (MANUAL ENTRY) - Abnormal; Notable for the following components:      Result Value   Color, UA light yellow (*)    Spec Grav, UA <=1.005 (*)    Blood, UA small (*)    All other components within normal limits  POCT URINE PREGNANCY    EKG   Radiology No results found.  Procedures Procedures (including critical care time)  Medications Ordered in UC Medications - No data to display  Initial Impression / Assessment and Plan / UC Course  I have reviewed the triage vital signs and the nursing notes.  Pertinent labs & imaging results that were available during my care of the patient were reviewed by me and considered in my medical decision making (see chart for details).  Clinical Course as of 12/02/23 1649  Mon Dec 02, 2023  1526 Neg preg test UA is negative for uti [JD]    Clinical Course User Index [JD] Favio Moder, Eveleen Hinds, NP   Discussed exam findings and plan of care with patient, strict go to ER precautions given.   Patient verbalized understanding to this provider.  Ddx: Abdominal pain,umbilical hernia, constipation Final Clinical Impressions(s) / UC Diagnoses   Final diagnoses:  Umbilical hernia without obstruction and without gangrene  Generalized abdominal pain     Discharge Instructions      If you have worsening pain or symptoms go to ER for further evaluation Avoid spicy,greasy fried foods Follow up with PCP need referral to Gi(bloating, constipation)  and surgery(umbilical hernia)       ED Prescriptions   None    PDMP not reviewed this encounter.   Peter Brands, NP 12/02/23 [phone]

## 2023-12-02 NOTE — Discharge Instructions (Signed)
 If you have worsening pain or symptoms go to ER for further evaluation Avoid spicy,greasy fried foods Follow up with PCP need referral to Gi(bloating, constipation) and surgery(umbilical hernia)

## 2023-12-02 NOTE — ED Triage Notes (Signed)
 Pt c/o center abdominal pain with bloating x1 month. Pt c/o a knot above her umbilical area. Last NBM was yesterday.

## 2023-12-19 ENCOUNTER — Ambulatory Visit: Payer: 59 | Admitting: Family Medicine

## 2023-12-19 VITALS — BP 110/77 | HR 93 | Temp 98.9°F | Ht 59.5 in | Wt 96.2 lb

## 2023-12-19 DIAGNOSIS — K921 Melena: Secondary | ICD-10-CM

## 2023-12-19 DIAGNOSIS — R110 Nausea: Secondary | ICD-10-CM | POA: Diagnosis not present

## 2023-12-19 DIAGNOSIS — K439 Ventral hernia without obstruction or gangrene: Secondary | ICD-10-CM | POA: Diagnosis not present

## 2023-12-19 DIAGNOSIS — R109 Unspecified abdominal pain: Secondary | ICD-10-CM

## 2023-12-19 NOTE — Progress Notes (Signed)
 Patient states a hernia on stomach and is wondering what to do next to have it removed.  States stomach pain, blood in stool. Foamuy white stuff in stool  States nausea.

## 2023-12-23 ENCOUNTER — Encounter: Payer: Self-pay | Admitting: Family Medicine

## 2023-12-23 NOTE — Progress Notes (Signed)
 Established Patient Office Visit  Subjective    Patient ID: Sandra Vance, female    DOB: [birth year]  Age: 23 y.o. MRN: [id number]  CC:  Chief Complaint  Patient presents with   Medical Management of Chronic Issues    HPI Sandra Vance presents with complaint of intermittent abdominal pain with  nausea and blood in stool. She also reports a persistent ventral hernia.   Outpatient Encounter Medications as of 12/19/2023  Medication Sig   albuterol (VENTOLIN HFA) 108 (90 Base) MCG/ACT inhaler Inhale 1-2 puffs into the lungs every 6 (six) hours as needed for wheezing or shortness of breath.   etonogestrel (NEXPLANON) 68 MG IMPL implant 1 each by Subdermal route once.   hydroxychloroquine (PLAQUENIL) 200 MG tablet Take 1 tablet (200 mg total) by mouth daily.   azithromycin (ZITHROMAX) 250 MG tablet Take 1 tablet (250 mg total) by mouth daily. Take first 2 tablets together, then 1 every day until finished. (Patient not taking: Reported on 12/19/2023)   No facility-administered encounter medications on file as of 12/19/2023.    Past Medical History:  Diagnosis Date   Asthma    Constipation     Past Surgical History:  Procedure Laterality Date   COLONOSCOPY     LAPAROSCOPIC UNILATERAL SALPINGECTOMY N/A 06/12/2023   Procedure: LAPAROSCOPIC RIGHT SALPINGECTOMY WITH REMOVAL OF ECTOPIC PREGNANCY;  Surgeon: Catalina Antigua, MD;  Location: [facility];  Service: Gynecology;  Laterality: N/A;    Family History  Problem Relation Age of Onset   Lupus Mother    Asthma Mother    Irritable bowel syndrome Mother    Healthy Father    Autoimmune disease Sister    Asthma Sister    ADD / ADHD Sister    ADD / ADHD Sister    Asthma Sister    Asthma Brother    Sickle cell anemia Maternal Grandmother    Diabetes Maternal Grandfather    Prostate cancer Paternal Grandfather    Diabetes Maternal Aunt    Lupus Paternal Aunt    Fibromyalgia Paternal Aunt    Colon cancer Maternal Great-grandfather     Breast cancer Maternal Great-grandmother    Esophageal cancer Neg Hx    Stomach cancer Neg Hx    Pancreatic cancer Neg Hx    Colon polyps Neg Hx     Social History   Socioeconomic History   Marital status: Significant Other    Spouse name: Not on file   Number of children: Not on file   Years of education: Not on file   Highest education level: Some college, no degree  Occupational History   Occupation: Web designer: [employer]  Tobacco Use   Smoking status: Never    Passive exposure: Never   Smokeless tobacco: Never  Vaping Use   Vaping status: Former   Substances: Nicotine, CBD, Flavoring  Substance and Sexual Activity   Alcohol use: Yes    Comment: occasionally   Drug use: No   Sexual activity: Yes    Birth control/protection: OCP, Implant  Other Topics Concern   Not on file  Social History Narrative   Not on file   Social Drivers of Health   Financial Resource Strain: Low Risk  (12/18/2023)   Overall Financial Resource Strain (CARDIA)    Difficulty of Paying Living Expenses: Not very hard  Food Insecurity: Food Insecurity Present (12/18/2023)   Hunger Vital Sign    Worried About Programme researcher, broadcasting/film/video in  the Last Year: Never true    Ran Out of Food in the Last Year: Sometimes true  Transportation Needs: No Transportation Needs (12/18/2023)   PRAPARE - Administrator, Civil Service (Medical): No    Lack of Transportation (Non-Medical): No  Physical Activity: Insufficiently Active (12/18/2023)   Exercise Vital Sign    Days of Exercise per Week: 2 days    Minutes of Exercise per Session: 30 min  Stress: No Stress Concern Present (12/18/2023)   [employer] of Occupational Health - Occupational Stress Questionnaire    Feeling of Stress : Only a little  Social Connections: Unknown (12/18/2023)   Social Connection and Isolation Panel [NHANES]    Frequency of Communication with Friends and Family: Twice a week    Frequency of Social  Gatherings with Friends and Family: Twice a week    Attends Religious Services: Patient declined    Database administrator or Organizations: No    Attends Engineer, structural: Not on file    Marital Status: Never married  Intimate Partner Violence: Not At Risk (01/19/2023)   Humiliation, Afraid, Rape, and Kick questionnaire    Fear of Current or Ex-Partner: No    Emotionally Abused: No    Physically Abused: No    Sexually Abused: No    Review of Systems  Gastrointestinal:  Positive for abdominal pain, blood in stool and nausea. Negative for vomiting.  All other systems reviewed and are negative.       Objective    BP 110/77   Pulse 93   Temp 98.9 F (37.2 C) (Oral)   Ht 4' 11.5" (1.511 m)   Wt 96 lb 3.2 oz (43.6 kg)   SpO2 98%   BMI 19.10 kg/m   Physical Exam Vitals and nursing note reviewed.  Constitutional:      General: She is not in acute distress. Cardiovascular:     Rate and Rhythm: Normal rate and regular rhythm.  Pulmonary:     Effort: Pulmonary effort is normal.     Breath sounds: Normal breath sounds.  Abdominal:     Palpations: Abdomen is soft.     Tenderness: There is no abdominal tenderness.     Hernia: A hernia is present. Hernia is present in the ventral area.  Neurological:     General: No focal deficit present.     Mental Status: She is alert and oriented to person, place, and time.         Assessment & Plan:   Ventral hernia without obstruction or gangrene -     Ambulatory referral to Gastroenterology  Abdominal pain, unspecified abdominal location -     Ambulatory referral to Gastroenterology  Blood in stool -     Ambulatory referral to Gastroenterology  Nausea without vomiting -     Ambulatory referral to Gastroenterology   Referral to GI for further eval/mgt  No follow-ups on file.   Tommie Raymond, MD

## 2023-12-24 ENCOUNTER — Ambulatory Visit (INDEPENDENT_AMBULATORY_CARE_PROVIDER_SITE_OTHER): Payer: 59 | Admitting: Physician Assistant

## 2023-12-24 ENCOUNTER — Encounter: Payer: Self-pay | Admitting: Physician Assistant

## 2023-12-24 VITALS — BP 90/60 | HR 82 | Ht 59.0 in | Wt 95.8 lb

## 2023-12-24 DIAGNOSIS — K439 Ventral hernia without obstruction or gangrene: Secondary | ICD-10-CM

## 2023-12-24 DIAGNOSIS — K648 Other hemorrhoids: Secondary | ICD-10-CM

## 2023-12-24 DIAGNOSIS — K581 Irritable bowel syndrome with constipation: Secondary | ICD-10-CM

## 2023-12-24 DIAGNOSIS — K625 Hemorrhage of anus and rectum: Secondary | ICD-10-CM

## 2023-12-24 MED ORDER — HYDROCORTISONE 2.5 % EX OINT: TOPICAL_OINTMENT | Freq: Two times a day (BID) | CUTANEOUS | 1 refills | Status: AC

## 2023-12-24 MED ORDER — LINACLOTIDE 145 MCG PO CAPS: 145.0000 ug | ORAL_CAPSULE | Freq: Every day | ORAL | 11 refills | Status: AC

## 2023-12-24 NOTE — Progress Notes (Signed)
 Chief Complaint: Follow-up abdominal pain and bloody stool as well as ventral hernia  HPI:    Sandra Vance is a 23 year old African-American female with a past medical history of IBS-C and multiple others, known to Dr. Adela Lank, who was referred to me by Georganna Skeans, MD for a complaint of abdominal pain, ventral hernia bloody stool.    02/28/2022 office visit with Dr. Adela Lank.  At that time recommended a colonoscopy.  Increase MiraLAX to twice daily and given samples of Linzess 290 mcg daily.  Started on Omeprazole 40 mg.    30/30/24 CT the abdomen pelvis with mild to moderately motion degraded exam, possible constipation.    04/04/2022 colonoscopy with tortuous colon, internal hemorrhoids and otherwise normal.  Told to try cannel for suppositories.    06/19/2023 CBC and CMP normal.    Today, the patient tells me that she still has some pain mostly periumbilical which is better after a good bowel movement but she remains constipated most of the time regardless of MiraLAX as needed.  She ran out of Linzess that she was only ever given samples.  Also describes when she passes her stool sometimes there is some bright red blood and oftentimes mucus.    Has history of an ectopic pregnancy and surgery for this, since then has noticed a ventral hernia just above her umbilicus which does hurt at times.    Denies fever, chills or weight loss.  Past Medical History:  Diagnosis Date   Asthma    Constipation     Past Surgical History:  Procedure Laterality Date   COLONOSCOPY     LAPAROSCOPIC UNILATERAL SALPINGECTOMY N/A 06/12/2023   Procedure: LAPAROSCOPIC RIGHT SALPINGECTOMY WITH REMOVAL OF ECTOPIC PREGNANCY;  Surgeon: Catalina Antigua, MD;  Location: [facility] OR;  Service: Gynecology;  Laterality: N/A;    Current Outpatient Medications  Medication Sig Dispense Refill   albuterol (VENTOLIN HFA) 108 (90 Base) MCG/ACT inhaler Inhale 1-2 puffs into the lungs every 6 (six) hours as needed for wheezing or  shortness of breath. 18 g 0   etonogestrel (NEXPLANON) 68 MG IMPL implant 1 each by Subdermal route once.     hydroxychloroquine (PLAQUENIL) 200 MG tablet Take 1 tablet (200 mg total) by mouth daily. 30 tablet 2   No current facility-administered medications for this visit.    Allergies as of 12/24/2023 - Review Complete 12/24/2023  Allergen Reaction Noted   Shellfish-derived products Swelling 11/07/2021   Raspberry Itching and Rash 12/14/2021    Family History  Problem Relation Age of Onset   Lupus Mother    Asthma Mother    Irritable bowel syndrome Mother    Healthy Father    Autoimmune disease Sister    Asthma Sister    ADD / ADHD Sister    ADD / ADHD Sister    Asthma Sister    Asthma Brother    Sickle cell anemia Maternal Grandmother    Diabetes Maternal Grandfather    Prostate cancer Paternal Grandfather    Diabetes Maternal Aunt    Lupus Paternal Aunt    Fibromyalgia Paternal Aunt    Colon cancer Maternal Great-grandfather    Breast cancer Maternal Great-grandmother    Esophageal cancer Neg Hx    Stomach cancer Neg Hx    Pancreatic cancer Neg Hx    Colon polyps Neg Hx     Social History   Socioeconomic History   Marital status: Significant Other    Spouse name: Not on file   Number of  children: Not on file   Years of education: Not on file   Highest education level: Some college, no degree  Occupational History   Occupation: Web designer: [employer]  Tobacco Use   Smoking status: Never    Passive exposure: Never   Smokeless tobacco: Never  Vaping Use   Vaping status: Former   Substances: Nicotine, CBD, Flavoring  Substance and Sexual Activity   Alcohol use: Yes    Comment: occasionally   Drug use: No   Sexual activity: Yes    Birth control/protection: OCP, Implant  Other Topics Concern   Not on file  Social History Narrative   Not on file   Social Drivers of Health   Financial Resource Strain: Low Risk  (12/18/2023)    Overall Financial Resource Strain (CARDIA)    Difficulty of Paying Living Expenses: Not very hard  Food Insecurity: Food Insecurity Present (12/18/2023)   Hunger Vital Sign    Worried About Running Out of Food in the Last Year: Never true    Ran Out of Food in the Last Year: Sometimes true  Transportation Needs: No Transportation Needs (12/18/2023)   PRAPARE - Administrator, Civil Service (Medical): No    Lack of Transportation (Non-Medical): No  Physical Activity: Insufficiently Active (12/18/2023)   Exercise Vital Sign    Days of Exercise per Week: 2 days    Minutes of Exercise per Session: 30 min  Stress: No Stress Concern Present (12/18/2023)   [employer] of Occupational Health - Occupational Stress Questionnaire    Feeling of Stress : Only a little  Social Connections: Unknown (12/18/2023)   Social Connection and Isolation Panel [NHANES]    Frequency of Communication with Friends and Family: Twice a week    Frequency of Social Gatherings with Friends and Family: Twice a week    Attends Religious Services: Patient declined    Database administrator or Organizations: No    Attends Engineer, structural: Not on file    Marital Status: Never married  Intimate Partner Violence: Not At Risk (01/19/2023)   Humiliation, Afraid, Rape, and Kick questionnaire    Fear of Current or Ex-Partner: No    Emotionally Abused: No    Physically Abused: No    Sexually Abused: No    Review of Systems:    Constitutional: No weight loss, fever or chills Skin: No rash  Cardiovascular: No chest pain Respiratory: No SOB Gastrointestinal: See HPI and otherwise negative Genitourinary: No dysuria  Neurological: No headache, dizziness or syncope Musculoskeletal: No new muscle or joint pain Hematologic: No bruising Psychiatric: No history of depression or anxiety   Physical Exam:  Vital signs: BP 90/60   Pulse 82   Ht 4\' 11"  (1.499 m)   Wt 95 lb 12.8 oz (43.5 kg)   BMI  19.35 kg/m   Constitutional:   Pleasant AA female appears to be in NAD, Well developed, Well nourished, alert and cooperative Head:  Normocephalic and atraumatic. Eyes:   PEERL, EOMI. No icterus. Conjunctiva pink. Ears:  Normal auditory acuity. Neck:  Supple Throat: Oral cavity and pharynx without inflammation, swelling or lesion.  Respiratory: Respirations even and unlabored. Lungs clear to auscultation bilaterally.   No wheezes, crackles, or rhonchi.  Cardiovascular: Normal S1, S2. No MRG. Regular rate and rhythm. No peripheral edema, cyanosis or pallor.  Gastrointestinal:  Soft, nondistended, nontender. No rebound or guarding. Normal bowel sounds. No appreciable masses or hepatomegaly. Rectal:  Not performed.  Msk:  Symmetrical without gross deformities. Without edema, no deformity or joint abnormality.  Neurologic:  Alert and  oriented x4;  grossly normal neurologically.  Skin:   Dry and intact without significant lesions or rashes. Psychiatric: Demonstrates good judgement and reason without abnormal affect or behaviors.  RELEVANT LABS AND IMAGING: CBC    Component Value Date/Time   WBC 5.3 06/19/2023 1455   WBC 11.2 (H) 06/12/2023 1326   RBC 4.79 06/19/2023 1455   RBC 4.75 06/12/2023 1326   HGB 13.4 06/19/2023 1455   HCT 39.8 06/19/2023 1455   PLT 298 06/19/2023 1455   MCV 83 06/19/2023 1455   MCH 28.0 06/19/2023 1455   MCH 28.6 06/12/2023 1326   MCHC 33.7 06/19/2023 1455   MCHC 35.4 06/12/2023 1326   RDW 12.7 06/19/2023 1455   LYMPHSABS 1.3 06/19/2023 1455   MONOABS 0.6 06/12/2023 1326   EOSABS 0.0 06/19/2023 1455   BASOSABS 0.0 06/19/2023 1455    CMP     Component Value Date/Time   NA 139 06/19/2023 1455   K 4.5 06/19/2023 1455   CL 104 06/19/2023 1455   CO2 21 06/19/2023 1455   GLUCOSE 82 06/19/2023 1455   GLUCOSE 76 06/12/2023 1326   BUN 15 06/19/2023 1455   CREATININE 0.83 06/19/2023 1455   CREATININE 0.82 04/09/2022 1643   CALCIUM 9.5 06/19/2023 1455    PROT 7.4 06/19/2023 1455   ALBUMIN 4.6 06/19/2023 1455   AST 19 06/19/2023 1455   ALT 9 06/19/2023 1455   ALKPHOS 77 06/19/2023 1455   BILITOT 0.4 06/19/2023 1455   GFRNONAA >60 06/12/2023 1326   GFRAA NOT CALCULATED 05/13/2017 0057    Assessment: 1.  IBS-C: Continues, was better with Linzess in the past, but she ran out of samples 2.  Periumbilical pain: With below/above 3.  Ventral hernia: After surgery for ectopic pregnancy just superior to umbilicus, it is painful at times 4.  Rectal bleeding: Likely from known internal hemorrhoids given ongoing constipation, normal colonoscopy in 2023  Plan: 1.  Discussed with patient that likely her rectal bleeding is from known internal hemorrhoids given normal colonoscopy in 2023. 2.  Prescribed hydrocortisone ointment 2.5% twice daily for 7 to 14 days.  She will apply this to a Preparation H suppository. 3.  Restarted Linzess at 145 mcg daily, 30 minutes before breakfast.  #30 with 11 refills. 4.  Referred the patient to surgical team for possible repair of ventral hernia. 5.  Patient to follow in clinic with [place] in 2 to 3 months or sooner if necessary.  Hyacinth Meeker, PA-C [facility] 12/24/2023, 2:12 PM  Cc: Georganna Skeans, MD

## 2023-12-24 NOTE — Progress Notes (Signed)
 Agree with assessment and plan as outlined.

## 2023-12-24 NOTE — Patient Instructions (Addendum)
 We have sent the following medications to your pharmacy for you to pick up at your convenience: Linzess 145 mg daily 30 minutes before breakfast  Hydrocortisone Ointment 2.5%, apply to an OTC Preparation H suppository for 7 days  Thank you for trusting me with your gastrointestinal care!  Hyacinth Meeker, PA-C  _______________________________________________________  If your blood pressure at your visit was 140/90 or greater, please contact your primary care physician to follow up on this.  _______________________________________________________  If you are age 90 or older, your body mass index should be between 23-30. Your Body mass index is 19.35 kg/m. If this is out of the aforementioned range listed, please consider follow up with your Primary Care Provider.  If you are age 52 or younger, your body mass index should be between 19-25. Your Body mass index is 19.35 kg/m. If this is out of the aformentioned range listed, please consider follow up with your Primary Care Provider.   ________________________________________________________  The [facility] providers would like to encourage you to use [facility] to communicate with providers for non-urgent requests or questions.  Due to long hold times on the telephone, sending your provider a message by [facility] may be a faster and more efficient way to get a response.  Please allow 48 business hours for a response.  Please remember that this is for non-urgent requests.  _______________________________________________________

## 2024-02-11 ENCOUNTER — Other Ambulatory Visit: Payer: Self-pay | Admitting: Surgery

## 2024-02-11 DIAGNOSIS — K581 Irritable bowel syndrome with constipation: Secondary | ICD-10-CM | POA: Diagnosis not present

## 2024-02-11 DIAGNOSIS — K625 Hemorrhage of anus and rectum: Secondary | ICD-10-CM

## 2024-02-11 DIAGNOSIS — K432 Incisional hernia without obstruction or gangrene: Secondary | ICD-10-CM | POA: Diagnosis not present

## 2024-02-11 DIAGNOSIS — K641 Second degree hemorrhoids: Secondary | ICD-10-CM | POA: Insufficient documentation

## 2024-02-11 DIAGNOSIS — K5900 Constipation, unspecified: Secondary | ICD-10-CM | POA: Diagnosis not present

## 2024-02-15 ENCOUNTER — Ambulatory Visit
Admission: RE | Admit: 2024-02-15 | Discharge: 2024-02-15 | Disposition: A | Discharge status: Home / Self Care | Source: Ambulatory Visit | Attending: Surgery | Admitting: Surgery

## 2024-02-15 DIAGNOSIS — K625 Hemorrhage of anus and rectum: Secondary | ICD-10-CM

## 2024-02-15 DIAGNOSIS — K581 Irritable bowel syndrome with constipation: Secondary | ICD-10-CM

## 2024-02-15 DIAGNOSIS — K432 Incisional hernia without obstruction or gangrene: Secondary | ICD-10-CM

## 2024-02-15 DIAGNOSIS — N813 Complete uterovaginal prolapse: Secondary | ICD-10-CM | POA: Diagnosis not present

## 2024-02-15 DIAGNOSIS — K5900 Constipation, unspecified: Secondary | ICD-10-CM

## 2024-02-15 DIAGNOSIS — K641 Second degree hemorrhoids: Secondary | ICD-10-CM

## 2024-03-04 ENCOUNTER — Ambulatory Visit: Payer: Self-pay | Admitting: Surgery

## 2024-03-04 ENCOUNTER — Encounter: Payer: Self-pay | Admitting: Surgery

## 2024-04-17 ENCOUNTER — Ambulatory Visit: Admitting: Podiatry

## 2024-09-09 ENCOUNTER — Ambulatory Visit: Admitting: Podiatry

## 2024-09-28 ENCOUNTER — Other Ambulatory Visit (HOSPITAL_COMMUNITY): Payer: Self-pay
